# Patient Record
Sex: Male | Born: 1945
Health system: Southern US, Community
[De-identification: ages and names within clinical notes are randomized; demographics above are authoritative.]

## PROBLEM LIST (undated history)

## (undated) DIAGNOSIS — J869 Pyothorax without fistula: Secondary | ICD-10-CM

## (undated) DIAGNOSIS — M75100 Unspecified rotator cuff tear or rupture of unspecified shoulder, not specified as traumatic: Secondary | ICD-10-CM

## (undated) DIAGNOSIS — G479 Sleep disorder, unspecified: Secondary | ICD-10-CM

## (undated) DIAGNOSIS — J189 Pneumonia, unspecified organism: Secondary | ICD-10-CM

## (undated) DIAGNOSIS — M1990 Unspecified osteoarthritis, unspecified site: Secondary | ICD-10-CM

## (undated) HISTORY — PX: OTHER SURGICAL HISTORY: SHX169

## (undated) HISTORY — DX: Pyothorax without fistula: J86.9

## (undated) HISTORY — PX: LUNG SURGERY: SHX703

---
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
## 1999-06-22 ENCOUNTER — Encounter: Admission: RE | Admit: 1999-06-22 | Discharge: 1999-06-22 | Payer: Self-pay

## 1999-09-10 ENCOUNTER — Encounter: Payer: Self-pay | Admitting: Neurosurgery

## 1999-09-10 ENCOUNTER — Ambulatory Visit (HOSPITAL_COMMUNITY): Admission: RE | Admit: 1999-09-10 | Discharge: 1999-09-10 | Payer: Self-pay | Admitting: Neurosurgery

## 1999-09-30 ENCOUNTER — Encounter: Payer: Self-pay | Admitting: Neurosurgery

## 1999-10-02 ENCOUNTER — Encounter: Payer: Self-pay | Admitting: Neurosurgery

## 1999-10-02 ENCOUNTER — Inpatient Hospital Stay (HOSPITAL_COMMUNITY): Admission: RE | Admit: 1999-10-02 | Discharge: 1999-10-03 | Payer: Self-pay | Admitting: Neurosurgery

## 1999-10-23 ENCOUNTER — Encounter: Payer: Self-pay | Admitting: Neurosurgery

## 1999-10-23 ENCOUNTER — Encounter: Admission: RE | Admit: 1999-10-23 | Discharge: 1999-10-23 | Payer: Self-pay | Admitting: Neurosurgery

## 1999-12-28 ENCOUNTER — Encounter: Admission: RE | Admit: 1999-12-28 | Discharge: 1999-12-28 | Payer: Self-pay | Admitting: Neurosurgery

## 1999-12-28 ENCOUNTER — Encounter: Payer: Self-pay | Admitting: Neurosurgery

## 2006-08-31 ENCOUNTER — Ambulatory Visit (HOSPITAL_COMMUNITY): Admission: RE | Admit: 2006-08-31 | Discharge: 2006-09-01 | Payer: Self-pay | Admitting: Specialist

## 2008-08-11 ENCOUNTER — Inpatient Hospital Stay (HOSPITAL_COMMUNITY): Admission: EM | Admit: 2008-08-11 | Discharge: 2008-08-23 | Payer: Self-pay | Admitting: Emergency Medicine

## 2008-08-11 ENCOUNTER — Ambulatory Visit: Payer: Self-pay | Admitting: Internal Medicine

## 2008-08-11 ENCOUNTER — Ambulatory Visit: Payer: Self-pay | Admitting: Thoracic Surgery

## 2008-08-12 ENCOUNTER — Encounter (INDEPENDENT_AMBULATORY_CARE_PROVIDER_SITE_OTHER): Payer: Self-pay | Admitting: Emergency Medicine

## 2008-08-15 ENCOUNTER — Encounter: Payer: Self-pay | Admitting: Thoracic Surgery

## 2008-09-03 ENCOUNTER — Ambulatory Visit: Payer: Self-pay | Admitting: Thoracic Surgery

## 2008-09-03 ENCOUNTER — Encounter: Admission: RE | Admit: 2008-09-03 | Discharge: 2008-09-03 | Payer: Self-pay | Admitting: Thoracic Surgery

## 2008-09-26 ENCOUNTER — Ambulatory Visit: Payer: Self-pay | Admitting: Thoracic Surgery

## 2008-09-26 ENCOUNTER — Encounter: Admission: RE | Admit: 2008-09-26 | Discharge: 2008-09-26 | Payer: Self-pay | Admitting: Thoracic Surgery

## 2008-10-01 ENCOUNTER — Ambulatory Visit: Payer: Self-pay | Admitting: Internal Medicine

## 2008-10-01 DIAGNOSIS — J869 Pyothorax without fistula: Secondary | ICD-10-CM

## 2008-10-01 HISTORY — DX: Pyothorax without fistula: J86.9

## 2008-11-06 ENCOUNTER — Encounter: Admission: RE | Admit: 2008-11-06 | Discharge: 2008-11-06 | Payer: Self-pay | Admitting: Thoracic Surgery

## 2008-11-06 ENCOUNTER — Ambulatory Visit: Payer: Self-pay | Admitting: Thoracic Surgery

## 2009-07-30 HISTORY — PX: OTHER SURGICAL HISTORY: SHX169

## 2011-01-12 NOTE — Assessment & Plan Note (Signed)
OFFICE VISIT   OLAF, MESA R  DOB:  05-Dec-1945                                        November 06, 2008  CHART #:  [id number]   The patient came today for followup of his empyema.  His chest x-ray did  show some changes in the right base.  His incisions are well healed.  He  is doing well.  He is back to full activity.  Blood pressure was 113/71,  pulse 64, respirations 18, and sats were 99%.  I will release him back  to his medical doctor and see him again if he has any further pulmonary  problems.   Ines Bloomer, M.D.  Electronically Signed   DPB/MEDQ  D:  11/06/2008  T:  11/07/2008  Job:  [id number]

## 2011-01-12 NOTE — H&P (Signed)
NAMESHUAN, STATZER                ACCOUNT NO.:  [id number]   MEDICAL RECORD NO.:  [id number]          PATIENT TYPE:  INP   LOCATION:  6715                         FACILITY:  [facility]   PHYSICIAN:  Gaspar Garbe, M.D.DATE OF BIRTH:  November 12, 1945   DATE OF ADMISSION:  08/11/2008  DATE OF DISCHARGE:                              HISTORY & PHYSICAL   CHIEF COMPLAINT:  Shortness of breath, chest pain.   HISTORY OF PRESENT ILLNESS:  The patient is a 65 year old white male  with a very unremarkable health history indicates that he wishes to see  Dr. Clelia Croft for a physical in November and was told that he was in good  condition other than that he was overweight.  He had a chest x-ray at  that time which was normal as well.  The patient indicates that he is  having difficulty with chest pain and with some mucus production and  some low grade fevers and chills beginning on Wednesday, seemed to be  getting better as the week went on by just taking Tylenol, however, last  night he started having more significant chest pain and shortness of  breath which has been wrapping up over the course of the past day.  Upon  arrival at the emergency room, he was found to be laboring and short of  breath.  He was placed on 2 L of oxygen and a chest x-ray showed a  consolidated right lower lobe pneumonia with possibility of a left lower  lobe pneumonia as well.  I was asked to admit the patient for therapy.   He indicates that nobody at home has been sick.  No domestic animals and  no travel outside the area, although he is looking to go to [place] for  Christmas but will be driving.   ALLERGIES:  No known drug allergies.   MEDICATIONS:  Fiber, multivitamin, and fish oil.   PAST MEDICAL HISTORY:  Unremarkable.   PAST SURGICAL HISTORY:  The patient had multiple orthopedic procedures  including repair of a neck fracture in 1967, shoulder surgery in 1999,  vertebral fusion in 2001 and another back surgery in  2007.   SOCIAL HISTORY:  The patient lives at [employer] with his wife.  He  is a retired Runner, broadcasting/film/video.  He is married with two children who were grown in  house.  He has one dog.  He is a nonsmoker and nondrinker.   FAMILY HISTORY:  Mother died of old age.  Father died of emphysema.  He  has a brother who died of pulmonary fibrosis, another brother who is  alive.   REVIEW OF SYSTEMS:  The patient complains of fevers, chills, chest pain,  shortness of breath, dyspnea on exertion and cough.  Denies any  headache.  Indicates that he has some sore throat and has been quite  thirsty lately, dry mouth, denies any skin lesions.  Denies any  diarrhea, reflux symptoms or urinary symptoms.  All other systems are  negative on a 10-point scale.   CODE STATUS:  The patient is full code.   PHYSICAL EXAMINATION:  VITAL SIGNS:  Temperature 101.6, pulse 125,  respiratory rate 26, blood pressure 153/77, sating 93% on 2 L.  GENERAL:  The patient is ill-appearing and no acute distress but appears  labored when he is moved.  HEENT:  Normocephalic and atraumatic.  PERRLA.  EOMI.  ENT is within  normal limits excepting the oral mucosa is dry.  NECK:  Supple.  No lymphadenopathy, JVD or bruit appreciated.  HEART:  Tachycardic.  No murmur appreciated.  LUNGS:  The patient has decreased breath sounds of his right base and  slight crackles on the left base.  ABDOMEN:  Soft, nontender, normoactive bowel sounds, no  hepatosplenomegaly.  EXTREMITIES:  No clubbing, cyanosis or edema.  MUSCULOSKELETAL:  No joint deformities noted.  NEURO:  Oriented to person, place and time with 5/5 strength bilaterally  and no lateralizing signs.   Chest x-ray shows a consolidated right lower lobe pneumonia with a  question of early left lower lobe pneumonia.  White count 12.5,  hemoglobin 12.7, hematocrit 37.5, platelets 227, BUN and creatinine are  41 and 2.4 respectively.  Glucose elevated at 157.  Electrolytes   otherwise normal.   ASSESSMENT AND PLAN:  1. Right lower lobe pneumonia with possible left lower lobe pneumonia.      The patient has been started on Avelox p.o.  He has been started on      oxygen as well.  We will begin an incentive spirometer and      encouraged to cough up his mucus.  He will have another chest x-ray      in the morning which may show the left lower lobe pneumonia in      details.  He is being actively hydrated.  2. Acute renal insufficiency.  His creatinine was found to be normal      at his physical just 1 month ago.  We will aggressively hydrate the      patient as he is probably having considerable water loss even      though he indicates that he is drinking.  We will compare this with      tomorrow's laboratories and dose his medications accordingly.  3. Elevated CBG.  We will perform a hemoglobin A1c on the patient as      he was thought to be overweight but not like that on his last      office visit.  4. We will use Lovenox for deep venous thrombosis prophylaxis and      Protonix for gastrointestinal prophylaxis.      Gaspar Garbe, M.D.  Electronically Signed     RWT/MEDQ  D:  08/11/2008  T:  08/11/2008  Job:  [id number]   cc:   Kari Baars, M.D.

## 2011-01-12 NOTE — Op Note (Signed)
NAMEARUSH, Jeffrey [name]                ACCOUNT NO.:  [id number]   MEDICAL RECORD NO.:  [id number]          PATIENT TYPE:  INP   LOCATION:  2308                         FACILITY:  [facility]   PHYSICIAN:  Ines Bloomer, M.D. DATE OF BIRTH:  1946/06/21   DATE OF PROCEDURE:  08/15/2008  DATE OF DISCHARGE:                               OPERATIVE REPORT   PREOPERATIVE DIAGNOSIS:  Right chest empyema.   POSTOPERATIVE DIAGNOSIS:  Right chest empyema.   OPERATION PERFORMED:  Right thoracotomy and drainage of empyema with  decortication.   SURGEON:  Ines Bloomer, MD   FIRST ASSISTANT:  Coral Ceo, PA-C   ANESTHESIA:  General anesthesia.   After percutaneous insertion of all monitoring lines, the patient  underwent general anesthesia.  He was prepped and draped in the usual  sterile manner.  The patient was turned to the right lateral thoracotomy  position.  Two trocar sites were made in the anterior and posterior  axillary line at the seventh intercostal space, one was where the  previous chest tube site was.  A 0-degree scope was inserted.  The  patient had marked empyema with marked inflammation and exudate.  For  this reason, we proceeded right with a lateral thoracotomy over the  sixth intercostal space, partially dividing the latissimus, and  splitting the serratus in the sixth intercostal space and put 2 tubes of  right-angled.  We then freed the lung up off the diaphragm medially off  the mediastinum and laterally, freeing up large amount of inflammatory  exudate off the parietal pleura and off the diaphragmatic surface.  We  then did a decortication of the middle lobe, upper lobe, and lower lobe,  stripping off a thick peel off of the middle lobe, lower lobe, and upper  lobe with Guernsey forceps sharp and blunt dissection.  There was one  area in the middle lobe that appeared with the previous chest tube was  inserted was probably torn and we re-sutured that with 3-0 Vicryl  and  then there was another area that needed to have repaired with 3-0  Vicryl.  The area was irrigated copiously.  We placed 3 chest tubes, 2  to the trocar sites anterior and posteriorly, a straight 36 chest tube  and then between these two, a right-angle chest tube.  There were all  sutured in place with 0 silk.  The area was irrigated copiously and the  chest was closed with 4 pericostal drilling through the seventh rib and  passing around the sixth rib, #1 Vicryl in the muscle layer, 2-0 Vicryl  in the subcutaneous tissue, and Dermabond for the skin.  The patient was  returned to the recovery room in serious condition.      Ines Bloomer, M.D.  Electronically Signed     DPB/MEDQ  D:  08/15/2008  T:  08/16/2008  Job:  [id number]

## 2011-01-12 NOTE — Letter (Signed)
September 03, 2008   W. Buren Kos, MD  [address]  [place], [place] [address]   Re:  KIING, DEAKIN                DOB:  [birth year]   Dear Gala Romney:   I saw the patient back in the office today.  Chest x-ray shows improved  aeration at the right base with decrease in effusion.  His incisions are  well healed.  I removed his chest tube sutures.  He has finished his  antibiotics.  Overall, he is doing much improved.  Just now I realized  how sick he was when he was in the hospital.  We had a long discussion  of the cause of his empyema.  I told him to gradually increase his  activity.  I plan to see him back again in 3 weeks with a chest x-ray.   Ines Bloomer, M.D.  Electronically Signed   DPB/MEDQ  D:  09/03/2008  T:  09/04/2008  Job:  [id number]

## 2011-01-12 NOTE — Consult Note (Signed)
Jeffrey [name]                ACCOUNT NO.:  [id number]   MEDICAL RECORD NO.:  [id number]          PATIENT TYPE:  INP   LOCATION:  6715                         FACILITY:  [facility]   PHYSICIAN:  Kalman Shan, MD   DATE OF BIRTH:  May 09, 1946   DATE OF CONSULTATION:  08/12/2008  DATE OF DISCHARGE:                                 CONSULTATION   PULMONARY CONSULTATION:   CONSULT TIME:  9:45 a.m. to 10:30 a.m. August 12, 2008, a 45-minute  critical care consult.   REASON FOR CONSULTATION:  Right lower lobe pneumonia and effusion.   HISTORY OF PRESENT ILLNESS:  [name] is a pleasant 65 year old  previously healthy male who on August 07, 2008 began to experience a  high-grade fever and chills.  I do not know the level of the  temperature, but fever persisted all the way through Friday, August 09, 2008 and then defervesced.  He was then left with severe fatigue.  Subsequently, on Saturday August 10, 2008, he started experiencing  worsening of the of the right pleuritic chest pain that he had had all  along since onset of fevers.  The pleuritic chest and was significant  enough that he presented to the emergency room on Sunday, August 11, 2008 and got admitted.  In association with symptoms, he has been having  reddish-brown sputum that has been present since onset of fever and  chills on August 07, 2008, small amounts each time.  He is having  difficulty expectorating sputum because of his pain.  Also he has been  progressively short of breath to the extent that he is not able to  complete sentences at this time.   He denies hemoptysis edema, paroxysmal nocturnal dyspnea, chest pain,  weight loss or any other symptoms.   PAST MEDICAL HISTORY:  1. Previously healthy.  2. Prior sleep study was negative per history for sleep apnea.  3. Nonsmoker.   SOCIAL HISTORY:  He teaches in a driving school, so he could have been  exposed to kids with colds at this time,  but he does not specifically  remember any students with colds or respiratory symptoms.   REVIEW OF SYSTEMS:  As per history of present illness.  This is detailed  completely in the history of present illness and past history.   PAST MEDICAL HISTORY:  Significant for irritable bowel syndrome.   PAST SURGICAL HISTORY:  1. Status post neck surgery in 1966.  2. Shoulder surgery in 1998.   ALLERGIES:  NO KNOWN DRUG ALLERGIES.   MEDICATIONS:  None at home.   SOCIAL HISTORY:  Nonsmoker.  Social drinker of alcohol.  No history of  substance abuse.  Low risk for HIV   PHYSICAL EXAMINATION:  GENERAL:  Height 5 feet 11 inches, weight 220  pounds.  A moderately heavy-set man in some amount of distress lying in  bed.  Does not look comfortable.  VITAL SIGNS:  T-max since admission 99.4, pulse of 100, respiratory rate  documented as 20, but to my measurement it was 30, systolic blood  pressure of 137,  diastolic 85, saturating 95% on 4 liters nasal cannula.  RESPIRATORY:  In moderate respiratory distress, tachypneic.  Unable to  complete full sentences.  Not cyanotic but not using accessory muscles.  Does not have paradoxical breathing.  The air entry into the right lower  lobe is significantly diminished with stony dullness to percussion and  scattered wheeze.  Midway to the chest on the right side with bronchial  breath sounds suggesting the top of the pleural effusion is present.  The left side air entry is normal.  CARDIOVASCULAR:  Tachycardic, no murmurs.  Regular rate and rhythm.  HEENT:  He has oral hepatic lesions present.  This started 2 days ago.  ABDOMEN:  Obese, soft, nontender.  No organomegaly.  EXTREMITIES:  No cyanosis, no clubbing, no edema.  SKIN:  Intact.  MUSCULOSKELETAL:  Joints intact.   LABORATORY DATA:  Streptococcal pneumonia, urinary antigen is positive.   CBC on August 11, 2008 - white count was 12.5, and today on August 12, 2008, the white count continues  to be 12,200.  Hemoglobin 7.3,  platelets 207.   BMET - the significant abnormality here is that the BUN was 2.45 at  admission, August 11, 2008.  Now with hydration of 100 cc of saline an  hour, this serum creatinine has decreased to 1.86.   Blood cultures pending.  Urine cultures are pending.   Chest x-ray shows right-sided pleural effusion on the PA and lateral  view.  Today, the opacity is increased.   ASSESSMENT AND PLAN:  1. Classic story for pneumococcal pneumonia.  He has textbook story      for pneumococcal pneumonia, and this is confirmed by positive urine      antigen.  He is currently on day one of Avelox treatment.  2. Acute renal failure.  This is probably secondary to dehydration and      sepsis.  This has improved with gentle hydration from 2.45 to a      creatinine of 1.86.  3. No flu symptoms or prior cold to suggest a precipitating viral      infection.  However, this is still possible, and therefore a      preceding RSV, swine flu or a seasonal flu virus could have      precipitated this.  4. Oral herpetic lesions.  Again, classically associated pneumococcal      pneumonia.   PLAN:  1. Stat ABG.  2. Stat bedside ultrasound and thoracentesis.  If this shows empyema      or the tap is dry, then we should opt for a VATS pleurectomy.  3. Move to step-down intensive unit.  4. Change antibiotic from Avelox to ceftriaxone ICU dose and      azithromycin (outcomes with dual therapy is better).  5. Start Tamiflu for possible flu precipitating pneumococcal pneumonia  6. Check RSV nasal swab for seasonal flu antigen and swine flu H1N1      PCR.  7. For renal failure, continue with hydration.   Patient is critically ill.  Forty-five minutes spent in critical care  evaluation.  I have explained to the patient anticipated prognosis and  course.  He is surprised that he is so ill.      Kalman Shan, MD  Electronically Signed     MR/MEDQ  D:  08/12/2008  T:   08/12/2008  Job:  [id number]   cc:   Kari Baars, M.D.

## 2011-01-12 NOTE — H&P (Signed)
NAME:  Jeffrey [name], Jeffrey [name]                ACCOUNT NO.:  [id number]   MEDICAL RECORD NO.:  [id number]          PATIENT TYPE:  INP   LOCATION:  3316                         FACILITY:  [facility]   PHYSICIAN:  Ines Bloomer, M.D. DATE OF BIRTH:  [birth year]   DATE OF ADMISSION:  08/11/2008  DATE OF DISCHARGE:                              HISTORY & PHYSICAL   CHIEF COMPLAINT:  This 65 year old patient has been in good health until  he developed a right chest pain, low-grade temperature and this  continued the severe pain, he came to the emergency room with shortness  of breath was found to have a right lower lobe pneumonia with  parapneumonic effusion and questionable left lower lobe pneumonia.  He  was admitted and started on Zithromax, Rocephin, Tamiflu, and acyclovir.  He underwent a thoracentesis by Dr. Marchelle Gearing and then chest tube by Dr.  Marchelle Gearing.  CT scan showed a right lower lobe pneumonia with  parapneumonic effusion with evidence of loculations.  He is continued to  have a white count of  14,000 with increasing reaction on the right  chest on chest x-rays.  He was admitted on the August 11, 2008, and we  were asked to see him today on the August 14, 2008.   MEDICATIONS IN HOSPITAL:  1. Colace.  2. Heparin.  3. Tamiflu.  4. Ventolin.  5. Ambien.  6. Morphine.  7. Vicodin.  8. MiraLax.   He has no allergies.   PAST MEDICAL HISTORY:  Unremarkable.  He has had a repair of neck  fracture in the past in 1967, right shoulder surgery in 1999, and  vertebral effusion in 2001 and also in 2007.   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   FAMILY HISTORY:  Positive for pulmonary disease.  Negative for cancer.   REVIEW OF SYSTEMS:  CARDIAC:  No angina or atrial fibrillation.  PULMONARY:  See history of present illness.  GASTROINTESTINAL:  No  nausea, vomiting, or constipation.  GU:  No kidney disease.  No dysuria  or frequent urination.   VASCULAR:  No claudication, DVT, or TIA.  NEUROLOGIC:  No dizziness, headaches, blackout or seizures.  MUSCULOSKELETAL:  No arthritis, but see past medical history regarding  multiple back procedures.  PSYCHIATRIC:  No psychiatric illness.  HEMATOLOGIC:  No problems with bleeding or clotting disorders.   PHYSICAL EXAMINATION:  GENERAL:  He is a well-appearing Caucasian male  in no acute distress.  HEAD, EYES, EARS, NOSE AND THROAT:  Unremarkable.  NECK:  Surgical scars.  There is no supraclavicular adenopathy.  CHEST:  Decreased breath sounds on the right side.  HEART:  Regular sinus rhythm with no murmurs.  ABDOMEN:  Soft.  No hepatosplenomegaly.  Pulses are 2+.  There is no  clubbing or edema.  NEUROLOGICAL:  He is oriented x3.  Sensory and motor intact.  Cranial  nerves intact.   IMPRESSION:  1. Right lower lobe pneumonia with empyema.  2. Multiple back surgeries.   PLAN:  Right VATS with decortication.  Ines Bloomer, M.D.  Electronically Signed     DPB/MEDQ  D:  08/14/2008  T:  08/15/2008  Job:  [id number]

## 2011-01-12 NOTE — Discharge Summary (Signed)
NAMEMARQUAVIS, [name]                ACCOUNT NO.:  [id number]   MEDICAL RECORD NO.:  [id number]          PATIENT TYPE:  INP   LOCATION:  3301                         FACILITY:  [facility]   PHYSICIAN:  Kari Baars, M.D.  DATE OF BIRTH:  Oct 16, 1945   DATE OF ADMISSION:  08/11/2008  DATE OF DISCHARGE:  08/23/2008                               DISCHARGE SUMMARY   DISCHARGE DIAGNOSES:  1. Right chest empyema status post right thoracotomy and drainage with      decortication (August 15, 2008).  2. Right lower lobe community-acquired pneumonia.  3. Anemia.  4. Acute renal failure, resolved.  5. Atrial flutter, postoperatively, resolved.  6. Postoperative ileus, resolved.  7. Obesity.  8. Status post repair of neck fracture (8295), shoulder surgery      (1999), vertebral fusion (2001), and another back surgery (2007).   DISCHARGE MEDICATIONS:  1. Augmentin 875 mg b.i.d. x10 days.  2. Roxicodone 5 mg q.4-6 hours p.r.n. pain.  3. Multivitamin daily.  4. Fish oil daily.  5. Fiber daily.  6. Iron sulfate 325 mg b.i.d.   HOSPITAL PROCEDURES:  1. Right-sided thoracentesis (August 12, 2008), Dr. Marchelle Gearing, under      ultrasound guidance.  2. Right tube thoracostomy (August 13, 2008), by Dr. Marchelle Gearing.  3. Right sided VATS/thoracotomy with decortication (August 15, 2008)      by Dr. Edwyna Shell.  4. Postoperative ICU care.   HISTORY OF PRESENT ILLNESS:  For full details, please see dictated  history and physical.  Briefly, Jeffrey [name] is a relatively healthy 65-  year-old white male with history of obesity who presented to the  emergency department on August 11, 2008, with complaint of right-sided  chest pain, cough, and mucus production associated with fever and  chills.  When he presented to the emergency department, he was having  significant increase in shortness of breath and work of breathing.  Temperature was 101.6, pulse 125, respirations 26, blood pressure  153/77, and oxygen  saturation 93% of 2 L.  White blood count was 12.5  and BUN and creatinine of 41 and 2.4 respectively.  Chest x-ray showed a  right lower lobe infiltrate.  Given his clinical picture, he was  admitted for further management of severe right lower lobe community-  acquired pneumonia.   HOSPITAL COURSE:  The patient was admitted to a medical bed.  He was  placed on Avelox for empiric treatment of community-acquired pneumonia.  Urine strep pneumoniae antigen was positive, most consistent with  Streptococcus pneumoniae.  Despite this treatment, the patient continued  to have an increase in the right lower lobe consolidation and increased  work of breathing.  Chest x-ray showed increasing pleural effusion.  Pulmonary Critical Care was consulted and a thoracentesis was performed  on August 12, 2008, revealing exudative fluid.  A chest CT was  performed to further evaluate that showed dense right lower lobe  consolidation with moderate parapneumonic effusion and increased  mediastinal hilar lymph nodes most likely reactive.  Given his  persistent effusion, a right chest tube was placed on August 13, 2008,  by  Dr. Marchelle Gearing with 450 mL of purulent pleural fluid output.  Given  these findings, Dr. Edwyna Shell was consulted from Cardiothoracic Surgery.  His antibiotics were changed Avelox to Rocephin and azithromycin.  Tamiflu was covered for empiric influenza coverage.  Dr. Edwyna Shell  evaluated the patient and recommended VATS with decortication which was  performed on August 15, 2008, with findings consistent with empyema.  The patient progressed postoperatively under the postoperative care of  CT Surgery.  He was extubated on August 18, 2008.  He did have an  episode of paroxysmal atrial flutter postoperatively which resolved with  improvement in his pulmonary status.  He was briefly placed on  amiodarone, but this was discontinued shortly after he converted.  He  did have mild anemia, but did  not require transfusion.  He completed at  least 7 days of IV Rocephin and azithromycin and was transitioned to  p.o. Augmentin prior to discharge.  On the day of discharge (August 23, 2008), the patient was feeling much better and was felt stable for  discharge home by CT Surgery with close outpatient followup.   Discharge labs on August 22, 2008, CBC showed a white count of 8.2,  hemoglobin 9.9, platelets 615.  BMET on August 21, 2008, showed sodium  131, potassium 4.3, chloride 97, bicarb 26, BUN 15, creatinine 0.8,  glucose 104.  Intraoperative cultures from the empyema grew no  organisms.  Urine Legionella antibody negative.  Urine strep pneumoniae  antibody positive.  Hemoglobin A1c 5.5.  Pleural fluid LDH 2682.  Pleural fluid protein 3.5.  Pleural fluids white blood count 2575 with  85% segs and 3% lymphs (pleural fluid analysis from August 12, 2008).   DISCHARGE INSTRUCTIONS:  The patient was instructed to call if he has  increasing chest pain, shortness of breath, fever, chills, or purulent  sputum.   HOSPITAL FOLLOWUP:  He will follow up with Dr. Edwyna Shell within 2 weeks  following discharge.  The sutures will be removed at that time.  He will  follow up with Dr. Clelia Croft within 2 weeks as well.   DISPOSITION:  To home.   DISCHARGE DIET:  Regular diet.      Kari Baars, M.D.  Electronically Signed     WS/MEDQ  D:  10/31/2008  T:  10/31/2008  Job:  [id number]

## 2011-01-12 NOTE — Assessment & Plan Note (Signed)
OFFICE VISIT   HUDSYN, CHAMPINE R  DOB:  1946-03-08                                        September 26, 2008  CHART #:  [id number]   The patient returns today and he is doing remarkably better.  His blood  pressure was 118/76, pulse 76, respirations 18, and sats were 97%.  Incisions were well healed.  He is going to [place] next week.  I told  him, he can go back to work and I will see him again if he has any  future problems.   Ines Bloomer, M.D.  Electronically Signed   DPB/MEDQ  D:  09/26/2008  T:  09/26/2008  Job:  [id number]

## 2011-01-15 NOTE — Op Note (Signed)
NAMERASHED, EDLER                ACCOUNT NO.:  [id number]   MEDICAL RECORD NO.:  [id number]          PATIENT TYPE:  AMB   LOCATION:  DAY                          FACILITY:  [facility]   PHYSICIAN:  Jene Every, M.D.    DATE OF BIRTH:  1946-08-09   DATE OF PROCEDURE:  08/31/2006  DATE OF DISCHARGE:                               OPERATIVE REPORT   PREOPERATIVE DIAGNOSIS:  Spinal stenosis, synovial cyst, L5-S1, left.   POSTOPERATIVE DIAGNOSIS:  Spinal stenosis, synovial cyst, L5-S1, left.   PROCEDURE PERFORMED:  Central decompression of 4-5, 1, lateral recess  decompression at 4-5, foraminotomies of L5.   ANESTHESIA:  General.   ASSISTANT:  Roma Schanz, P.A.   BRIEF HISTORY AND INDICATIONS:  This is a 65 year old with left lower  extremity radicular pain, slight EHL weakness.  __________  tension  signs. Predominantly left lower extremity pain.  He had an MRI which  indicated significant lateral recess stenosis multifactorial at 4-5,  facet hypertrophy, possible synovial cyst.  He had also lateral recess  on the other side which is asymptomatic.  He was indicated for a  decompression mainly at 4-5 on the left.  We discussed the possibility  of requiring central if the anatomy so dictated.  We discussed the risks  and benefits including bleeding, infection, damage to neurovascular  structures, CSF leak, __________  disease, need for fusion in the  future, anesthetic complications, etc.   TECHNIQUE:  Patient supine position after adequate induction of general  anesthesia and two grams of Kefzol he was placed prone on the [place]  frame.  All bony prominences were well-padded.  The lumbar region was  prepped and draped in the usual sterile fashion.  Two 18 gauge spinal  needles were utilized to localize the 4-5 interspace and confirmed with  x-ray.  An incision was made from the spinous process of 4-5.  Subcutaneous tissue was dissected.  Electrocautery utilized to achieve  hemostasis.  The dorsal lumbar fascia identified and divided in line  with the skin incision. The paraspinous muscle elevated from the lamina  of 4 and 5.  A Colver retractor was placed.  The operating microscope  was draped and brought onto the surgical field.  A Penfield 4 was placed  in the interlaminar space and this was confirmed by x-ray.   We incised the dorsal lumbar fascia on the left and elevated the  paraspinous musculature on the left.  It was noted that there was a  nearly absent inner laminar window and facet hypertrophy as evidenced by  the x-ray as well from the AP.  I felt at this point in time it would be  prudent to perform a central decompression given his central stenosis as  well.  I incised the dorsal lumbar fascia on the contralateral side and  placed a McCullough retractor and a Penfield and obtained the x-ray to  confirm the 4-5 space.   I then removed the partial spinous process of 4 and 5 and the  interspinous ligament and performed hemilaminotomies of the cephalad  edge of 5 first utilizing a  straight curet and a 2 mm Kerrison and then  a 3 mm Kerrison to perform a hemilaminotomy on the caudad edge of 4 as  well.  We had brought the operating microscope and brought it on the  surgical field.  Moved the ligamentum flavum essentially into the left  partially from the right.  It was noted severe lateral recess stenosis  multifactorial and a left facet hypertrophy.  There was synovium within  the facet that also appeared to be part of the pathology.  After  identifying the nerve root in the foramen we gently protected the nerve  medially and I decompressed the lateral recess of the medial border of  the pedicle with a 2 mm Kerrison.  There was a fairly significant  portion of the facet of the superior articulating process of 5 that was  involved in the pathology.  This was consistent with that seen on the  MRI.  This was decompressed with a 2 mm Kerrison as well.   There was an  epidural venous plexus and electrocautery was utilized to achieve  hemostasis.   We then placed an AccuStick probe in the foramen of 4 and 5 and found to  be widely patent.  There was no disc herniation.  I checked the axilla  of the root beneath the thecal sac out the 4 foramen and the 4 foramen.  There was no residual compressive pathology and at least 1 cm of good  excursion of the 5 very medial to the pedicle without difficulty.   Next, we used electrocautery to achieve meticulous hemostasis and placed  bone wax on the cancellous surfaces.  I irrigated it copiously.  I  placed a very small piece of Gelfoam on the lateral recess.  I then  removed the [facility] retractor and inspection revealed just prior to  this no evidence of CSF leak or retracted bleeding.  The McCullough  retractor was removed.  I inspected the paraspinous muscles and used  bipolar cautery to achieve hemostasis.  I placed a drain and a small  Hemovac brought out through a lateral stab wound in the skin.  I  repaired the fascia with #1 Vicryl interrupted figure-of-eight sutures  loosely and the subcutaneous tissue reapproximated with 2-0 Vicryl  sutures.  The skin was reapproximated with staples.  The wound was  dressed sterilely.  He was placed supine on a hospital bed, extubated  without difficulty and transported to the recovery room in satisfactory  condition.   The patient tolerated the procedure well with no complications.  Blood  loss 50 mL.  Assistant was [employer].      Jene Every, M.D.  Electronically Signed     JB/MEDQ  D:  08/31/2006  T:  08/31/2006  Job:  [id number]

## 2011-01-15 NOTE — Discharge Summary (Signed)
[place]. [facility] [place]  Patient:    Jeffrey [name]                          MRN: [id number] Adm. Date:  [id number] Disc. Date: [id number] Attending:  Josie Saunders Dictator:   Mena Goes Franky Macho, M.D.                           Discharge Summary  ADMITTING DIAGNOSIS: 1.  Foraminal disk, right C6-7. 2.  Right C7 radiculopathy.  DISCHARGE DIAGNOSIS: 1.  Foraminal disk, right C6-7. 2.  Right C7 radiculopathy.  PROCEDURES: 1.  Anterior cervical diskectomy and arthrodesis, C6-7 with Atlantis plating     and allograft.  COMPLICATIONS:  None.  DISCHARGE STATUS:   Alive and well.  HISTORY OF PRESENT ILLNESS:  The patient is a 65 year old individual who presented to Dr. Maeola Harman with weakness in his right upper extremity.  Cervical myelogram showed a foraminal disk at C6-7 on the right side.  It was recommended and the patient agreed to undergo an anterior cervical diskectomy and fusion.  HOSPITAL COURSE:  He was admitted and taken to the operating room for an uncomplicated procedure on October 02, 1999.  Atlantis plating and allograft was used.  At the time of discharge his wound is clean and dry without signs of infection.  He has 5/5 strength in the upper extremities.  He has been able to void, ambulate, and tolerate a regular diet.  DISCHARGE MEDICATIONS: 1.  Vicodin for pain.  DISPOSITION:  He was given instructions for no driving, lifting, bending, or twisting.  He will have a return appointment to see Dr. Venetia Maxon in approximately wo weeks. DD:  10/02/99 TD:  10/04/99 Job: [id number]

## 2011-01-15 NOTE — Op Note (Signed)
[place]. [facility]  Patient:    Jeffrey [name]                          MRN: [id number] Proc. Date: 10/02/99 Adm. Date:  [id number] Disc. Date: [id number] Attending:  Josie Saunders                           Operative Report  PREOPERATIVE DIAGNOSIS:  Herniated disk, cervical spondylosis, degenerative disk disease and cervical radiculopathy, C6-7.  POSTOPERATIVE DIAGNOSIS:  Herniated disk, cervical spondylosis, degenerative disk disease and cervical radiculopathy, C6-7.  OPERATION PERFORMED:  Anterior diskectomy and fusion C6-7 with allograft and anterior cervical plate.  SURGEON:  Pietro Bonura. Venetia Maxon, M.D.  ASSISTANTMarland Kitchen  Mena Goes. Franky Macho, M.D.  ANESTHESIA:  General endotracheal.  ESTIMATED BLOOD LOSS:  Less than 100 cc.  COMPLICATIONS:  None.  DISPOSITION:  Recovery.  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 He then more recently developed herniated disk at the C6-7 level with significant C7 radiculopathy.  He did not improve with conservative management and it was elected to take him to surgery for anterior cervical diskectomy and fusion procedure.  DESCRIPTION OF PROCEDURE:  The patient was brought to the operating room. Following the satisfactory and uncomplicated induction of general endotracheal anesthesia, and placement of intravenous lines, he was placed in a supine position on the operating table.  The neck was placed in slight extension on a horseshoe  headholder.  He was placed in 10 pounds of halter traction.  The anterior neck as then prepped and draped in the usual sterile fashion.  The area of planned incision was infiltrated with 0.25% Marcaine, 0.5% lidocaine, 1:200,000 epinephrine. Incision was made over what was felt to be the C6-7 level in transverse fashion, carried through platysmal layer exposing  the anterior border of the sternocleidomastoid muscle.  Blunt dissection was then performed keeping the carotid sheath lateral, trachea and esophagus medial, exposing the anterior cervical spine.  A spinal needle was then placed at what was felt to be the C6-7 level; however, multiple x-rays failed to visualize this because of the patients extremely large stature and thick neck.  Consequently an additional needle was placed at the C5-6 level and x-ray did visualize this.  Following identification of the correct level, the disk space was then incised with the 15 blade and disk material was removed in a piecemeal fashion.  The end plates were decorticated using a variety of microcurets and subsequently, the Midas Rex drill with A-2 bur. Using the microscope to facilitate visualization, the uncinate spurs of C7 were  drilled down.  There was a central to right-sided disk herniation which was removed.  The posterior longitudinal ligament was incised with an arachnoid knife and removed in piecemeal fashion.  The right C7 nerve root was decompressed of uncinate spur and of overhanging C6 spur and soft disk material was removed directly overlying the nerve root.  The nerve root was well decompressed as it extended out the neural foramen.  Attention was then turned to the left side where similar decompression was performed.  Hemostasis was assured with Gelfoam soaked in thrombin.  A iliac crest allograft bone graft was then fashioned to a depth of 15 mm, a thickness of 7 mm and this was placed in the disk space and countersunk  appropriately.  The self-retaining retractor was removed.  The microscope was taken out of the field.  The weight was taken off of halter traction.  A 27.5 mm Atlantis anterior cervical plate was then affixed to the anterior cervical spine with two 13 x 4 mm fixed angle screws at C6, two 13 x 4 mm fixed angle screws at C7, locking mechanisms were engaged.  All  screws had excellent purchase.  Because of the inability to visualize this level on localizing x-rays, no further x-rays were obtained.  The wound was then irrigated with bacitracin saline.  Soft tissues were inspected and found to be in good repair.  The platysmal layer was reapproximated with 3-0 Vicryl interrupted inverted sutures.  Skin edges were reapproximated with running 4-0 Vicryl subcuticular stitch.  The wound was dressed with benzoin, Steri-Strips, Telfa gauze and tape.  The patient was extubated in the operating  room and taken to recovery room in stable and satisfactory condition having tolerated this operation well. DD:  10/02/99 TD:  10/03/99 Job: [id number]

## 2011-01-15 NOTE — H&P (Signed)
[place]. [facility]  Patient:    Jeffrey [name]                          MRN: [id number] Adm. Date:  [id number] Attending:  Josie Saunders                         History and Physical  REASON FOR ADMISSION:  Herniated cervical disk.  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 Fayrene Fearing for neurosurgical consultation regarding right arm pain and weakness.  I initially aw him on July 27, 1999.  Jeffrey [name] previously broke his neck while playing football in 1966, and said he was paralyzed for five days.  He had a fusion of C3-C5 levels done posteriorly.  He said that after 49 days he got better and was walking.  He has noted stiffness getting up and that he does have increased reflexes, but he said he has made a full recovery, and he was able to play basketball and water ski after that.  He said that, starting in mid August, he as felt pain in his right arm and noted difficulty lifting his arm.  He said he has increased pain in his neck going into his right arm, particularly with sitting. He also notes weakness in his right arm, and notes that he has to sleep with his right arm above his head, but he said that it is improving.  He underwent physical therapy at [facility] for three weeks, and this included traction and rotation of his head, and he said that he thinks it is helping him.  He denied any left upper extremity complaints or any lower extremity complaints, other than some occasional pain down his left leg with standing.  He denies any bowel or bladder dysfunction, other than irritable bowel syndrome, says that this is not a recent change.  He does feel that he is getting better, but still notes some significant pain.  He says it is not bothering him as much as when he sleeps. e also notes that he is doing better with  driving, having less severe pain into his elbow.  FAMILY HISTORY:  Both parents are deceased, cause unspecified.  PAST MEDICAL HISTORY:  Significant for irritable bowel syndrome; neck surgery, 1966; shoulder surgery, 1998.  ALLERGIES:  No known drug allergies.  MEDICATIONS:  He takes Naprosyn 375 mg daily.  SOCIAL HISTORY:  He is nonsmoker and social drinker of alcohol, no history of substance abuse.  HEIGHT AND WEIGHT:  He is 5 feet 11 inches tall, 222 pounds.  REVIEW OF SYSTEMS:  Detailed review of systems sheet was reviewed with the patient. Pertinent positives are arm pain, which he describes as steady, aching pain from his shoulder to his wrist.  DIAGNOSTIC STUDIES:  Jeffrey [name] presents with a cervical spine MRI which was obtained at [facility] on June 22, 1999, which shows a right-sided and central disk  herniation at C6-7 with bilateral ______ hypertrophy and disk herniation causing contact with his cervical spinal cord.  At the C4-5 and C5-6 levels, it appears to be significantly obscured by metallic artifacts and posterior cervical wire. His plain x-rays showed post surgical changes of fusion involving the C3, C4, and C5 vertebral bodies, and fusion of the spinous processes of C4 and C5 with wire struts  or with wire sutures.  There is some narrowing of the C2-3 neural foramen on the left.  PHYSICAL EXAMINATION:  GENERAL:  Jeffrey [name] is a pleasant, moderately uncomfortable middle-aged white male.  HEENT:  Reveals no carotid bruits, no masses.  CHEST:  Clear to auscultation.  HEART:  Regular rate and rhythm without murmur.  ABDOMEN:  Soft, nontender, active bowel sounds, no hepatosplenomegaly appreciated.  EXTREMITIES:  No edema, clubbing, or cyanosis in lower extremities.  Intact pedal pulses.  MUSCULOSKELETAL:  Reveals a healed posterior cervical incision.  He has mild reduction of range of motion of flexion and extension of his neck.  He  has significant pain turning his head to the right.  Reproduction of radicular pain  into his right arm turning his head to the right.  He has negative Lhermittes sign with axial compression, negative Spurling maneuver turning his head to the left.  NEUROLOGIC:  The pupils are equal, round and reactive to light.  He has left esotropia on up gaze and down gaze, and diplopia on up and down gaze.  His lateral gaze is normal.  Facial sensation and facial motor intact and symmetric. Hearing is intact to finger rub.  Palate is upgoing.  Shoulder shrug is symmetric. Tongue is midline and mobile.  Upper extremity strength is full in all motor groups, bilaterally symmetric, with the exception of the right triceps at 4-/5 and right wrist flexion at 4-/5.  Lower extremity strength is full in all motor groups, bilaterally symmetric.  He is diffusely hyperreflexic in his upper and lower extremities at 3 in the biceps and triceps, positive Hoffmanns signs bilaterally, 3 at the knees, 3 at the ankles.  Great toes are downgoing to plantar stimulation. He notes decreased pin sensation in his third through fifth digits on the right, otherwise intact sensation.  Cerebellar testing is normal.  IMPRESSION:  Jeffrey [name] is a 65 year old man with right triceps weakness and 6-7 disk herniation on the right with improving pain, but with significant weakness.  RECOMMENDATIONS:  I reviewed his findings with him and his studies.  I told him  that if he needed surgery, he would need to undergo a cervical myelogram and post myelographic CT to confirm whether there is any disease at the C5-6 level, as symptoms are clearly coming from the C6-7 level.  Since he was improving, it was initially felt that we would continue with conservative management and reassess him after that.  Patient returned on September 04, 1999, said he was no better with traction, continues to have significant weakness, and therefore  it was elected o go ahead with a cervical myelogram.  The patient then returned on September 22, 1999 following a cervical myelogram.  This re-demonstrated the C6-7 disk herniation n the right, and appears to be both hard and soft disk herniation.  This was,  incidentally, missed by the radiologist.  It does, however, clarify the C5-6 level, which appears to be in reasonably good shape, as this was not seen on prior MRI  because of metallic artifact.  I have recommended, based on these imaging studies, that Jeffrey [name] undergo anterior cervical diskectomy and fusion at C6-7 with allograft bone grafting and anterior cervical plating.  We went over the exact nature of the surgical procedure, as well attendant risks, including but not limited to, bleeding, infection, damage to nerves and vessels, failure to relieve pain, weakness, paralysis, recurrent laryngeal nerve injury with resultant hoarseness of voice, damage to the esophagus, damage to trachea,  damage to jugular vein or artery, stroke, or death.  He understands these risks, and wishes to proceed with surgery.  He also understands the significant potential that he may develop degenerative disease or disk herniation at the C5-6 level, which would require surgery at a later date.  I quoted him an approximately 25-30% chance that over the next 5-10 years, he would develop degenerative changes at that level, ut also explained there was no way to come up with an accurate number for that. He is at increased risk of degenerative disease at that level because he had fusion from his prior fracture at C3-C5 levels and will now have a fusion at C6-7, which will leave the intervening level of C5-6 bearing significant stresses in his neck. However, since it can be avoided, surgery at C5-6 level will likely preserve range of motion of his neck and prevent him from having an excessively stiff neck. Surgery was set up for February 2. DD:   10/02/99 TD:  10/02/99 Job: [id number]

## 2011-06-04 LAB — BASIC METABOLIC PANEL
BUN: 15 mg/dL (ref 6–23)
BUN: 17 mg/dL (ref 6–23)
BUN: 18 mg/dL (ref 6–23)
BUN: 35 mg/dL — ABNORMAL HIGH (ref 6–23)
BUN: 41 mg/dL — ABNORMAL HIGH (ref 6–23)
CO2: 22 mEq/L (ref 19–32)
CO2: 23 mEq/L (ref 19–32)
CO2: 25 mEq/L (ref 19–32)
CO2: 26 mEq/L (ref 19–32)
CO2: 27 mEq/L (ref 19–32)
CO2: 29 mEq/L (ref 19–32)
Calcium: 7.1 mg/dL — ABNORMAL LOW (ref 8.4–10.5)
Calcium: 7.7 mg/dL — ABNORMAL LOW (ref 8.4–10.5)
Calcium: 8 mg/dL — ABNORMAL LOW (ref 8.4–10.5)
Calcium: 8.1 mg/dL — ABNORMAL LOW (ref 8.4–10.5)
Calcium: 8.1 mg/dL — ABNORMAL LOW (ref 8.4–10.5)
Calcium: 8.1 mg/dL — ABNORMAL LOW (ref 8.4–10.5)
Chloride: 101 mEq/L (ref 96–112)
Chloride: 101 mEq/L (ref 96–112)
Chloride: 101 mEq/L (ref 96–112)
Chloride: 102 mEq/L (ref 96–112)
Chloride: 103 mEq/L (ref 96–112)
Chloride: 97 mEq/L (ref 96–112)
Chloride: 99 mEq/L (ref 96–112)
Creatinine, Ser: 0.79 mg/dL (ref 0.4–1.5)
Creatinine, Ser: 0.87 mg/dL (ref 0.4–1.5)
Creatinine, Ser: 0.9 mg/dL (ref 0.4–1.5)
Creatinine, Ser: 0.97 mg/dL (ref 0.4–1.5)
Creatinine, Ser: 1.23 mg/dL (ref 0.4–1.5)
Creatinine, Ser: 1.86 mg/dL — ABNORMAL HIGH (ref 0.4–1.5)
GFR calc Af Amer: 45 mL/min — ABNORMAL LOW (ref >60)
GFR calc Af Amer: >60 mL/min (ref >60)
GFR calc Af Amer: >60 mL/min (ref >60)
GFR calc Af Amer: >60 mL/min (ref >60)
GFR calc Af Amer: >60 mL/min (ref >60)
GFR calc Af Amer: >60 mL/min (ref >60)
GFR calc Af Amer: >60 mL/min (ref >60)
GFR calc Af Amer: >60 mL/min (ref >60)
GFR calc non Af Amer: >60 mL/min (ref >60)
GFR calc non Af Amer: >60 mL/min (ref >60)
GFR calc non Af Amer: >60 mL/min (ref >60)
GFR calc non Af Amer: >60 mL/min (ref >60)
GFR calc non Af Amer: >60 mL/min (ref >60)
Glucose, Bld: 104 mg/dL — ABNORMAL HIGH (ref 70–99)
Glucose, Bld: 104 mg/dL — ABNORMAL HIGH (ref 70–99)
Glucose, Bld: 157 mg/dL — ABNORMAL HIGH (ref 70–99)
Glucose, Bld: 99 mg/dL (ref 70–99)
Potassium: 3.5 mEq/L (ref 3.5–5.1)
Potassium: 3.8 mEq/L (ref 3.5–5.1)
Potassium: 3.9 mEq/L (ref 3.5–5.1)
Potassium: 4.1 mEq/L (ref 3.5–5.1)
Potassium: 4.2 mEq/L (ref 3.5–5.1)
Sodium: 132 mEq/L — ABNORMAL LOW (ref 135–145)
Sodium: 133 mEq/L — ABNORMAL LOW (ref 135–145)
Sodium: 135 mEq/L (ref 135–145)
Sodium: 135 mEq/L (ref 135–145)

## 2011-06-04 LAB — BLOOD GAS, ARTERIAL
Bicarbonate: 20.7 mEq/L (ref 20.0–24.0)
Patient temperature: 98.6
TCO2: 21.7 mmol/L (ref 0–100)
pCO2 arterial: 32.5 mmHg — ABNORMAL LOW (ref 35.0–45.0)
pH, Arterial: 7.42 (ref 7.350–7.450)
pO2, Arterial: 71.9 mmHg — ABNORMAL LOW (ref 80.0–100.0)

## 2011-06-04 LAB — POCT I-STAT 3, ART BLOOD GAS (G3+)
Bicarbonate: 23.9 mEq/L (ref 20.0–24.0)
O2 Saturation: 95 %
Patient temperature: 98.6
TCO2: 25 mmol/L (ref 0–100)
pCO2 arterial: 39 mmHg (ref 35.0–45.0)
pCO2 arterial: 40.3 mmHg (ref 35.0–45.0)
pH, Arterial: 7.395 (ref 7.350–7.450)
pO2, Arterial: 84 mmHg (ref 80.0–100.0)

## 2011-06-04 LAB — CBC
HCT: 30 % — ABNORMAL LOW (ref 39.0–52.0)
HCT: 32.5 % — ABNORMAL LOW (ref 39.0–52.0)
HCT: 34.8 % — ABNORMAL LOW (ref 39.0–52.0)
HCT: 37.5 % — ABNORMAL LOW (ref 39.0–52.0)
Hemoglobin: 10.1 g/dL — ABNORMAL LOW (ref 13.0–17.0)
Hemoglobin: 10.9 g/dL — ABNORMAL LOW (ref 13.0–17.0)
Hemoglobin: 11.4 g/dL — ABNORMAL LOW (ref 13.0–17.0)
Hemoglobin: 12.1 g/dL — ABNORMAL LOW (ref 13.0–17.0)
Hemoglobin: 12.7 g/dL — ABNORMAL LOW (ref 13.0–17.0)
Hemoglobin: 9.2 g/dL — ABNORMAL LOW (ref 13.0–17.0)
Hemoglobin: 9.5 g/dL — ABNORMAL LOW (ref 13.0–17.0)
MCHC: 33.5 g/dL (ref 30.0–36.0)
MCHC: 33.6 g/dL (ref 30.0–36.0)
MCHC: 33.8 g/dL (ref 30.0–36.0)
MCHC: 33.8 g/dL (ref 30.0–36.0)
MCHC: 34.1 g/dL (ref 30.0–36.0)
MCHC: 34.3 g/dL (ref 30.0–36.0)
MCHC: 34.6 g/dL (ref 30.0–36.0)
MCHC: 34.8 g/dL (ref 30.0–36.0)
MCV: 91.4 fL (ref 78.0–100.0)
MCV: 92.2 fL (ref 78.0–100.0)
MCV: 92.5 fL (ref 78.0–100.0)
MCV: 92.8 fL (ref 78.0–100.0)
MCV: 93.3 fL (ref 78.0–100.0)
MCV: 93.4 fL (ref 78.0–100.0)
MCV: 93.6 fL (ref 78.0–100.0)
MCV: 93.8 fL (ref 78.0–100.0)
MCV: 94.1 fL (ref 78.0–100.0)
Platelets: 207 10*3/uL (ref 150–400)
Platelets: 227 10*3/uL (ref 150–400)
Platelets: 261 10*3/uL (ref 150–400)
Platelets: 422 10*3/uL — ABNORMAL HIGH (ref 150–400)
Platelets: 423 10*3/uL — ABNORMAL HIGH (ref 150–400)
Platelets: 615 10*3/uL — ABNORMAL HIGH (ref 150–400)
RBC: 3.01 MIL/uL — ABNORMAL LOW (ref 4.22–5.81)
RBC: 3.06 MIL/uL — ABNORMAL LOW (ref 4.22–5.81)
RBC: 3.18 MIL/uL — ABNORMAL LOW (ref 4.22–5.81)
RBC: 3.21 MIL/uL — ABNORMAL LOW (ref 4.22–5.81)
RBC: 3.24 MIL/uL — ABNORMAL LOW (ref 4.22–5.81)
RBC: 3.48 MIL/uL — ABNORMAL LOW (ref 4.22–5.81)
RBC: 3.52 MIL/uL — ABNORMAL LOW (ref 4.22–5.81)
RBC: 3.61 MIL/uL — ABNORMAL LOW (ref 4.22–5.81)
RBC: 3.76 MIL/uL — ABNORMAL LOW (ref 4.22–5.81)
RDW: 13 % (ref 11.5–15.5)
RDW: 13.3 % (ref 11.5–15.5)
RDW: 13.5 % (ref 11.5–15.5)
RDW: 13.5 % (ref 11.5–15.5)
RDW: 13.5 % (ref 11.5–15.5)
RDW: 13.9 % (ref 11.5–15.5)
WBC: 11.3 10*3/uL — ABNORMAL HIGH (ref 4.0–10.5)
WBC: 11.4 10*3/uL — ABNORMAL HIGH (ref 4.0–10.5)
WBC: 11.5 10*3/uL — ABNORMAL HIGH (ref 4.0–10.5)
WBC: 12.2 10*3/uL — ABNORMAL HIGH (ref 4.0–10.5)
WBC: 16 10*3/uL — ABNORMAL HIGH (ref 4.0–10.5)
WBC: 21 10*3/uL — ABNORMAL HIGH (ref 4.0–10.5)
WBC: 8.2 10*3/uL (ref 4.0–10.5)

## 2011-06-04 LAB — CROSSMATCH
ABO/RH(D): A NEG
Antibody Screen: NEGATIVE

## 2011-06-04 LAB — BODY FLUID CELL COUNT WITH DIFFERENTIAL
Eos, Fluid: 0 %
Neutrophil Count, Fluid: 85 % — ABNORMAL HIGH (ref 0–25)

## 2011-06-04 LAB — COMPREHENSIVE METABOLIC PANEL
BUN: 16 mg/dL (ref 6–23)
Calcium: 7.2 mg/dL — ABNORMAL LOW (ref 8.4–10.5)
Creatinine, Ser: 0.95 mg/dL (ref 0.4–1.5)
Glucose, Bld: 97 mg/dL (ref 70–99)
Sodium: 134 mEq/L — ABNORMAL LOW (ref 135–145)
Total Protein: 4 g/dL — ABNORMAL LOW (ref 6.0–8.3)

## 2011-06-04 LAB — GLUCOSE, CAPILLARY
Glucose-Capillary: 102 mg/dL — ABNORMAL HIGH (ref 70–99)
Glucose-Capillary: 102 mg/dL — ABNORMAL HIGH (ref 70–99)
Glucose-Capillary: 107 mg/dL — ABNORMAL HIGH (ref 70–99)
Glucose-Capillary: 109 mg/dL — ABNORMAL HIGH (ref 70–99)
Glucose-Capillary: 113 mg/dL — ABNORMAL HIGH (ref 70–99)
Glucose-Capillary: 115 mg/dL — ABNORMAL HIGH (ref 70–99)
Glucose-Capillary: 142 mg/dL — ABNORMAL HIGH (ref 70–99)
Glucose-Capillary: 86 mg/dL (ref 70–99)
Glucose-Capillary: 93 mg/dL (ref 70–99)

## 2011-06-04 LAB — LEGIONELLA ANTIGEN, URINE

## 2011-06-04 LAB — PROTIME-INR: Prothrombin Time: 16.1 seconds — ABNORMAL HIGH (ref 11.6–15.2)

## 2011-06-04 LAB — ANAEROBIC CULTURE

## 2011-06-04 LAB — PROTEIN, BODY FLUID: Total protein, fluid: 3.5 g/dL

## 2011-06-04 LAB — URINALYSIS, ROUTINE W REFLEX MICROSCOPIC
Bilirubin Urine: NEGATIVE
Hgb urine dipstick: NEGATIVE
Nitrite: NEGATIVE
Protein, ur: 100 mg/dL — AB
Specific Gravity, Urine: 1.022 (ref 1.005–1.030)
Urobilinogen, UA: 1 mg/dL (ref 0.0–1.0)

## 2011-06-04 LAB — AMYLASE, BODY FLUID: Amylase, Fluid: 42 U/L

## 2011-06-04 LAB — GRAM STAIN

## 2011-06-04 LAB — PROTEIN, URINE, RANDOM: Total Protein, Urine: 91 mg/dL

## 2011-06-04 LAB — BODY FLUID CULTURE
Culture: NO GROWTH
Culture: NO GROWTH

## 2011-06-04 LAB — AFB CULTURE WITH SMEAR (NOT AT ARMC): Acid Fast Smear: NONE SEEN

## 2011-06-04 LAB — FUNGUS CULTURE W SMEAR
Fungal Smear: NONE SEEN
Fungal Smear: NONE SEEN

## 2011-06-04 LAB — RETICULOCYTES
RBC.: 3.23 MIL/uL — ABNORMAL LOW (ref 4.22–5.81)
Retic Count, Absolute: 35.5 10*3/uL (ref 19.0–186.0)

## 2011-06-04 LAB — CULTURE, BLOOD (ROUTINE X 2)
Culture: NO GROWTH
Culture: NO GROWTH

## 2011-06-04 LAB — CULTURE, RESPIRATORY W GRAM STAIN: Culture: NO GROWTH

## 2011-06-04 LAB — CREATININE, URINE, RANDOM: Creatinine, Urine: 154.2 mg/dL

## 2011-06-04 LAB — GLUCOSE, SEROUS FLUID: Glucose, Fluid: 89 mg/dL

## 2011-06-04 LAB — URINE MICROSCOPIC-ADD ON

## 2011-06-04 LAB — LACTATE DEHYDROGENASE, PLEURAL OR PERITONEAL FLUID: LD, Fluid: 2682 U/L — ABNORMAL HIGH (ref 3–23)

## 2011-06-04 LAB — MISCELLANEOUS TEST

## 2011-06-04 LAB — IRON AND TIBC: Iron: <10 ug/dL — ABNORMAL LOW (ref 42–135)

## 2011-06-04 LAB — FOLATE: Folate: 9.1 ng/mL

## 2011-06-04 LAB — DIFFERENTIAL
Basophils Absolute: 0 10*3/uL (ref 0.0–0.1)
Eosinophils Absolute: 0 10*3/uL (ref 0.0–0.7)
Eosinophils Relative: 0 % (ref 0–5)
Monocytes Absolute: 0.1 10*3/uL (ref 0.1–1.0)

## 2011-06-04 LAB — TISSUE CULTURE: Gram Stain: NONE SEEN

## 2011-06-04 LAB — HEMOGLOBIN A1C
Hgb A1c MFr Bld: 5.5 % (ref 4.6–6.1)
Mean Plasma Glucose: 111 mg/dL

## 2011-06-04 LAB — PH, BODY FLUID

## 2011-06-04 LAB — PLATELET COUNT: Platelets: 265 10*3/uL (ref 150–400)

## 2011-08-06 ENCOUNTER — Encounter: Payer: Self-pay | Admitting: Internal Medicine

## 2011-08-09 ENCOUNTER — Encounter: Payer: Self-pay | Admitting: Internal Medicine

## 2011-08-09 ENCOUNTER — Ambulatory Visit (INDEPENDENT_AMBULATORY_CARE_PROVIDER_SITE_OTHER): Payer: BC Managed Care – PPO | Admitting: Internal Medicine

## 2011-08-09 VITALS — BP 118/72 | HR 62 | Temp 97.9°F | Ht 71.0 in | Wt 251.2 lb

## 2011-08-09 DIAGNOSIS — Z836 Family history of other diseases of the respiratory system: Secondary | ICD-10-CM

## 2011-08-09 DIAGNOSIS — R059 Cough, unspecified: Secondary | ICD-10-CM

## 2011-08-09 DIAGNOSIS — R05 Cough: Secondary | ICD-10-CM

## 2011-08-09 DIAGNOSIS — J869 Pyothorax without fistula: Secondary | ICD-10-CM

## 2011-08-09 NOTE — Progress Notes (Signed)
Subjective:    Patient ID: Jeffrey [name], male    DOB: 05/25/1946, 65 y.o.   MRN: [id number]  HPI   OV 08/09/2011 65 year old male known to me in mid-dec 2009 when he was admitted for Pneumococcal CAP complicated by empyema.  He failed thoracentesis and therapeutic tube throcostomy. Ultimately underwent VATS on 08/16/2008. Last seen in office 10/01/08 and doing well and dc from fu. But now returns 08/09/11 (nearly 3 years later) reporting cough since pneumonia. Says wife insisting he see pulmonary doctor mainly becuase brother died from pulmonary fibrosis 5 years ago and mom also had a bit of pulmonary fibrosis. Dad died from emphysema. States that though cxr looked clear he says family need better satisfaction he does not have pulmonary fibrosis. Cough occurs daily -once every 1-2 hours. Severity is mild.  Wet cough in quality with mild mucus that is clear. No clear cut aggravating or relieving factors. Insidious since pneumonia and stable in course. No nocturnal awakenings. No associated dyspnea, wheezing (but states wife thinks so), hemoptysis, weight loss. Denies associated GERD but is on fish oil, ace inhibitor but reports periodic choking episodes (GI scope pending with Dr Outlaw). Denies known asthma. Per hx: allergy test negative 2011 at [facility].  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 DEnies asbestos, birds, parrots, feathery pillows, mold exposure.   Past, Family, Social: no changes in past 3 years      Review of Systems  Constitutional: Negative for fever and unexpected weight change.  HENT: Positive for trouble swallowing. Negative for ear pain, nosebleeds, congestion, sore throat, rhinorrhea, sneezing, dental problem, postnasal drip and sinus pressure.   Eyes: Negative for redness and itching.  Respiratory: Positive for cough. Negative for chest tightness, shortness of breath and wheezing.     Cardiovascular: Negative for palpitations and leg swelling.  Gastrointestinal: Negative for nausea and vomiting.  Genitourinary: Negative for dysuria.  Musculoskeletal: Negative for joint swelling.  Skin: Negative for rash.  Neurological: Negative for headaches.  Hematological: Does not bruise/bleed easily.  Psychiatric/Behavioral: Negative for dysphoric mood. The patient is not nervous/anxious.        Objective:   Physical Exam  Nursing note and vitals reviewed. Constitutional: He is oriented to person, place, and time. He appears well-developed and well-nourished. No distress.       overweight  HENT:  Head: Normocephalic and atraumatic.  Right Ear: External ear normal.  Left Ear: External ear normal.  Mouth/Throat: Oropharynx is clear and moist. No oropharyngeal exudate.  Eyes: Conjunctivae and EOM are normal. Pupils are equal, round, and reactive to light. Right eye exhibits no discharge. Left eye exhibits no discharge. No scleral icterus.  Neck: Normal range of motion. Neck supple. No JVD present. No tracheal deviation present. No thyromegaly present.  Cardiovascular: Normal rate, regular rhythm and intact distal pulses.  Exam reveals no gallop and no friction rub.   No murmur heard. Pulmonary/Chest: Effort normal and breath sounds normal. No respiratory distress. He has no wheezes. He has no rales. He exhibits no tenderness.  Abdominal: Soft. Bowel sounds are normal. He exhibits no distension and no mass. There is no tenderness. There is no rebound and no guarding.  Musculoskeletal: Normal range of motion. He exhibits no edema and no tenderness.  Lymphadenopathy:    He has no cervical adenopathy.  Neurological: He is alert and oriented to person, place, and time. He has normal   reflexes. No cranial nerve deficit. Coordination normal.  Skin: Skin is warm and dry. No rash noted. He is not diaphoretic. No erythema. No pallor.  Psychiatric: He has a normal mood and affect. His  behavior is normal. Judgment and thought content normal.          Assessment & Plan:   

## 2011-08-09 NOTE — Patient Instructions (Signed)
Please have HRCT chest without contrast - rule out pulmonary fibrosis Will call you with result Please stop fish oil Please finish GI workup with Dr Dulce Sellar REturn to see me in 6 weeks

## 2011-08-10 ENCOUNTER — Encounter: Payer: Self-pay | Admitting: Internal Medicine

## 2011-08-10 DIAGNOSIS — R05 Cough: Secondary | ICD-10-CM | POA: Insufficient documentation

## 2011-08-10 NOTE — Assessment & Plan Note (Signed)
This cough has been present for 3 year since pneumonia and VATS. Common things being common; suspect GERD, LPR. Hje is having some swallowing issues so will have him stop fish oil to reduce impact of gerd and will see what his gi workup with Dr Dulce Sellar reveals. In interim, given hx of pna and concern for fibrosis, will get HRCT chest without contrast and reassess. FU depending on CT chest result  He is agreeable with plan

## 2011-08-26 ENCOUNTER — Encounter (HOSPITAL_COMMUNITY): Payer: Self-pay

## 2011-09-01 ENCOUNTER — Ambulatory Visit (HOSPITAL_COMMUNITY)
Admission: RE | Admit: 2011-09-01 | Discharge: 2011-09-01 | Disposition: A | Discharge status: Home / Self Care | Payer: BC Managed Care – PPO | Source: Ambulatory Visit | Attending: Gastroenterology | Admitting: Gastroenterology

## 2011-09-01 ENCOUNTER — Other Ambulatory Visit: Payer: Self-pay | Admitting: Gastroenterology

## 2011-09-01 ENCOUNTER — Encounter (HOSPITAL_COMMUNITY): Payer: Self-pay | Admitting: *Deleted

## 2011-09-01 ENCOUNTER — Encounter (HOSPITAL_COMMUNITY): Payer: Self-pay | Admitting: Anesthesiology

## 2011-09-01 ENCOUNTER — Encounter (HOSPITAL_COMMUNITY)
Admission: RE | Disposition: A | Discharge status: Home / Self Care | Payer: Self-pay | Source: Ambulatory Visit | Attending: Gastroenterology

## 2011-09-01 ENCOUNTER — Ambulatory Visit (HOSPITAL_COMMUNITY): Payer: BC Managed Care – PPO | Admitting: Anesthesiology

## 2011-09-01 DIAGNOSIS — R059 Cough, unspecified: Secondary | ICD-10-CM | POA: Insufficient documentation

## 2011-09-01 DIAGNOSIS — R1310 Dysphagia, unspecified: Secondary | ICD-10-CM | POA: Insufficient documentation

## 2011-09-01 DIAGNOSIS — R05 Cough: Secondary | ICD-10-CM | POA: Insufficient documentation

## 2011-09-01 HISTORY — DX: Pneumonia, unspecified organism: J18.9

## 2011-09-01 HISTORY — PX: ESOPHAGOGASTRODUODENOSCOPY: SHX5428

## 2011-09-01 HISTORY — PX: BALLOON DILATION: SHX5330

## 2011-09-01 SURGERY — EGD (ESOPHAGOGASTRODUODENOSCOPY)
Anesthesia: Monitor Anesthesia Care

## 2011-09-01 SURGERY — EGD (ESOPHAGOGASTRODUODENOSCOPY)
Anesthesia: Moderate Sedation

## 2011-09-01 MED ORDER — MIDAZOLAM HCL 5 MG/5ML IJ SOLN
INTRAMUSCULAR | Status: DC | PRN
  Administered 2011-09-01: 2 mg via INTRAVENOUS

## 2011-09-01 MED ORDER — FENTANYL CITRATE 0.05 MG/ML IJ SOLN
INTRAMUSCULAR | Status: DC | PRN
  Administered 2011-09-01: 100 ug via INTRAVENOUS

## 2011-09-01 MED ORDER — BUTAMBEN-TETRACAINE-BENZOCAINE 2-2-14 % EX AERO
INHALATION_SPRAY | CUTANEOUS | Status: DC | PRN
  Administered 2011-09-01: 2 via TOPICAL

## 2011-09-01 MED ORDER — SODIUM CHLORIDE 0.9 % IV SOLN
Freq: Once | INTRAVENOUS | Status: AC
  Administered 2011-09-01: 1000 mL via INTRAVENOUS

## 2011-09-01 MED ORDER — KETAMINE HCL 10 MG/ML IJ SOLN
INTRAMUSCULAR | Status: DC | PRN
  Administered 2011-09-01 (×2): 10 mg via INTRAVENOUS

## 2011-09-01 MED ORDER — PROPOFOL 10 MG/ML IV EMUL
INTRAVENOUS | Status: DC | PRN
  Administered 2011-09-01: 100 ug/kg/min via INTRAVENOUS

## 2011-09-01 NOTE — Op Note (Signed)
[facility] [address] [place], [place]  [address]  ENDOSCOPY PROCEDURE REPORT  PATIENT:  Jeffrey [name], Jeffrey [name]  MR#:  [id number] BIRTHDATE:  1946/03/04, 65 yrs. old  GENDER:  male  ENDOSCOPIST:  Willis Modena, MD Referred by:  Kari Baars, M.D.  PROCEDURE DATE:  09/01/2011 PROCEDURE:  EGD with biopsy, 43239 ASA CLASS:  Class II INDICATIONS:  dysphagia, cough  MEDICATIONS:  MAC sedation, administered by CRNA, Cetacaine spray x 2  DESCRIPTION OF PROCEDURE:   After the risks benefits and alternatives of the procedure were thoroughly explained, informed consent was obtained.  The Pentax Gastroscope [id number] endoscope was introduced through the mouth and advanced to the second portion of the duodenum, without limitations.  The instrument was slowly withdrawn as the mucosa was fully examined.  <<PROCEDUREIMAGES>>  FINDINGS:  Irregular GE junction versus short-segment Barrett's esophagus in distal esophagus, biopsies obtained.  No esophageal stricture, ring, or mucosal features of eosinophilic esophagitis identified.  Otherwise normal endoscopy to the second portion of the duodenum.  ENDOSCOPIC IMPRESSION:    1.  Possible Barrett's mucosa, biopsied. 2.  No endoscopic source of dysphagia; I suspect some of his dysphagia is (silent) GERD-relted.  RECOMMENDATIONS:      1.  Watch for potential complications of procedure. 2.  Start Prilosec 20 mg po once-a-day. 3.  Await biopsy  results. 4.  Office visit in 3-4 weeks to reassess symptoms.  REPEAT EXAM:  No  ______________________________ Willis Modena  CC:  n. eSIGNEDWillis Modena at 09/01/2011 10:06 AM  Jerilynn Mages, [phone]

## 2011-09-01 NOTE — Transfer of Care (Signed)
Immediate Anesthesia Transfer of Care Note  Patient: Jeffrey [name]  Procedure(s) Performed:  ESOPHAGOGASTRODUODENOSCOPY (EGD); BALLOON DILATION  Patient Location: Endoscopy Unit  Anesthesia Type: MAC  Level of Consciousness: awake, sedated and patient cooperative  Airway & Oxygen Therapy: Patient Spontanous Breathing and Patient connected to nasal cannula oxygen  Post-op Assessment: Report given to PACU RN and Post -op Vital signs reviewed and stable  Post vital signs: Reviewed and stable  Complications: No apparent anesthesia complications

## 2011-09-01 NOTE — H&P (View-Only) (Signed)
Subjective:    Patient ID: Jeffrey [name], male    DOB: 09-Aug-1946, 66 y.o.   MRN: [id number]  HPI   OV 08/09/2011 66 year old male known to me in mid-dec 2009 when he was admitted for Pneumococcal CAP complicated by empyema.  He failed thoracentesis and therapeutic tube throcostomy. Ultimately underwent VATS on 08/16/2008. Last seen in office 10/01/08 and doing well and dc from fu. But now returns 08/09/11 (nearly 3 years later) reporting cough since pneumonia. Says wife insisting he see pulmonary doctor mainly becuase brother died from pulmonary fibrosis 5 years ago and mom also had a bit of pulmonary fibrosis. Dad died from emphysema. States that though cxr looked clear he says family need better satisfaction he does not have pulmonary fibrosis. Cough occurs daily -once every 1-2 hours. Severity is mild.  Wet cough in quality with mild mucus that is clear. No clear cut aggravating or relieving factors. Insidious since pneumonia and stable in course. No nocturnal awakenings. No associated dyspnea, wheezing (but states wife thinks so), hemoptysis, weight loss. Denies associated GERD but is on fish oil, ace inhibitor but reports periodic choking episodes (GI scope pending with Dr Dulce Sellar). Denies known asthma. Per hx: allergy test negative 2011 at [facility].  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 DEnies asbestos, birds, parrots, feathery pillows, mold exposure.   Past, Family, Social: no changes in past 3 years      Review of Systems  Constitutional: Negative for fever and unexpected weight change.  HENT: Positive for trouble swallowing. Negative for ear pain, nosebleeds, congestion, sore throat, rhinorrhea, sneezing, dental problem, postnasal drip and sinus pressure.   Eyes: Negative for redness and itching.  Respiratory: Positive for cough. Negative for chest tightness, shortness of breath and wheezing.     Cardiovascular: Negative for palpitations and leg swelling.  Gastrointestinal: Negative for nausea and vomiting.  Genitourinary: Negative for dysuria.  Musculoskeletal: Negative for joint swelling.  Skin: Negative for rash.  Neurological: Negative for headaches.  Hematological: Does not bruise/bleed easily.  Psychiatric/Behavioral: Negative for dysphoric mood. The patient is not nervous/anxious.        Objective:   Physical Exam  Nursing note and vitals reviewed. Constitutional: He is oriented to person, place, and time. He appears well-developed and well-nourished. No distress.       overweight  HENT:  Head: Normocephalic and atraumatic.  Right Ear: External ear normal.  Left Ear: External ear normal.  Mouth/Throat: Oropharynx is clear and moist. No oropharyngeal exudate.  Eyes: Conjunctivae and EOM are normal. Pupils are equal, round, and reactive to light. Right eye exhibits no discharge. Left eye exhibits no discharge. No scleral icterus.  Neck: Normal range of motion. Neck supple. No JVD present. No tracheal deviation present. No thyromegaly present.  Cardiovascular: Normal rate, regular rhythm and intact distal pulses.  Exam reveals no gallop and no friction rub.   No murmur heard. Pulmonary/Chest: Effort normal and breath sounds normal. No respiratory distress. He has no wheezes. He has no rales. He exhibits no tenderness.  Abdominal: Soft. Bowel sounds are normal. He exhibits no distension and no mass. There is no tenderness. There is no rebound and no guarding.  Musculoskeletal: Normal range of motion. He exhibits no edema and no tenderness.  Lymphadenopathy:    He has no cervical adenopathy.  Neurological: He is alert and oriented to person, place, and time. He has normal  reflexes. No cranial nerve deficit. Coordination normal.  Skin: Skin is warm and dry. No rash noted. He is not diaphoretic. No erythema. No pallor.  Psychiatric: He has a normal mood and affect. His  behavior is normal. Judgment and thought content normal.          Assessment & Plan:

## 2011-09-01 NOTE — H&P (Signed)
Patient interval history reviewed.  Patient examined again.  There has been no change from documented H/P dated 07/30/2011 (scanned into chart from our office) except as documented above.

## 2011-09-01 NOTE — Interval H&P Note (Signed)
History and Physical Interval Note:  09/01/2011 9:07 AM  Jeffrey [name]  has presented today for surgery, with the diagnosis of dysphagia  The various methods of treatment have been discussed with the patient and family. After consideration of risks, benefits and other options for treatment, the patient has consented to  Procedure(s): ESOPHAGOGASTRODUODENOSCOPY (EGD) BALLOON DILATION as a surgical intervention .  The patients' history has been reviewed, patient examined, no change in status, stable for surgery.  I have reviewed the patients' chart and labs.  Questions were answered to the patient's satisfaction.     Jeffrey [name]  Risks (bleeding, infection, bowel perforation that could require surgery, sedation-related changes in cardiopulmonary systems), benefits (identification and possible treatment of source of symptoms, exclusion of certain causes of symptoms), and alternatives (watchful waiting, radiographic imaging studies, empiric medical treatment) of upper endoscopy (EGD) were explained to patient/family in detail and patient wishes to proceed.

## 2011-09-01 NOTE — Anesthesia Preprocedure Evaluation (Signed)
Anesthesia Evaluation  Patient identified by MRN, date of birth, ID band Patient awake    Reviewed: Allergy & Precautions, H&P , NPO status , Patient's Chart, lab work & pertinent test results, reviewed documented beta blocker date and time   Airway Mallampati: II TM Distance: >3 FB Neck ROM: Full    Dental  (+) Teeth Intact   Pulmonary neg pulmonary ROS,  clear to auscultation        Cardiovascular neg cardio ROS Regular Normal Denies cardiac symptoms   Neuro/Psych Negative Neurological ROS  Negative Psych ROS   GI/Hepatic Neg liver ROS, dysphagia   Endo/Other  obesity  Renal/GU negative Renal ROS  Genitourinary negative   Musculoskeletal negative musculoskeletal ROS (+)   Abdominal   Peds negative pediatric ROS (+)  Hematology negative hematology ROS (+)   Anesthesia Other Findings   Reproductive/Obstetrics negative OB ROS                           Anesthesia Physical Anesthesia Plan  ASA: II  Anesthesia Plan: MAC   Post-op Pain Management:    Induction: Intravenous  Airway Management Planned: Mask  Additional Equipment:   Intra-op Plan:   Post-operative Plan:   Informed Consent: I have reviewed the patients History and Physical, chart, labs and discussed the procedure including the risks, benefits and alternatives for the proposed anesthesia with the patient or authorized representative who has indicated his/her understanding and acceptance.     Plan Discussed with: CRNA and Surgeon  Anesthesia Plan Comments:         Anesthesia Quick Evaluation

## 2011-09-01 NOTE — Anesthesia Postprocedure Evaluation (Signed)
  Anesthesia Post-op Note  Patient: Jeffrey [name]  Procedure(s) Performed:  ESOPHAGOGASTRODUODENOSCOPY (EGD); BALLOON DILATION  Patient Location: PACU  Anesthesia Type: MAC  Level of Consciousness: oriented and sedated  Airway and Oxygen Therapy: Patient Spontanous Breathing  Post-op Pain: none  Post-op Assessment: Post-op Vital signs reviewed, Patient's Cardiovascular Status Stable, Respiratory Function Stable and Patent Airway  Post-op Vital Signs: stable  Complications: No apparent anesthesia complications

## 2011-09-01 NOTE — Brief Op Note (Signed)
Please see [facility] note dated 09/01/10.

## 2011-09-02 ENCOUNTER — Ambulatory Visit (INDEPENDENT_AMBULATORY_CARE_PROVIDER_SITE_OTHER)
Admission: RE | Admit: 2011-09-02 | Discharge: 2011-09-02 | Disposition: A | Discharge status: Home / Self Care | Payer: BC Managed Care – PPO | Source: Ambulatory Visit | Attending: Internal Medicine | Admitting: Internal Medicine

## 2011-09-02 DIAGNOSIS — J869 Pyothorax without fistula: Secondary | ICD-10-CM

## 2011-09-02 DIAGNOSIS — Z836 Family history of other diseases of the respiratory system: Secondary | ICD-10-CM

## 2011-09-06 ENCOUNTER — Encounter (HOSPITAL_COMMUNITY): Payer: Self-pay | Admitting: Gastroenterology

## 2011-09-27 ENCOUNTER — Encounter: Payer: Self-pay | Admitting: Internal Medicine

## 2011-09-27 ENCOUNTER — Ambulatory Visit (INDEPENDENT_AMBULATORY_CARE_PROVIDER_SITE_OTHER): Payer: BC Managed Care – PPO | Admitting: Internal Medicine

## 2011-09-27 VITALS — BP 110/80 | HR 58 | Temp 97.6°F | Ht 71.0 in | Wt 248.0 lb

## 2011-09-27 DIAGNOSIS — R05 Cough: Secondary | ICD-10-CM

## 2011-09-27 DIAGNOSIS — J984 Other disorders of lung: Secondary | ICD-10-CM

## 2011-09-27 DIAGNOSIS — R911 Solitary pulmonary nodule: Secondary | ICD-10-CM | POA: Insufficient documentation

## 2011-09-27 MED ORDER — FLUTICASONE PROPIONATE 50 MCG/ACT NA SUSP: 2.0000 | Freq: Every day | NASAL | Status: DC

## 2011-09-27 NOTE — Assessment & Plan Note (Signed)
This is mild. He  Has agreed to Rx on account of his wife concerns. Likely cause is sinus drainage. IN preference with his wish for least invasive Rx have advised netti pot and nasal steroid. He was not interested in OTC anti-histamine

## 2011-09-27 NOTE — Progress Notes (Signed)
Subjective:    Patient ID: Jeffrey [name], male    DOB: 12-01-1945, 66 y.o.   MRN: [id number]  HPI OV 08/09/2011 66 year old male known to me in mid-dec 2009 when he was admitted for Pneumococcal CAP complicated by empyema.  He failed thoracentesis and therapeutic tube throcostomy. Ultimately underwent VATS on 08/16/2008. Last seen in office 10/01/08 and doing well and dc from fu. But now returns 08/09/11 (nearly 3 years later) reporting cough since pneumonia. Says wife insisting he see pulmonary doctor mainly becuase brother died from pulmonary fibrosis 5 years ago and mom also had a bit of pulmonary fibrosis. Dad died from emphysema. States that though cxr looked clear he says family need better satisfaction he does not have pulmonary fibrosis. Cough occurs daily -once every 1-2 hours. Severity is mild.  Wet cough in quality with mild mucus that is clear. No clear cut aggravating or relieving factors. Insidious since pneumonia and stable in course. No nocturnal awakenings. No associated dyspnea, wheezing (but states wife thinks so), hemoptysis, weight loss. Denies associated GERD but is on fish oil, ace inhibitor but reports periodic choking episodes (GI scope pending with Dr Dulce Sellar). Denies known asthma. Per hx: allergy test negative 2011 at [facility].  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 DEnies asbestos, birds, parrots, feathery pillows, mold exposure.   Past, Family, Social: no changes in past 3 years  REC Please have HRCT chest without contrast - rule out pulmonary fibrosis  Will call you with result  Please stop fish oil  Please finish GI workup with Dr Dulce Sellar  REturn to see me in 6 weeks  OV 09/27/2011 Folloowup cough with concern for ILD. CT chest 09/02/11 showed . No evidence of interstitial lung disease.   Pleural parenchymal scarring at the base of the right hemithorax. Scattered tiny nodular densities  bilaterally. He has stopped fish oil but cough persists unchnaged. RSI cough socre is 11 - Level 1 clearing of throat, coughing aftery lying down. Level 2 - difficulty swallowing, annoying cough and sensation of something in throat. And level 3 - post nasal drip +. COugh is dry. Mild-moderate. He does not want Rx for this cough but feels wife is pushing him into this. Prefers least invasive Rx. Of note, in terms of dysphagia - has seen Dr Dulce Sellar and reportedly GI scope was normal and has been placed on empiric prilosec a few days ago. HE is not interested in an anti-GERD diet.  Past, Family, Social reviewed: no change since last visit    Review of Systems  Constitutional: Negative for fever and unexpected weight change.  HENT: Negative for ear pain, nosebleeds, congestion, sore throat, rhinorrhea, sneezing, trouble swallowing, dental problem, postnasal drip and sinus pressure.   Eyes: Negative for redness and itching.  Respiratory: Positive for cough. Negative for chest tightness, shortness of breath and wheezing.   Cardiovascular: Negative for palpitations and leg swelling.  Gastrointestinal: Negative for nausea and vomiting.  Genitourinary: Negative for dysuria.  Musculoskeletal: Negative for joint swelling.  Skin: Negative for rash.  Neurological: Negative for headaches.  Hematological: Does not bruise/bleed easily.  Psychiatric/Behavioral: Negative for dysphoric mood. The patient is not nervous/anxious.        Objective:   Physical Exam Nursing note and vitals reviewed. Constitutional: He is oriented to person, place, and time. He appears well-developed and well-nourished. No distress.       overweight  HENT:  Head: Normocephalic and atraumatic.  Right Ear: External ear normal.  Left Ear: External ear normal.  Mouth/Throat: Oropharynx is clear and moist. No oropharyngeal exudate.  Eyes: Conjunctivae and EOM are normal. Pupils are equal, round, and reactive to light. Right eye  exhibits no discharge. Left eye exhibits no discharge. No scleral icterus.  Neck: Normal range of motion. Neck supple. No JVD present. No tracheal deviation present. No thyromegaly present.  Cardiovascular: Normal rate, regular rhythm and intact distal pulses.  Exam reveals no gallop and no friction rub.   No murmur heard. Pulmonary/Chest: Effort normal and breath sounds normal. No respiratory distress. He has no wheezes. He has no rales. He exhibits no tenderness.  Abdominal: Soft. Bowel sounds are normal. He exhibits no distension and no mass. There is no tenderness. There is no rebound and no guarding.  Musculoskeletal: Normal range of motion. He exhibits no edema and no tenderness.  Lymphadenopathy:    He has no cervical adenopathy.  Neurological: He is alert and oriented to person, place, and time. He has normal reflexes. No cranial nerve deficit. Coordination normal.  Skin: Skin is warm and dry. No rash noted. He is not diaphoretic. No erythema. No pallor.  Psychiatric: He has a normal mood and affect. His behavior is normal. Judgment and thought content normal.            Assessment & Plan:

## 2011-09-27 NOTE — Assessment & Plan Note (Signed)
Some nodules < 4mm in Jan 2013. Plan will repeat CT chest in 1 year JAn 2014

## 2011-09-27 NOTE — Patient Instructions (Signed)
#  COUGH Let us focus on sinus for cough Please start nettipot with bottled or distilled warm water daily Please start nasal steroid fluticasone generic 2 squirts into each nostril daily Please continue prilosec per Dr Dulce Sellar Return in 6 weeks You do not have fibrosis of your lungs #Lung nodule CT chest wihtout contrast in 1 year #Followup  6 weeks

## 2011-11-08 ENCOUNTER — Ambulatory Visit: Payer: BC Managed Care – PPO | Admitting: Internal Medicine

## 2011-11-15 ENCOUNTER — Encounter: Payer: Self-pay | Admitting: Internal Medicine

## 2011-11-15 ENCOUNTER — Ambulatory Visit (INDEPENDENT_AMBULATORY_CARE_PROVIDER_SITE_OTHER): Payer: BC Managed Care – PPO | Admitting: Internal Medicine

## 2011-11-15 VITALS — BP 122/78 | HR 73 | Temp 98.0°F | Ht 71.0 in | Wt 248.4 lb

## 2011-11-15 DIAGNOSIS — R05 Cough: Secondary | ICD-10-CM

## 2011-11-15 DIAGNOSIS — R911 Solitary pulmonary nodule: Secondary | ICD-10-CM

## 2011-11-15 NOTE — Assessment & Plan Note (Signed)
Repeat ct chest 1  Year. He will call for results

## 2011-11-15 NOTE — Patient Instructions (Signed)
#  Cough  - glad is gone  -  continue generic fluticasone inhaler 2 squirts each nostril daily through spring and then restart in fall/winter or when cough recurs when you stop  - ensure refills on nasal steroid #Lung nodule  - nurse will ensure there is order for repeat ct chest in 1 year  #followup  - no need if ct is fine; just call for results of ct   - come sooner if needed

## 2011-11-15 NOTE — Assessment & Plan Note (Signed)
Resolved after starting nasal steroid. He thinks he might have spring allergies. Advised to continue nasal steroid through spring and stop over summre and trial without nasal steroids and restart again in winter or anytime cough recurs. No followup needed

## 2011-11-15 NOTE — Progress Notes (Signed)
Subjective:    Patient ID: Jeffrey [name], male    DOB: Oct 02, 1945, 66 y.o.   MRN: [id number]  HPI OV 08/09/2011 66 year old male known to me in mid-dec 2009 when he was admitted for Pneumococcal CAP complicated by empyema.  He failed thoracentesis and therapeutic tube throcostomy. Ultimately underwent VATS on 08/16/2008. Last seen in office 10/01/08 and doing well and dc from [facility]. But now returns 08/09/11 (nearly 3 years later) reporting cough since pneumonia. Says wife insisting he see pulmonary doctor mainly becuase brother died from pulmonary fibrosis 5 years ago and mom also had a bit of pulmonary fibrosis. Dad died from emphysema. States that though cxr looked clear he says family need better satisfaction he does not have pulmonary fibrosis. Cough occurs daily -once every 1-2 hours. Severity is mild.  Wet cough in quality with mild mucus that is clear. No clear cut aggravating or relieving factors. Insidious since pneumonia and stable in course. No nocturnal awakenings. No associated dyspnea, wheezing (but states wife thinks so), hemoptysis, weight loss. Denies associated GERD but is on fish oil, ace inhibitor but reports periodic choking episodes (GI scope pending with Dr Dulce Sellar). Denies known asthma. Per hx: allergy test negative 2011 at [facility].  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 DEnies asbestos, birds, parrots, feathery pillows, mold exposure.   Past, Family, Social: no changes in past 3 years  REC Please have HRCT chest without contrast - rule out pulmonary fibrosis  Will call you with result  Please stop fish oil  Please finish GI workup with Dr Dulce Sellar  REturn to see me in 6 weeks  OV 09/27/2011 Folloowup cough with concern for ILD. CT chest 09/02/11 showed . No evidence of interstitial lung disease.   Pleural parenchymal scarring at the base of the right hemithorax. Scattered tiny nodular densities  bilaterally. He has stopped fish oil but cough persists unchnaged. RSI cough socre is 11 - Level 1 clearing of throat, coughing aftery lying down. Level 2 - difficulty swallowing, annoying cough and sensation of something in throat. And level 3 - post nasal drip +. COugh is dry. Mild-moderate. He does not want Rx for this cough but feels wife is pushing him into this. Prefers least invasive Rx. Of note, in terms of dysphagia - has seen Dr Dulce Sellar and reportedly GI scope was normal and has been placed on empiric prilosec a few days ago. HE is not interested in an anti-GERD diet.  Past, Family, Social reviewed: no change since last visit   #COUGH  Let us focus on sinus for cough  Please start nettipot with bottled or distilled warm water daily  Please start nasal steroid fluticasone generic 2 squirts into each nostril daily  Please continue prilosec per Dr Dulce Sellar  Return in 6 weeks  You do not have fibrosis of your lungs  #Lung nodule  CT chest wihtout contrast in 1 year  #Followup  6 weeks   OV 11/15/2011 Followup cough: Cough resolved with nasal steroids. Asymptomatic. RSI cough score is 0. Feels fine. Says wife happy. Compliant with inhaler.   Past, Family, Social reviewed: no change since last visit   Review of Systems  Constitutional: Negative for fever and unexpected weight change.  HENT: Negative for ear pain, nosebleeds, congestion, sore throat, rhinorrhea, sneezing, trouble swallowing, dental problem, postnasal drip and sinus pressure.   Eyes: Negative for redness and itching.  Respiratory: Negative for cough, chest tightness, shortness of breath and wheezing.   Cardiovascular: Negative for palpitations and leg swelling.  Gastrointestinal: Negative for nausea and vomiting.  Genitourinary: Negative for dysuria.  Musculoskeletal: Negative for joint swelling.  Skin: Negative for rash.  Neurological: Negative for headaches.  Hematological: Does not bruise/bleed easily.    Psychiatric/Behavioral: Negative for dysphoric mood. The patient is not nervous/anxious.        Objective:   Physical Exam Nursing note and vitals reviewed. Constitutional: He is oriented to person, place, and time. He appears well-developed and well-nourished. No distress.       overweight  HENT:  Head: Normocephalic and atraumatic.  Right Ear: External ear normal.  Left Ear: External ear normal.  Mouth/Throat: Oropharynx is clear and moist. No oropharyngeal exudate.  Eyes: Conjunctivae and EOM are normal. Pupils are equal, round, and reactive to light. Right eye exhibits no discharge. Left eye exhibits no discharge. No scleral icterus.  Neck: Normal range of motion. Neck supple. No JVD present. No tracheal deviation present. No thyromegaly present.  Cardiovascular: Normal rate, regular rhythm and intact distal pulses.  Exam reveals no gallop and no friction rub.   No murmur heard. Pulmonary/Chest: Effort normal and breath sounds normal. No respiratory distress. He has no wheezes. He has no rales. He exhibits no tenderness.  Abdominal: Soft. Bowel sounds are normal. He exhibits no distension and no mass. There is no tenderness. There is no rebound and no guarding.  Musculoskeletal: Normal range of motion. He exhibits no edema and no tenderness.  Lymphadenopathy:    He has no cervical adenopathy.  Neurological: He is alert and oriented to person, place, and time. He has normal reflexes. No cranial nerve deficit. Coordination normal.  Skin: Skin is warm and dry. No rash noted. He is not diaphoretic. No erythema. No pallor.  Psychiatric: He has a normal mood and affect. His behavior is normal. Judgment and thought content normal.           Assessment & Plan:

## 2012-06-20 ENCOUNTER — Telehealth: Payer: Self-pay | Admitting: Internal Medicine

## 2012-06-20 MED ORDER — FLUTICASONE PROPIONATE 50 MCG/ACT NA SUSP: 2.0000 | Freq: Every day | NASAL | Status: DC

## 2012-06-20 NOTE — Telephone Encounter (Signed)
LM that Fluricasone refill was called to [facility] [address]

## 2012-09-06 ENCOUNTER — Inpatient Hospital Stay: Admission: RE | Admit: 2012-09-06 | Payer: BC Managed Care – PPO | Source: Ambulatory Visit

## 2012-09-07 ENCOUNTER — Ambulatory Visit (INDEPENDENT_AMBULATORY_CARE_PROVIDER_SITE_OTHER)
Admission: RE | Admit: 2012-09-07 | Discharge: 2012-09-07 | Disposition: A | Discharge status: Home / Self Care | Payer: BC Managed Care – PPO | Source: Ambulatory Visit | Attending: Internal Medicine | Admitting: Internal Medicine

## 2012-09-07 DIAGNOSIS — R911 Solitary pulmonary nodule: Secondary | ICD-10-CM

## 2012-09-13 ENCOUNTER — Telehealth: Payer: Self-pay | Admitting: Internal Medicine

## 2012-09-13 NOTE — Telephone Encounter (Signed)
CT chest Jan 2014   -no change from Jan 2013. Nodules are very small and stable  - no furhter followup needed  - Please inform him  - No office followup needed unless he desire sos   Dr. Kalman Shan, M.D., [facility]C.P Pulmonary and Critical Care Medicine Staff Physician [facility] [place] [facility] Pager: [phone], If no answer or between  15:00h - 7:00h: call [phone]  09/13/2012 1:55 AM       Clinical Data: Follow-up pulmonary nodules, history empyema  CT CHEST WITHOUT CONTRAST  - JAn 2014 Technique: Multidetector CT imaging of the chest was performed  following the standard protocol without IV contrast. Sagittal and  coronal MPR images reconstructed from axial data set.  Comparison: 09/02/2011  Findings:  Mild scattered atherosclerotic calcifications aorta.  Stable low attenuation lesion posterior right lobe liver 13 x 11 mm  image 48.  Visualized portion of the upper abdomen unremarkable.  No thoracic adenopathy.  Prior cervical spine fusion.  No acute osseous findings.  Few tiny scattered nodular foci are seen within both lungs,  nonspecific.  No enlarging pulmonary mass or nodule is identified.  Right basilar scarring is identified, question sequela of empyema.  IMPRESSION:  Stable tiny pulmonary nodules; these appear unchanged since prior  CT of 09/02/2011 with the two largest nodules, in the left upper  lobe, stable in appearance since an earlier study of 08/12/2008.  No new intrathoracic abnormalities.  Right basilar scarring.  Original Report Authenticated By: Ulyses Southward, M.D.             External Result Report     External Result Report

## 2012-09-14 NOTE — Telephone Encounter (Signed)
Pt advised. Samule Life, CMA  

## 2013-02-26 DIAGNOSIS — Z111 Encounter for screening for respiratory tuberculosis: Secondary | ICD-10-CM | POA: Diagnosis not present

## 2013-05-04 DIAGNOSIS — M542 Cervicalgia: Secondary | ICD-10-CM | POA: Diagnosis not present

## 2013-05-04 DIAGNOSIS — Z6836 Body mass index (BMI) 36.0-36.9, adult: Secondary | ICD-10-CM | POA: Diagnosis not present

## 2013-05-04 DIAGNOSIS — K589 Irritable bowel syndrome without diarrhea: Secondary | ICD-10-CM | POA: Diagnosis not present

## 2013-05-04 DIAGNOSIS — M25519 Pain in unspecified shoulder: Secondary | ICD-10-CM | POA: Diagnosis not present

## 2013-05-22 DIAGNOSIS — M25519 Pain in unspecified shoulder: Secondary | ICD-10-CM | POA: Diagnosis not present

## 2013-08-01 DIAGNOSIS — Z125 Encounter for screening for malignant neoplasm of prostate: Secondary | ICD-10-CM | POA: Diagnosis not present

## 2013-08-01 DIAGNOSIS — E669 Obesity, unspecified: Secondary | ICD-10-CM | POA: Diagnosis not present

## 2013-08-01 DIAGNOSIS — J984 Other disorders of lung: Secondary | ICD-10-CM | POA: Diagnosis not present

## 2013-08-01 DIAGNOSIS — Z0000 Encounter for general adult medical examination without abnormal findings: Secondary | ICD-10-CM | POA: Diagnosis not present

## 2013-08-09 DIAGNOSIS — M542 Cervicalgia: Secondary | ICD-10-CM | POA: Diagnosis not present

## 2013-08-09 DIAGNOSIS — Z86010 Personal history of colonic polyps: Secondary | ICD-10-CM | POA: Diagnosis not present

## 2013-08-09 DIAGNOSIS — J189 Pneumonia, unspecified organism: Secondary | ICD-10-CM | POA: Diagnosis not present

## 2013-08-09 DIAGNOSIS — E669 Obesity, unspecified: Secondary | ICD-10-CM | POA: Diagnosis not present

## 2013-08-09 DIAGNOSIS — Z6835 Body mass index (BMI) 35.0-35.9, adult: Secondary | ICD-10-CM | POA: Diagnosis not present

## 2013-08-09 DIAGNOSIS — M25519 Pain in unspecified shoulder: Secondary | ICD-10-CM | POA: Diagnosis not present

## 2013-08-09 DIAGNOSIS — Z0000 Encounter for general adult medical examination without abnormal findings: Secondary | ICD-10-CM | POA: Diagnosis not present

## 2013-08-09 DIAGNOSIS — Z1331 Encounter for screening for depression: Secondary | ICD-10-CM | POA: Diagnosis not present

## 2013-08-10 DIAGNOSIS — Z23 Encounter for immunization: Secondary | ICD-10-CM | POA: Diagnosis not present

## 2013-08-15 DIAGNOSIS — Z1212 Encounter for screening for malignant neoplasm of rectum: Secondary | ICD-10-CM | POA: Diagnosis not present

## 2013-09-25 DIAGNOSIS — Z23 Encounter for immunization: Secondary | ICD-10-CM | POA: Diagnosis not present

## 2014-04-01 DIAGNOSIS — Z6836 Body mass index (BMI) 36.0-36.9, adult: Secondary | ICD-10-CM | POA: Diagnosis not present

## 2014-04-01 DIAGNOSIS — L259 Unspecified contact dermatitis, unspecified cause: Secondary | ICD-10-CM | POA: Diagnosis not present

## 2014-04-09 DIAGNOSIS — L259 Unspecified contact dermatitis, unspecified cause: Secondary | ICD-10-CM | POA: Diagnosis not present

## 2014-05-29 DIAGNOSIS — Z23 Encounter for immunization: Secondary | ICD-10-CM | POA: Diagnosis not present

## 2014-08-20 DIAGNOSIS — M542 Cervicalgia: Secondary | ICD-10-CM | POA: Diagnosis not present

## 2014-08-20 DIAGNOSIS — Z125 Encounter for screening for malignant neoplasm of prostate: Secondary | ICD-10-CM | POA: Diagnosis not present

## 2014-08-20 DIAGNOSIS — E669 Obesity, unspecified: Secondary | ICD-10-CM | POA: Diagnosis not present

## 2014-08-20 DIAGNOSIS — Z0000 Encounter for general adult medical examination without abnormal findings: Secondary | ICD-10-CM | POA: Diagnosis not present

## 2014-08-28 DIAGNOSIS — J189 Pneumonia, unspecified organism: Secondary | ICD-10-CM | POA: Diagnosis not present

## 2014-08-28 DIAGNOSIS — J869 Pyothorax without fistula: Secondary | ICD-10-CM | POA: Diagnosis not present

## 2014-08-28 DIAGNOSIS — E669 Obesity, unspecified: Secondary | ICD-10-CM | POA: Diagnosis not present

## 2014-08-28 DIAGNOSIS — Z0000 Encounter for general adult medical examination without abnormal findings: Secondary | ICD-10-CM | POA: Diagnosis not present

## 2014-08-28 DIAGNOSIS — Z125 Encounter for screening for malignant neoplasm of prostate: Secondary | ICD-10-CM | POA: Diagnosis not present

## 2014-08-28 DIAGNOSIS — Z86010 Personal history of colonic polyps: Secondary | ICD-10-CM | POA: Diagnosis not present

## 2014-08-28 DIAGNOSIS — K589 Irritable bowel syndrome without diarrhea: Secondary | ICD-10-CM | POA: Diagnosis not present

## 2014-08-28 DIAGNOSIS — Z1212 Encounter for screening for malignant neoplasm of rectum: Secondary | ICD-10-CM | POA: Diagnosis not present

## 2014-08-28 DIAGNOSIS — G609 Hereditary and idiopathic neuropathy, unspecified: Secondary | ICD-10-CM | POA: Diagnosis not present

## 2014-08-28 DIAGNOSIS — Z1389 Encounter for screening for other disorder: Secondary | ICD-10-CM | POA: Diagnosis not present

## 2014-11-26 DIAGNOSIS — M7750 Other enthesopathy of unspecified foot: Secondary | ICD-10-CM | POA: Diagnosis not present

## 2014-11-26 DIAGNOSIS — G609 Hereditary and idiopathic neuropathy, unspecified: Secondary | ICD-10-CM | POA: Diagnosis not present

## 2014-11-26 DIAGNOSIS — M792 Neuralgia and neuritis, unspecified: Secondary | ICD-10-CM | POA: Diagnosis not present

## 2014-11-26 DIAGNOSIS — M722 Plantar fascial fibromatosis: Secondary | ICD-10-CM | POA: Diagnosis not present

## 2014-12-17 DIAGNOSIS — M722 Plantar fascial fibromatosis: Secondary | ICD-10-CM | POA: Diagnosis not present

## 2014-12-24 DIAGNOSIS — M5416 Radiculopathy, lumbar region: Secondary | ICD-10-CM | POA: Diagnosis not present

## 2014-12-24 DIAGNOSIS — M545 Low back pain: Secondary | ICD-10-CM | POA: Diagnosis not present

## 2014-12-25 DIAGNOSIS — I10 Essential (primary) hypertension: Secondary | ICD-10-CM | POA: Diagnosis not present

## 2014-12-25 DIAGNOSIS — Z01812 Encounter for preprocedural laboratory examination: Secondary | ICD-10-CM | POA: Diagnosis not present

## 2014-12-31 DIAGNOSIS — M5416 Radiculopathy, lumbar region: Secondary | ICD-10-CM | POA: Diagnosis not present

## 2015-01-07 DIAGNOSIS — M545 Low back pain: Secondary | ICD-10-CM | POA: Diagnosis not present

## 2015-01-07 DIAGNOSIS — M5416 Radiculopathy, lumbar region: Secondary | ICD-10-CM | POA: Diagnosis not present

## 2015-01-07 DIAGNOSIS — M5136 Other intervertebral disc degeneration, lumbar region: Secondary | ICD-10-CM | POA: Diagnosis not present

## 2015-01-07 DIAGNOSIS — M4316 Spondylolisthesis, lumbar region: Secondary | ICD-10-CM | POA: Diagnosis not present

## 2015-01-17 DIAGNOSIS — M7751 Other enthesopathy of right foot: Secondary | ICD-10-CM | POA: Diagnosis not present

## 2015-01-28 DIAGNOSIS — Z9889 Other specified postprocedural states: Secondary | ICD-10-CM | POA: Diagnosis not present

## 2015-01-28 DIAGNOSIS — M5441 Lumbago with sciatica, right side: Secondary | ICD-10-CM | POA: Diagnosis not present

## 2015-02-05 DIAGNOSIS — M545 Low back pain: Secondary | ICD-10-CM | POA: Diagnosis not present

## 2015-02-10 DIAGNOSIS — M545 Low back pain: Secondary | ICD-10-CM | POA: Diagnosis not present

## 2015-02-12 DIAGNOSIS — M545 Low back pain: Secondary | ICD-10-CM | POA: Diagnosis not present

## 2015-02-17 DIAGNOSIS — M545 Low back pain: Secondary | ICD-10-CM | POA: Diagnosis not present

## 2015-02-19 DIAGNOSIS — M545 Low back pain: Secondary | ICD-10-CM | POA: Diagnosis not present

## 2015-02-24 DIAGNOSIS — M545 Low back pain: Secondary | ICD-10-CM | POA: Diagnosis not present

## 2015-02-25 DIAGNOSIS — M545 Low back pain: Secondary | ICD-10-CM | POA: Diagnosis not present

## 2015-02-26 DIAGNOSIS — M545 Low back pain: Secondary | ICD-10-CM | POA: Diagnosis not present

## 2015-03-04 DIAGNOSIS — M545 Low back pain: Secondary | ICD-10-CM | POA: Diagnosis not present

## 2015-03-06 DIAGNOSIS — M545 Low back pain: Secondary | ICD-10-CM | POA: Diagnosis not present

## 2015-03-10 DIAGNOSIS — M545 Low back pain: Secondary | ICD-10-CM | POA: Diagnosis not present

## 2015-03-11 DIAGNOSIS — M5136 Other intervertebral disc degeneration, lumbar region: Secondary | ICD-10-CM | POA: Diagnosis not present

## 2015-03-11 DIAGNOSIS — M5441 Lumbago with sciatica, right side: Secondary | ICD-10-CM | POA: Diagnosis not present

## 2015-03-12 DIAGNOSIS — M545 Low back pain: Secondary | ICD-10-CM | POA: Diagnosis not present

## 2015-03-17 DIAGNOSIS — M545 Low back pain: Secondary | ICD-10-CM | POA: Diagnosis not present

## 2015-03-20 DIAGNOSIS — M545 Low back pain: Secondary | ICD-10-CM | POA: Diagnosis not present

## 2015-03-25 DIAGNOSIS — M25512 Pain in left shoulder: Secondary | ICD-10-CM | POA: Diagnosis not present

## 2015-03-25 DIAGNOSIS — M545 Low back pain: Secondary | ICD-10-CM | POA: Diagnosis not present

## 2015-06-28 DIAGNOSIS — Z23 Encounter for immunization: Secondary | ICD-10-CM | POA: Diagnosis not present

## 2015-08-27 DIAGNOSIS — Z125 Encounter for screening for malignant neoplasm of prostate: Secondary | ICD-10-CM | POA: Diagnosis not present

## 2015-08-27 DIAGNOSIS — R0609 Other forms of dyspnea: Secondary | ICD-10-CM | POA: Diagnosis not present

## 2015-08-27 DIAGNOSIS — Z0000 Encounter for general adult medical examination without abnormal findings: Secondary | ICD-10-CM | POA: Diagnosis not present

## 2015-09-03 ENCOUNTER — Telehealth: Payer: Self-pay | Admitting: Cardiology

## 2015-09-03 ENCOUNTER — Other Ambulatory Visit (HOSPITAL_COMMUNITY): Payer: Self-pay | Admitting: Respiratory Therapy

## 2015-09-03 DIAGNOSIS — M5416 Radiculopathy, lumbar region: Secondary | ICD-10-CM | POA: Diagnosis not present

## 2015-09-03 DIAGNOSIS — Z6836 Body mass index (BMI) 36.0-36.9, adult: Secondary | ICD-10-CM | POA: Diagnosis not present

## 2015-09-03 DIAGNOSIS — G609 Hereditary and idiopathic neuropathy, unspecified: Secondary | ICD-10-CM | POA: Diagnosis not present

## 2015-09-03 DIAGNOSIS — R0609 Other forms of dyspnea: Secondary | ICD-10-CM | POA: Diagnosis not present

## 2015-09-03 DIAGNOSIS — Z0000 Encounter for general adult medical examination without abnormal findings: Secondary | ICD-10-CM | POA: Diagnosis not present

## 2015-09-03 DIAGNOSIS — Z1389 Encounter for screening for other disorder: Secondary | ICD-10-CM | POA: Diagnosis not present

## 2015-09-03 DIAGNOSIS — Z86010 Personal history of colonic polyps: Secondary | ICD-10-CM | POA: Diagnosis not present

## 2015-09-03 DIAGNOSIS — E668 Other obesity: Secondary | ICD-10-CM | POA: Diagnosis not present

## 2015-09-03 NOTE — Telephone Encounter (Signed)
Received records from [facility] for appointment with Dr Stanford Breed on 09/29/15.  Records given to [facility] (medical records) for Dr Jacalyn Lefevre schedule on 09/29/15.

## 2015-09-05 ENCOUNTER — Ambulatory Visit (HOSPITAL_COMMUNITY)
Admission: RE | Admit: 2015-09-05 | Discharge: 2015-09-05 | Disposition: A | Discharge status: Home / Self Care | Payer: BC Managed Care – PPO | Source: Ambulatory Visit | Attending: Internal Medicine | Admitting: Internal Medicine

## 2015-09-05 LAB — PULMONARY FUNCTION TEST
DL/VA % PRED: 98 %
DL/VA: 4.6 ml/min/mmHg/L
DLCO UNC % PRED: 75 %
DLCO unc: 25.44 ml/min/mmHg
FEF 25-75 PRE: 2.42 L/s
FEF2575-%Pred-Pre: 90 %
FEV1-%Pred-Pre: 79 %
FEV1-PRE: 2.76 L
FEV1FVC-%Pred-Pre: 104 %
FEV6-%Pred-Pre: 80 %
FEV6-Pre: 3.56 L
FEV6FVC-%PRED-PRE: 105 %
FVC-%PRED-PRE: 75 %
FVC-PRE: 3.56 L
PRE FEV1/FVC RATIO: 78 %
Pre FEV6/FVC Ratio: 100 %
RV % pred: 102 %
RV: 2.52 L
TLC % pred: 86 %
TLC: 6.24 L

## 2015-09-11 DIAGNOSIS — Z1212 Encounter for screening for malignant neoplasm of rectum: Secondary | ICD-10-CM | POA: Diagnosis not present

## 2015-09-29 ENCOUNTER — Ambulatory Visit: Payer: BC Managed Care – PPO | Admitting: Cardiology

## 2015-10-03 DIAGNOSIS — K648 Other hemorrhoids: Secondary | ICD-10-CM | POA: Diagnosis not present

## 2015-10-03 DIAGNOSIS — Z86010 Personal history of colonic polyps: Secondary | ICD-10-CM | POA: Diagnosis not present

## 2015-10-14 DIAGNOSIS — J449 Chronic obstructive pulmonary disease, unspecified: Secondary | ICD-10-CM | POA: Diagnosis not present

## 2015-10-14 DIAGNOSIS — Z6835 Body mass index (BMI) 35.0-35.9, adult: Secondary | ICD-10-CM | POA: Diagnosis not present

## 2015-10-14 DIAGNOSIS — E668 Other obesity: Secondary | ICD-10-CM | POA: Diagnosis not present

## 2015-10-14 DIAGNOSIS — R0609 Other forms of dyspnea: Secondary | ICD-10-CM | POA: Diagnosis not present

## 2015-10-21 DIAGNOSIS — M7542 Impingement syndrome of left shoulder: Secondary | ICD-10-CM | POA: Diagnosis not present

## 2015-10-28 DIAGNOSIS — M25512 Pain in left shoulder: Secondary | ICD-10-CM | POA: Diagnosis not present

## 2015-10-28 DIAGNOSIS — M7542 Impingement syndrome of left shoulder: Secondary | ICD-10-CM | POA: Diagnosis not present

## 2015-11-10 DIAGNOSIS — M25512 Pain in left shoulder: Secondary | ICD-10-CM | POA: Diagnosis not present

## 2015-11-10 DIAGNOSIS — G8929 Other chronic pain: Secondary | ICD-10-CM | POA: Diagnosis not present

## 2015-11-26 ENCOUNTER — Ambulatory Visit: Payer: Self-pay | Admitting: Orthopedic Surgery

## 2015-11-26 NOTE — H&P (Signed)
Jeffrey [name] is an 70 y.o. male.   Chief Complaint: L shoulder pain HPI: The patient is a 70 year old male who presents today for follow up of their shoulder. The patient is being followed for their left shoulder pain. They are 4 week(s) out from flare up. Symptoms reported today include: pain. The patient feels that they are doing poorly and report their pain level to be moderate. Current treatment includes: activity modification. The following medication has been used for pain control: none. The patient presents today following a cortisone injection x 7 days. The patient reports the injection did not help.  Not much pain, relief from the injection. He is tender in the anterior subacromial region. Positive impingement sign. Weak in external rotation.   Past Medical History  Diagnosis Date  . Non-smoker   . EMPYEMA 10/01/2008  . Pneumonia     Past Surgical History  Procedure Laterality Date  . Post neck surgery  1966  . Shoulder surgery  1998  . Thoracic sug. due to pneumonia  dec. 2010  . Lumbar disc surgery-2007    . Post neck surgeryx2    . Esophagogastroduodenoscopy  09/01/2011    Procedure: ESOPHAGOGASTRODUODENOSCOPY (EGD);  Surgeon: Landry Dyke, MD;  Location: [facility];  Service: Endoscopy;  Laterality: N/A;  . Balloon dilation  09/01/2011    Procedure: BALLOON DILATION;  Surgeon: Landry Dyke, MD;  Location: [facility];  Service: Endoscopy;  Laterality: N/A;    Family History  Problem Relation Age of Onset  . Lung disease    . Cancer Mother   . Pulmonary fibrosis Mother   . Emphysema Father   . Pulmonary fibrosis Brother    Social History:  reports that he has never smoked. He has never used smokeless tobacco. He reports that he drinks alcohol. He reports that he does not use illicit drugs.  Allergies: No Known Allergies   (Not in a hospital admission)  No results found for this or any previous visit (from the past 48 hour(s)). No results  found.  Review of Systems  Constitutional: Negative.   HENT: Negative.   Eyes: Negative.   Respiratory: Negative.   Cardiovascular: Negative.   Gastrointestinal: Negative.   Genitourinary: Negative.   Musculoskeletal: Positive for joint pain.  Skin: Negative.   Neurological: Negative.     There were no vitals taken for this visit. Physical Exam  Constitutional: He is oriented to person, place, and time. He appears well-developed.  HENT:  Head: Normocephalic.  Eyes: Pupils are equal, round, and reactive to light.  Neck: Normal range of motion.  Cardiovascular: Normal rate.   Respiratory: Effort normal.  GI: Soft.  Musculoskeletal:  On exam, he is well nourished, well developed, awake, alert, and oriented x3, no acute distress. Examination of the left shoulder, he is nontender over the subacromial space, AC joint, [place] joint, and clavicle. Mild tenderness over the trapezius and the deltoid. Positive impingement and secondary impingement testing. He has full forward flexion, internal rotation and abduction, mildly decreased passive external rotation. No instability or laxity noted. Upper extremity strength 5/5 with abduction, triceps, and internal rotation strength testing bilaterally. On the left, he has some trace weakness with external rotation and biceps strength testing. Sensation intact distally.  Neurological: He is alert and oriented to person, place, and time.    Prior x-rays type 3 acromion  MRI L shoulder with high grade infraspinatus tear; AC joint hypertrophy associated with impingement; labral tear and degenerative changes of  the labrum  Assessment/Plan L RCT  We discussed options. He does not want to wait too long. He will proceed with an MRI to rule out a rotator cuff tear, which I indicated would require repair. He said it will take a while until he can get that because he is going away. I injected him with a cortisone injection at this point. After discussing risks  and benefits with the patient, we proceeded with a shoulder injection. I sterilely prepped the posterior subacromial region with Betadine and alcohol and injected the patient with 1 mL of Aristospan and 8 mL of 1% Lidocaine solution with reduction of pain. Post-injection care was discussed with the patient. I will see him back following that. We discussed the differential diagnosis, corresponding treatment algorithm.  Discussed his MRI results and recommend proceeding with L RCR, mini-open, possible patch graft. Discussed procedure itself, risks, complications and alternatives. Pt desires to proceed.  Plan L shoulder mini-open RCR, possible patch graft  Cecilie Kicks., PA-C for Dr. Tonita Cong 11/26/2015, 3:40 PM

## 2015-11-27 ENCOUNTER — Encounter (HOSPITAL_COMMUNITY): Payer: Self-pay

## 2015-11-27 ENCOUNTER — Encounter (HOSPITAL_COMMUNITY)
Admission: RE | Admit: 2015-11-27 | Discharge: 2015-11-27 | Disposition: A | Discharge status: Home / Self Care | Payer: Medicare Other | Source: Ambulatory Visit | Attending: Specialist | Admitting: Specialist

## 2015-11-27 DIAGNOSIS — Z01812 Encounter for preprocedural laboratory examination: Secondary | ICD-10-CM | POA: Insufficient documentation

## 2015-11-27 DIAGNOSIS — M75102 Unspecified rotator cuff tear or rupture of left shoulder, not specified as traumatic: Secondary | ICD-10-CM | POA: Insufficient documentation

## 2015-11-27 HISTORY — DX: Unspecified rotator cuff tear or rupture of unspecified shoulder, not specified as traumatic: M75.100

## 2015-11-27 HISTORY — DX: Sleep disorder, unspecified: G47.9

## 2015-11-27 HISTORY — DX: Unspecified osteoarthritis, unspecified site: M19.90

## 2015-11-27 LAB — CBC
HEMATOCRIT: 38.7 % — AB (ref 39.0–52.0)
HEMOGLOBIN: 13.2 g/dL (ref 13.0–17.0)
MCH: 31.1 pg (ref 26.0–34.0)
MCHC: 34.1 g/dL (ref 30.0–36.0)
MCV: 91.3 fL (ref 78.0–100.0)
Platelets: 254 10*3/uL (ref 150–400)
RBC: 4.24 MIL/uL (ref 4.22–5.81)
RDW: 13.4 % (ref 11.5–15.5)
WBC: 6.5 10*3/uL (ref 4.0–10.5)

## 2015-11-27 NOTE — Patient Instructions (Addendum)
YOUR PROCEDURE IS SCHEDULED ON :  12/03/15  REPORT TO [facility] [facility] MAIN ENTRANCE FOLLOW SIGNS TO EAST ELEVATOR - GO TO 3rd FLOOR CHECK IN AT 3 EAST NURSES STATION (SHORT STAY) AT:  9:30 AM  CALL THIS NUMBER IF YOU HAVE PROBLEMS THE MORNING OF SURGERY [phone]  REMEMBER:ONLY 1 PER PERSON MAY GO TO SHORT STAY WITH YOU TO GET READY THE MORNING OF YOUR SURGERY  DO NOT EAT FOOD OR DRINK LIQUIDS AFTER MIDNIGHT  TAKE THESE MEDICINES THE MORNING OF SURGERY: NONE  YOU MAY NOT HAVE ANY METAL ON YOUR BODY INCLUDING HAIR PINS AND PIERCING'S. DO NOT WEAR JEWELRY, MAKEUP, LOTIONS, POWDERS OR PERFUMES. DO NOT WEAR NAIL POLISH. DO NOT SHAVE 48 HRS PRIOR TO SURGERY. MEN MAY SHAVE FACE AND NECK.  DO NOT [place]. [place] IS NOT RESPONSIBLE FOR VALUABLES.  CONTACTS, DENTURES OR PARTIALS MAY NOT BE WORN TO SURGERY. LEAVE SUITCASE IN CAR. CAN BE BROUGHT TO ROOM AFTER SURGERY.  PATIENTS DISCHARGED THE DAY OF SURGERY WILL NOT BE ALLOWED TO DRIVE HOME.  PLEASE READ OVER THE FOLLOWING INSTRUCTION SHEETS _________________________________________________________________________________                                          [place] - PREPARING FOR SURGERY  Before surgery, you can play an important role.  Because skin is not sterile, your skin needs to be as free of germs as possible.  You can reduce the number of germs on your skin by washing with CHG (chlorahexidine gluconate) soap before surgery.  CHG is an antiseptic cleaner which kills germs and bonds with the skin to continue killing germs even after washing. Please DO NOT use if you have an allergy to CHG or antibacterial soaps.  If your skin becomes reddened/irritated stop using the CHG and inform your nurse when you arrive at Short Stay. Do not shave (including legs and underarms) for at least 48 hours prior to the first CHG shower.  You may shave your face. Please follow these instructions  carefully:   1.  Shower with CHG Soap the night before surgery and the  morning of Surgery.   2.  If you choose to wash your hair, wash your hair first as usual with your  normal  Shampoo.   3.  After you shampoo, rinse your hair and body thoroughly to remove the  shampoo.                                         4.  Use CHG as you would any other liquid soap.  You can apply chg directly  to the skin and wash . Gently wash with scrungie or clean wascloth    5.  Apply the CHG Soap to your body ONLY FROM THE NECK DOWN.   Do not use on open                           Wound or open sores. Avoid contact with eyes, ears mouth and genitals (private parts).                        Genitals (private parts) with your normal soap.  6.  Wash thoroughly, paying special attention to the area where your surgery  will be performed.   7.  Thoroughly rinse your body with warm water from the neck down.   8.  DO NOT shower/wash with your normal soap after using and rinsing off  the CHG Soap .                9.  Pat yourself dry with a clean towel.             10.  Wear clean night clothes to bed after shower             11.  Place clean sheets on your bed the night of your first shower and do not  sleep with pets.  Day of Surgery : Do not apply any lotions/deodorants the morning of surgery.  Please wear clean clothes to the hospital/surgery center.  FAILURE TO FOLLOW THESE INSTRUCTIONS MAY RESULT IN THE CANCELLATION OF YOUR SURGERY    PATIENT SIGNATURE_________________________________  ______________________________________________________________________     Jeffrey [name]  An incentive spirometer is a tool that can help keep your lungs clear and active. This tool measures how well you are filling your lungs with each breath. Taking long deep breaths may help reverse or decrease the chance of developing breathing (pulmonary) problems (especially infection) following:  A long  period of time when you are unable to move or be active. BEFORE THE PROCEDURE   If the spirometer includes an indicator to show your best effort, your nurse or respiratory therapist will set it to a desired goal.  If possible, sit up straight or lean slightly forward. Try not to slouch.  Hold the incentive spirometer in an upright position. INSTRUCTIONS FOR USE  1. Sit on the edge of your bed if possible, or sit up as far as you can in bed or on a chair. 2. Hold the incentive spirometer in an upright position. 3. Breathe out normally. 4. Place the mouthpiece in your mouth and seal your lips tightly around it. 5. Breathe in slowly and as deeply as possible, raising the piston or the ball toward the top of the column. 6. Hold your breath for 3-5 seconds or for as long as possible. Allow the piston or ball to fall to the bottom of the column. 7. Remove the mouthpiece from your mouth and breathe out normally. 8. Rest for a few seconds and repeat Steps 1 through 7 at least 10 times every 1-2 hours when you are awake. Take your time and take a few normal breaths between deep breaths. 9. The spirometer may include an indicator to show your best effort. Use the indicator as a goal to work toward during each repetition. 10. After each set of 10 deep breaths, practice coughing to be sure your lungs are clear. If you have an incision (the cut made at the time of surgery), support your incision when coughing by placing a pillow or rolled up towels firmly against it. Once you are able to get out of bed, walk around indoors and cough well. You may stop using the incentive spirometer when instructed by your caregiver.  RISKS AND COMPLICATIONS  Take your time so you do not get dizzy or light-headed.  If you are in pain, you may need to take or ask for pain medication before doing incentive spirometry. It is harder to take a deep breath if you are having pain. AFTER USE  Rest and breathe slowly and  easily.  It can be helpful to keep track of a log of your progress. Your caregiver can provide you with a simple table to help with this. If you are using the spirometer at home, follow these instructions: [place] IF:   You are having difficultly using the spirometer.  You have trouble using the spirometer as often as instructed.  Your pain medication is not giving enough relief while using the spirometer.  You develop fever of 100.5 F (38.1 C) or higher. SEEK IMMEDIATE MEDICAL CARE IF:   You cough up bloody sputum that had not been present before.  You develop fever of 102 F (38.9 C) or greater.  You develop worsening pain at or near the incision site. MAKE SURE YOU:   Understand these instructions.  Will watch your condition.  Will get help right away if you are not doing well or get worse. Document Released: 12/27/2006 Document Revised: 11/08/2011 Document Reviewed: 02/27/2007 [facility] Patient Information 2014 [place], [place].   ________________________________________________________________________

## 2015-12-03 ENCOUNTER — Ambulatory Visit (HOSPITAL_COMMUNITY)
Admission: RE | Admit: 2015-12-03 | Discharge: 2015-12-03 | Disposition: A | Discharge status: Home / Self Care | Payer: Medicare Other | Source: Ambulatory Visit | Attending: Specialist | Admitting: Specialist

## 2015-12-03 ENCOUNTER — Ambulatory Visit (HOSPITAL_COMMUNITY): Payer: Medicare Other | Admitting: Anesthesiology

## 2015-12-03 ENCOUNTER — Encounter (HOSPITAL_COMMUNITY)
Admission: RE | Disposition: A | Discharge status: Home / Self Care | Payer: Self-pay | Source: Ambulatory Visit | Attending: Specialist

## 2015-12-03 ENCOUNTER — Encounter (HOSPITAL_COMMUNITY): Payer: Self-pay | Admitting: *Deleted

## 2015-12-03 DIAGNOSIS — M719 Bursopathy, unspecified: Secondary | ICD-10-CM | POA: Insufficient documentation

## 2015-12-03 DIAGNOSIS — Z8709 Personal history of other diseases of the respiratory system: Secondary | ICD-10-CM | POA: Insufficient documentation

## 2015-12-03 DIAGNOSIS — M7542 Impingement syndrome of left shoulder: Secondary | ICD-10-CM | POA: Diagnosis not present

## 2015-12-03 DIAGNOSIS — M75102 Unspecified rotator cuff tear or rupture of left shoulder, not specified as traumatic: Secondary | ICD-10-CM | POA: Insufficient documentation

## 2015-12-03 DIAGNOSIS — G8918 Other acute postprocedural pain: Secondary | ICD-10-CM | POA: Diagnosis not present

## 2015-12-03 DIAGNOSIS — M65811 Other synovitis and tenosynovitis, right shoulder: Secondary | ICD-10-CM | POA: Insufficient documentation

## 2015-12-03 DIAGNOSIS — M1990 Unspecified osteoarthritis, unspecified site: Secondary | ICD-10-CM | POA: Diagnosis not present

## 2015-12-03 DIAGNOSIS — M25512 Pain in left shoulder: Secondary | ICD-10-CM | POA: Diagnosis present

## 2015-12-03 HISTORY — PX: SHOULDER OPEN ROTATOR CUFF REPAIR: SHX2407

## 2015-12-03 LAB — TYPE AND SCREEN
ABO/RH(D): A NEG
Antibody Screen: NEGATIVE

## 2015-12-03 SURGERY — REPAIR, ROTATOR CUFF, OPEN
Anesthesia: General | Site: Shoulder | Laterality: Left

## 2015-12-03 MED ORDER — ROCURONIUM BROMIDE 100 MG/10ML IV SOLN
INTRAVENOUS | Status: DC | PRN
  Administered 2015-12-03: 40 mg via INTRAVENOUS

## 2015-12-03 MED ORDER — ONDANSETRON HCL 4 MG/2ML IJ SOLN: 4.0000 mg | Freq: Four times a day (QID) | INTRAMUSCULAR | Status: DC

## 2015-12-03 MED ORDER — HYDROMORPHONE HCL 1 MG/ML IJ SOLN: 1.0000 mg | INTRAMUSCULAR | Status: DC | PRN

## 2015-12-03 MED ORDER — BUPIVACAINE-EPINEPHRINE (PF) 0.5% -1:200000 IJ SOLN
INTRAMUSCULAR | Status: AC
  Filled 2015-12-03: qty 30

## 2015-12-03 MED ORDER — MENTHOL 3 MG MT LOZG
1.0000 | LOZENGE | OROMUCOSAL | Status: DC | PRN
  Filled 2015-12-03: qty 9

## 2015-12-03 MED ORDER — DOCUSATE SODIUM 100 MG PO CAPS: 100.0000 mg | ORAL_CAPSULE | Freq: Two times a day (BID) | ORAL | Status: DC

## 2015-12-03 MED ORDER — ACETAMINOPHEN 325 MG PO TABS: 650.0000 mg | ORAL_TABLET | Freq: Four times a day (QID) | ORAL | Status: DC | PRN

## 2015-12-03 MED ORDER — PROPOFOL 10 MG/ML IV BOLUS
INTRAVENOUS | Status: AC
  Filled 2015-12-03: qty 20

## 2015-12-03 MED ORDER — METOCLOPRAMIDE HCL 5 MG/ML IJ SOLN: 5.0000 mg | Freq: Three times a day (TID) | INTRAMUSCULAR | Status: DC | PRN

## 2015-12-03 MED ORDER — ONDANSETRON HCL 4 MG PO TABS
4.0000 mg | ORAL_TABLET | Freq: Four times a day (QID) | ORAL | Status: DC | PRN
  Filled 2015-12-03: qty 1

## 2015-12-03 MED ORDER — ACETAMINOPHEN 650 MG RE SUPP
650.0000 mg | Freq: Four times a day (QID) | RECTAL | Status: DC | PRN
  Filled 2015-12-03: qty 1

## 2015-12-03 MED ORDER — CEFAZOLIN SODIUM-DEXTROSE 2-4 GM/100ML-% IV SOLN
2.0000 g | Freq: Four times a day (QID) | INTRAVENOUS | Status: DC
  Administered 2015-12-03: 2 g via INTRAVENOUS

## 2015-12-03 MED ORDER — MIDAZOLAM HCL 2 MG/2ML IJ SOLN
INTRAMUSCULAR | Status: AC
  Filled 2015-12-03: qty 2

## 2015-12-03 MED ORDER — SUGAMMADEX SODIUM 200 MG/2ML IV SOLN
INTRAVENOUS | Status: AC
  Filled 2015-12-03: qty 2

## 2015-12-03 MED ORDER — ONDANSETRON HCL 4 MG/2ML IJ SOLN
INTRAMUSCULAR | Status: AC
  Filled 2015-12-03: qty 4

## 2015-12-03 MED ORDER — ROCURONIUM BROMIDE 100 MG/10ML IV SOLN
INTRAVENOUS | Status: AC
  Filled 2015-12-03: qty 1

## 2015-12-03 MED ORDER — PHENOL 1.4 % MT LIQD: 1.0000 | OROMUCOSAL | Status: DC | PRN

## 2015-12-03 MED ORDER — OXYCODONE HCL 5 MG PO TABS: 5.0000 mg | ORAL_TABLET | ORAL | Status: DC | PRN

## 2015-12-03 MED ORDER — MIDAZOLAM HCL 5 MG/5ML IJ SOLN
INTRAMUSCULAR | Status: DC | PRN
  Administered 2015-12-03 (×2): 1 mg via INTRAVENOUS

## 2015-12-03 MED ORDER — SUGAMMADEX SODIUM 200 MG/2ML IV SOLN
INTRAVENOUS | Status: DC | PRN
  Administered 2015-12-03: 200 mg via INTRAVENOUS

## 2015-12-03 MED ORDER — CEFAZOLIN SODIUM-DEXTROSE 2-4 GM/100ML-% IV SOLN
INTRAVENOUS | Status: AC
  Filled 2015-12-03: qty 100

## 2015-12-03 MED ORDER — MEPERIDINE HCL 50 MG/ML IJ SOLN: 6.2500 mg | INTRAMUSCULAR | Status: DC | PRN

## 2015-12-03 MED ORDER — DIPHENHYDRAMINE HCL 12.5 MG/5ML PO ELIX
12.5000 mg | ORAL_SOLUTION | ORAL | Status: DC | PRN
  Filled 2015-12-03: qty 10

## 2015-12-03 MED ORDER — EPHEDRINE SULFATE 50 MG/ML IJ SOLN
INTRAMUSCULAR | Status: AC
  Filled 2015-12-03: qty 1

## 2015-12-03 MED ORDER — FENTANYL CITRATE (PF) 100 MCG/2ML IJ SOLN: 25.0000 ug | INTRAMUSCULAR | Status: DC | PRN

## 2015-12-03 MED ORDER — METOCLOPRAMIDE HCL 5 MG PO TABS
5.0000 mg | ORAL_TABLET | Freq: Three times a day (TID) | ORAL | Status: DC | PRN
  Filled 2015-12-03: qty 2

## 2015-12-03 MED ORDER — METHOCARBAMOL 500 MG PO TABS: 500.0000 mg | ORAL_TABLET | Freq: Four times a day (QID) | ORAL | Status: DC | PRN

## 2015-12-03 MED ORDER — GLYCOPYRROLATE 0.2 MG/ML IJ SOLN
INTRAMUSCULAR | Status: AC
  Filled 2015-12-03: qty 1

## 2015-12-03 MED ORDER — ASPIRIN EC 325 MG PO TBEC: 325.0000 mg | DELAYED_RELEASE_TABLET | Freq: Every day | ORAL | Status: DC

## 2015-12-03 MED ORDER — CEFAZOLIN SODIUM-DEXTROSE 2-3 GM-% IV SOLR
INTRAVENOUS | Status: AC
  Filled 2015-12-03: qty 50

## 2015-12-03 MED ORDER — ONDANSETRON HCL 4 MG/2ML IJ SOLN
INTRAMUSCULAR | Status: DC | PRN
  Administered 2015-12-03 (×4): 2 mg via INTRAVENOUS

## 2015-12-03 MED ORDER — METHOCARBAMOL 1000 MG/10ML IJ SOLN
500.0000 mg | Freq: Four times a day (QID) | INTRAMUSCULAR | Status: DC | PRN
  Administered 2015-12-03: 500 mg via INTRAVENOUS
  Filled 2015-12-03 (×2): qty 5

## 2015-12-03 MED ORDER — MAGNESIUM CITRATE PO SOLN
1.0000 | Freq: Once | ORAL | Status: DC | PRN
  Filled 2015-12-03: qty 296

## 2015-12-03 MED ORDER — SODIUM CHLORIDE 0.9 % IR SOLN
Status: AC
  Filled 2015-12-03: qty 1

## 2015-12-03 MED ORDER — OXYCODONE-ACETAMINOPHEN 7.5-325 MG PO TABS: 1.0000 | ORAL_TABLET | ORAL | Status: DC | PRN

## 2015-12-03 MED ORDER — ALUM & MAG HYDROXIDE-SIMETH 200-200-20 MG/5ML PO SUSP
30.0000 mL | ORAL | Status: DC | PRN
  Filled 2015-12-03: qty 30

## 2015-12-03 MED ORDER — ONDANSETRON HCL 4 MG/2ML IJ SOLN: 4.0000 mg | Freq: Four times a day (QID) | INTRAMUSCULAR | Status: DC | PRN

## 2015-12-03 MED ORDER — LIDOCAINE HCL (CARDIAC) 20 MG/ML IV SOLN
INTRAVENOUS | Status: DC | PRN
  Administered 2015-12-03: 80 mg via INTRAVENOUS

## 2015-12-03 MED ORDER — BUPIVACAINE-EPINEPHRINE 0.5% -1:200000 IJ SOLN
INTRAMUSCULAR | Status: DC | PRN
  Administered 2015-12-03: 20 mL

## 2015-12-03 MED ORDER — LIDOCAINE HCL (CARDIAC) 20 MG/ML IV SOLN
INTRAVENOUS | Status: AC
  Filled 2015-12-03: qty 5

## 2015-12-03 MED ORDER — PROPOFOL 10 MG/ML IV BOLUS
INTRAVENOUS | Status: DC | PRN
  Administered 2015-12-03: 200 mg via INTRAVENOUS

## 2015-12-03 MED ORDER — KCL IN DEXTROSE-NACL 20-5-0.45 MEQ/L-%-% IV SOLN
INTRAVENOUS | Status: AC
Start: 2015-12-03 — End: 2015-12-03

## 2015-12-03 MED ORDER — POLYETHYLENE GLYCOL 3350 17 G PO PACK
17.0000 g | PACK | Freq: Every day | ORAL | Status: DC | PRN
  Filled 2015-12-03: qty 1

## 2015-12-03 MED ORDER — GLYCOPYRROLATE 0.2 MG/ML IJ SOLN
INTRAMUSCULAR | Status: DC | PRN
  Administered 2015-12-03: 0.1 mg via INTRAVENOUS

## 2015-12-03 MED ORDER — DEXAMETHASONE SODIUM PHOSPHATE 10 MG/ML IJ SOLN
INTRAMUSCULAR | Status: AC
  Filled 2015-12-03: qty 1

## 2015-12-03 MED ORDER — LACTATED RINGERS IV SOLN
INTRAVENOUS | Status: DC
  Administered 2015-12-03 (×2): via INTRAVENOUS

## 2015-12-03 MED ORDER — KETOROLAC TROMETHAMINE 10 MG PO TABS: 10.0000 mg | ORAL_TABLET | Freq: Four times a day (QID) | ORAL | Status: DC | PRN

## 2015-12-03 MED ORDER — FENTANYL CITRATE (PF) 100 MCG/2ML IJ SOLN
INTRAMUSCULAR | Status: AC
  Filled 2015-12-03: qty 2

## 2015-12-03 MED ORDER — BISACODYL 5 MG PO TBEC
5.0000 mg | DELAYED_RELEASE_TABLET | Freq: Every day | ORAL | Status: DC | PRN
  Filled 2015-12-03: qty 1

## 2015-12-03 MED ORDER — EPHEDRINE SULFATE 50 MG/ML IJ SOLN
INTRAMUSCULAR | Status: DC | PRN
  Administered 2015-12-03 (×2): 10 mg via INTRAVENOUS
  Administered 2015-12-03: 5 mg via INTRAVENOUS
  Administered 2015-12-03: 20 mg via INTRAVENOUS

## 2015-12-03 MED ORDER — FENTANYL CITRATE (PF) 100 MCG/2ML IJ SOLN
INTRAMUSCULAR | Status: DC | PRN
  Administered 2015-12-03 (×2): 50 ug via INTRAVENOUS

## 2015-12-03 MED ORDER — DEXAMETHASONE SODIUM PHOSPHATE 10 MG/ML IJ SOLN
INTRAMUSCULAR | Status: DC | PRN
  Administered 2015-12-03: 5 mg via INTRAVENOUS

## 2015-12-03 MED ORDER — BUPIVACAINE-EPINEPHRINE (PF) 0.5% -1:200000 IJ SOLN
INTRAMUSCULAR | Status: DC | PRN
  Administered 2015-12-03: 25 mL via PERINEURAL

## 2015-12-03 MED ORDER — CEFAZOLIN SODIUM-DEXTROSE 2-4 GM/100ML-% IV SOLN
2.0000 g | INTRAVENOUS | Status: AC
  Administered 2015-12-03: 2 g via INTRAVENOUS
  Filled 2015-12-03: qty 100

## 2015-12-03 SURGICAL SUPPLY — 40 items
ANCH SUT SWLK 19.1X4.75 VT (Anchor) ×1 IMPLANT
ANCHOR PEEK 4.75X19.1 SWLK C (Anchor) ×3 IMPLANT
CLOSURE WOUND 1/2 X4 (GAUZE/BANDAGES/DRESSINGS) ×1
CLOTH 2% CHLOROHEXIDINE 3PK (PERSONAL CARE ITEMS) ×3 IMPLANT
DRAPE POUCH INSTRU U-SHP 10X18 (DRAPES) ×3 IMPLANT
DRSG AQUACEL AG ADV 3.5X 4 (GAUZE/BANDAGES/DRESSINGS) ×2 IMPLANT
DURAPREP 26ML APPLICATOR (WOUND CARE) ×3 IMPLANT
ELECT NEEDLE TIP 2.8 STRL (NEEDLE) ×3 IMPLANT
ELECT REM PT RETURN 9FT ADLT (ELECTROSURGICAL) ×3
ELECTRODE REM PT RTRN 9FT ADLT (ELECTROSURGICAL) ×1 IMPLANT
GLOVE BIOGEL PI IND STRL 7.0 (GLOVE) ×1 IMPLANT
GLOVE BIOGEL PI IND STRL 7.5 (GLOVE) IMPLANT
GLOVE BIOGEL PI IND STRL 8 (GLOVE) ×1 IMPLANT
GLOVE BIOGEL PI INDICATOR 7.0 (GLOVE) ×2
GLOVE BIOGEL PI INDICATOR 7.5 (GLOVE) ×2
GLOVE BIOGEL PI INDICATOR 8 (GLOVE) ×2
GLOVE SURG SS PI 7.0 STRL IVOR (GLOVE) ×5 IMPLANT
GLOVE SURG SS PI 7.5 STRL IVOR (GLOVE) ×5 IMPLANT
GLOVE SURG SS PI 8.0 STRL IVOR (GLOVE) ×3 IMPLANT
GOWN STRL REUS W/ TWL LRG LVL3 (GOWN DISPOSABLE) ×1 IMPLANT
GOWN STRL REUS W/TWL LRG LVL3 (GOWN DISPOSABLE) ×3
GOWN STRL REUS W/TWL XL LVL3 (GOWN DISPOSABLE) ×6 IMPLANT
KIT BASIN OR (CUSTOM PROCEDURE TRAY) ×3 IMPLANT
KIT POSITION SHOULDER SCHLEI (MISCELLANEOUS) ×3 IMPLANT
MANIFOLD NEPTUNE II (INSTRUMENTS) ×3 IMPLANT
MARKER SKIN DUAL TIP RULER LAB (MISCELLANEOUS) ×3 IMPLANT
NDL SCORPION MULTI FIRE (NEEDLE) IMPLANT
NEEDLE SCORPION MULTI FIRE (NEEDLE) ×3 IMPLANT
PACK SHOULDER (CUSTOM PROCEDURE TRAY) ×3 IMPLANT
POSITIONER SURGICAL ARM (MISCELLANEOUS) ×3 IMPLANT
SLING ARM IMMOBILIZER LRG (SOFTGOODS) ×2 IMPLANT
STRIP CLOSURE SKIN 1/2X4 (GAUZE/BANDAGES/DRESSINGS) ×2 IMPLANT
SUCTION FRAZIER HANDLE 12FR (TUBING) ×2
SUCTION TUBE FRAZIER 12FR DISP (TUBING) IMPLANT
SUT PROLENE 3 0 PS 2 (SUTURE) ×3 IMPLANT
SUT TIGER TAPE 7 IN WHITE (SUTURE) ×3 IMPLANT
SUT VIC AB 1-0 CT2 27 (SUTURE) ×3 IMPLANT
SUT VIC AB 2-0 CT2 27 (SUTURE) ×3 IMPLANT
TOWEL OR 17X26 10 PK STRL BLUE (TOWEL DISPOSABLE) ×3 IMPLANT
TOWEL OR NON WOVEN STRL DISP B (DISPOSABLE) ×3 IMPLANT

## 2015-12-03 NOTE — Transfer of Care (Signed)
Immediate Anesthesia Transfer of Care Note  Patient: Jeffrey [name]  Procedure(s) Performed: Procedure(s): LEFT SHOULDER MINI OPEN ROTATOR CUFF REPAIR   (Left)  Patient Location: PACU  Anesthesia Type:General  Level of Consciousness:  sedated, patient cooperative and responds to stimulation  Airway & Oxygen Therapy:Patient Spontanous Breathing and Patient connected to face mask oxgen  Post-op Assessment:  Report given to PACU RN and Post -op Vital signs reviewed and stable  Post vital signs:  Reviewed and stable  Last Vitals:  Filed Vitals:   12/03/15 0925 12/03/15 1228  BP: 134/86 148/82  Pulse: 66 96  Temp: 36.4 C   Resp: 18     Complications: No apparent anesthesia complications

## 2015-12-03 NOTE — Progress Notes (Signed)
PT Cancellation Note  Patient Details Name: Jeffrey [name] MRN: [id number] DOB: [birth year]   Cancelled Treatment:    Reason Eval/Treat Not Completed: PT screened, no needs identified, will sign off  OT screened for PT and reports no needs.  PT to sign off.   Pearson Reasons,KATHrine E 12/03/2015, 3:10 PM Carmelia Bake, PT, DPT 12/03/2015 Pager: [phone]

## 2015-12-03 NOTE — H&P (View-Only) (Signed)
Jeffrey [name] is an 70 y.o. male.   Chief Complaint: L shoulder pain HPI: The patient is a 70 year old male who presents today for follow up of their shoulder. The patient is being followed for their left shoulder pain. They are 4 week(s) out from flare up. Symptoms reported today include: pain. The patient feels that they are doing poorly and report their pain level to be moderate. Current treatment includes: activity modification. The following medication has been used for pain control: none. The patient presents today following a cortisone injection x 7 days. The patient reports the injection did not help.  Not much pain, relief from the injection. He is tender in the anterior subacromial region. Positive impingement sign. Weak in external rotation.   Past Medical History  Diagnosis Date  . Non-smoker   . EMPYEMA 10/01/2008  . Pneumonia     Past Surgical History  Procedure Laterality Date  . Post neck surgery  1966  . Shoulder surgery  1998  . Thoracic sug. due to pneumonia  dec. 2010  . Lumbar disc surgery-2007    . Post neck surgeryx2    . Esophagogastroduodenoscopy  09/01/2011    Procedure: ESOPHAGOGASTRODUODENOSCOPY (EGD);  Surgeon: Landry Dyke, MD;  Location: [facility];  Service: Endoscopy;  Laterality: N/A;  . Balloon dilation  09/01/2011    Procedure: BALLOON DILATION;  Surgeon: Landry Dyke, MD;  Location: [facility];  Service: Endoscopy;  Laterality: N/A;    Family History  Problem Relation Age of Onset  . Lung disease    . Cancer Mother   . Pulmonary fibrosis Mother   . Emphysema Father   . Pulmonary fibrosis Brother    Social History:  reports that he has never smoked. He has never used smokeless tobacco. He reports that he drinks alcohol. He reports that he does not use illicit drugs.  Allergies: No Known Allergies   (Not in a hospital admission)  No results found for this or any previous visit (from the past 48 hour(s)). No results  found.  Review of Systems  Constitutional: Negative.   HENT: Negative.   Eyes: Negative.   Respiratory: Negative.   Cardiovascular: Negative.   Gastrointestinal: Negative.   Genitourinary: Negative.   Musculoskeletal: Positive for joint pain.  Skin: Negative.   Neurological: Negative.     There were no vitals taken for this visit. Physical Exam  Constitutional: He is oriented to person, place, and time. He appears well-developed.  HENT:  Head: Normocephalic.  Eyes: Pupils are equal, round, and reactive to light.  Neck: Normal range of motion.  Cardiovascular: Normal rate.   Respiratory: Effort normal.  GI: Soft.  Musculoskeletal:  On exam, he is well nourished, well developed, awake, alert, and oriented x3, no acute distress. Examination of the left shoulder, he is nontender over the subacromial space, AC joint, [place] joint, and clavicle. Mild tenderness over the trapezius and the deltoid. Positive impingement and secondary impingement testing. He has full forward flexion, internal rotation and abduction, mildly decreased passive external rotation. No instability or laxity noted. Upper extremity strength 5/5 with abduction, triceps, and internal rotation strength testing bilaterally. On the left, he has some trace weakness with external rotation and biceps strength testing. Sensation intact distally.  Neurological: He is alert and oriented to person, place, and time.    Prior x-rays type 3 acromion  MRI L shoulder with high grade infraspinatus tear; AC joint hypertrophy associated with impingement; labral tear and degenerative changes of  the labrum  Assessment/Plan L RCT  We discussed options. He does not want to wait too long. He will proceed with an MRI to rule out a rotator cuff tear, which I indicated would require repair. He said it will take a while until he can get that because he is going away. I injected him with a cortisone injection at this point. After discussing risks  and benefits with the patient, we proceeded with a shoulder injection. I sterilely prepped the posterior subacromial region with Betadine and alcohol and injected the patient with 1 mL of Aristospan and 8 mL of 1% Lidocaine solution with reduction of pain. Post-injection care was discussed with the patient. I will see him back following that. We discussed the differential diagnosis, corresponding treatment algorithm.  Discussed his MRI results and recommend proceeding with L RCR, mini-open, possible patch graft. Discussed procedure itself, risks, complications and alternatives. Pt desires to proceed.  Plan L shoulder mini-open RCR, possible patch graft  Cecilie Kicks., PA-C for Dr. Tonita Cong 11/26/2015, 3:40 PM

## 2015-12-03 NOTE — Interval H&P Note (Signed)
History and Physical Interval Note:  12/03/2015 7:56 AM  Jeffrey [name]  has presented today for surgery, with the diagnosis of left rotator cuff tear  The various methods of treatment have been discussed with the patient and family. After consideration of risks, benefits and other options for treatment, the patient has consented to  Procedure(s): LEFT SHOULDER MINI OPEN ROTATOR CUFF REPAIR AND POSSIBLE PATCH GRAFT  (Left) as a surgical intervention .  The patient's history has been reviewed, patient examined, no change in status, stable for surgery.  I have reviewed the patient's chart and labs.  Questions were answered to the patient's satisfaction.     Mercedez Boule C

## 2015-12-03 NOTE — Anesthesia Postprocedure Evaluation (Signed)
Anesthesia Post Note  Patient: Jeffrey [name]  Procedure(s) Performed: Procedure(s) (LRB): LEFT SHOULDER MINI OPEN ROTATOR CUFF REPAIR   (Left)  Patient location during evaluation: PACU Anesthesia Type: General Level of consciousness: awake and alert Pain management: pain level controlled Vital Signs Assessment: post-procedure vital signs reviewed and stable Respiratory status: spontaneous breathing, nonlabored ventilation, respiratory function stable and patient connected to nasal cannula oxygen Cardiovascular status: blood pressure returned to baseline and stable Postop Assessment: no signs of nausea or vomiting Anesthetic complications: no    Last Vitals:  Filed Vitals:   12/03/15 0925 12/03/15 1228  BP: 134/86 148/82  Pulse: 66 96  Temp: 36.4 C 36.6 C  Resp: 18 22    Last Pain:  Filed Vitals:   12/03/15 1232  PainSc: 0-No pain                 Alexis Frock

## 2015-12-03 NOTE — Brief Op Note (Signed)
12/03/2015  12:15 PM  PATIENT:  Drusilla Kanner  70 y.o. male  PRE-OPERATIVE DIAGNOSIS:  left rotator cuff tear  POST-OPERATIVE DIAGNOSIS:  left rotator cuff tear  PROCEDURE:  Procedure(s): LEFT SHOULDER MINI OPEN ROTATOR CUFF REPAIR   (Left)  SURGEON:  Surgeon(s) and Role:    * Susa Day, MD - Primary  PHYSICIAN ASSISTANT:   ASSISTANTS: [place]   ANESTHESIA:   general  EBL:  Total I/O In: 1000 [I.V.:1000] Out: 50 [Blood:50]  BLOOD ADMINISTERED:none  DRAINS: none   LOCAL MEDICATIONS USED:  MARCAINE     SPECIMEN:  No Specimen  DISPOSITION OF SPECIMEN:  N/A  COUNTS:  YES  TOURNIQUET:  * No tourniquets in log *  DICTATION: .Other Dictation: Dictation Number [id number]  PLAN OF CARE: Discharge to home after PACU  PATIENT DISPOSITION:  PACU - hemodynamically stable.   Delay start of Pharmacological VTE agent (>24hrs) due to surgical blood loss or risk of bleeding: no

## 2015-12-03 NOTE — Anesthesia Procedure Notes (Addendum)
Anesthesia Regional Block:  Interscalene brachial plexus block  Pre-Anesthetic Checklist: ,, timeout performed, Correct Patient, Correct Site, Correct Laterality, Correct Procedure, Correct Position, site marked, Risks and benefits discussed,  Surgical consent,  Pre-op evaluation,  At surgeon's request and post-op pain management  Laterality: Left  Prep: chloraprep       Needles:   Needle Type: Echogenic Stimulator Needle     Needle Length: 13cm 13 cm Needle Gauge: 21 and 21 G    Additional Needles:  Procedures: ultrasound guided (picture in chart) Interscalene brachial plexus block Narrative:  Start time: 12/03/2015 10:55 AM End time: 12/03/2015 11:03 AM Anesthesiologist: Alexis Frock  Additional Notes: Left IS block with 25 cc .5% marcaine with epi.  No complications   Procedure Name: Intubation Date/Time: 12/03/2015 12:00 PM Performed by: Freddie Breech Pre-anesthesia Checklist: Patient identified, Emergency Drugs available, Suction available, Patient being monitored and Timeout performed Patient Re-evaluated:Patient Re-evaluated prior to inductionOxygen Delivery Method: Circle system utilized Preoxygenation: Pre-oxygenation with 100% oxygen Intubation Type: IV induction Ventilation: Mask ventilation without difficulty Laryngoscope Size: Mac and 4 Grade View: Grade II Tube type: Oral Tube size: 7.5 mm Number of attempts: 1 Airway Equipment and Method: Patient positioned with wedge pillow and Stylet Placement Confirmation: ETT inserted through vocal cords under direct vision,  positive ETCO2,  CO2 detector and breath sounds checked- equal and bilateral Secured at: 23 cm Tube secured with: Tape Dental Injury: Teeth and Oropharynx as per pre-operative assessment

## 2015-12-03 NOTE — Discharge Instructions (Signed)
Aquacel dressing may remain in place until follow up. May shower with aquacel dressing in place. If the dressing becomes saturated or peels off, you may remove it and place a new dressing with gauze and tape which should be kept clean and dry and changed daily. Use sling at times except when exercising or showering No driving for 4-6 weeks No lifting for 6 weeks operative arm Pendulum exercises as instructed. Ok to move wrist, elbow, and hand. See Dr. Tonita Cong in 10-14 days. Take one aspirin per day with a meal if not on a blood thinner or allergic to aspirin. Post Anesthesia Home Care Instructions  Activity: Get plenty of rest for the remainder of the day. A responsible adult should stay with you for 24 hours following the procedure.  For the next 24 hours, DO NOT: -Drive a car -Paediatric nurse -Drink alcoholic beverages -Take any medication unless instructed by your physician -Make any legal decisions or sign important papers.  Meals: Start with liquid foods such as gelatin or soup. Progress to regular foods as tolerated. Avoid greasy, spicy, heavy foods. If nausea and/or vomiting occur, drink only clear liquids until the nausea and/or vomiting subsides. Call your physician if vomiting continues.  Special Instructions/Symptoms: Your throat may feel dry or sore from the anesthesia or the breathing tube placed in your throat during surgery. If this causes discomfort, gargle with warm salt water. The discomfort should disappear within 24 hours.  If you had a scopolamine patch placed behind your ear for the management of post- operative nausea and/or vomiting:  1. The medication in the patch is effective for 72 hours, after which it should be removed.  Wrap patch in a tissue and discard in the trash. Wash hands thoroughly with soap and water. 2. You may remove the patch earlier than 72 hours if you experience unpleasant side effects which may include dry mouth, dizziness or visual  disturbances. 3. Avoid touching the patch. Wash your hands with soap and water after contact with the patch.

## 2015-12-03 NOTE — Evaluation (Signed)
Occupational Therapy Evaluation Patient Details Name: Jeffrey [name] MRN: [id number] DOB: 02-20-1946 Today's Date: 12/03/2015    History of Present Illness s/p L RCR   Clinical Impression   This 70 year old man was admitted for the above surgery.  All education was completed.  Pt will follow up with Dr Tonita Cong for further rehab.    Follow Up Recommendations   (pt will follow up with Dr Tonita Cong for further rehab)    Equipment Recommendations  None recommended by OT    Recommendations for Other Services       Precautions / Restrictions Precautions Precautions: Shoulder Type of Shoulder Precautions: passive protcol; pendulums OK Shoulder Interventions: Shoulder sling/immobilizer Required Braces or Orthoses: Sling Restrictions Weight Bearing Restrictions: Yes Other Position/Activity Restrictions: NWB      Mobility Bed Mobility Overal bed mobility: Needs Assistance Bed Mobility: Supine to Sit           General bed mobility comments: min A for trunk to sit up  Transfers Overall transfer level: Independent                    Balance                                            ADL Overall ADL's : Needs assistance/impaired     Grooming: Set up;Sitting   Upper Body Bathing: Moderate assistance;Sitting   Lower Body Bathing: Moderate assistance;Sit to/from stand   Upper Body Dressing : Maximal assistance;Sitting   Lower Body Dressing: Maximal assistance;Sit to/from stand                 General ADL Comments: pt had ambulated to bathroom with nursing without difficulty.  Educated on shoulder protocol, and pt/wife verbalize understanding. Worked through ADL and wife donned sling twice.  Pt had many questions, and all were answered.  Pt does not wear button down shirts. He had a very large Tshirt which we donned over sling to avoid jarring shoulder.  Pt elevated shoulder during session, several times.  Cued to relax muscles. Educated on  general safety:  not taking a shower until he feels steady, keeping both feet on floor and sitting on commode for feet, and having wife stand nearby.  Educated on pendulum exercises--did not practice as block still in effect.  Pt plans to sleep in a recliner at home. see education section of chart     Vision     Perception     Praxis      Pertinent Vitals/Pain Pain Assessment: No/denies pain (block has not worn off except for fingers (not thumb))     Hand Dominance Right   Extremity/Trunk Assessment Upper Extremity Assessment Upper Extremity Assessment: LUE deficits/detail (immobilized; able to wiggle fingers)           Communication Communication Communication: No difficulties   Cognition Arousal/Alertness: Awake/alert Behavior During Therapy: WFL for tasks assessed/performed Overall Cognitive Status: Within Functional Limits for tasks assessed                     General Comments       Exercises       Shoulder Instructions      Home Living Family/patient expects to be discharged to:: Private residence Living Arrangements: Spouse/significant other  Bathroom Shower/Tub: Risk analyst characteristics: Architectural technologist: Standard                Prior Functioning/Environment Level of Independence: Independent             OT Diagnosis: Generalized weakness   OT Problem List:     OT Treatment/Interventions:      OT Goals(Current goals can be found in the care plan section) Acute Rehab OT Goals Patient Stated Goal: get back to doing whatever I want OT Goal Formulation: All assessment and education complete, DC therapy  OT Frequency:     Barriers to D/C:            Co-evaluation              End of Session    Activity Tolerance: Patient tolerated treatment well Patient left:  (EOB, RN following me)   Time: [id number] OT Time Calculation (min): 36 min Charges:  OT General Charges $OT  Visit: 1 Procedure OT Evaluation $OT Eval Low Complexity: 1 Procedure OT Treatments $Self Care/Home Management : 8-22 mins G-Codes: OT G-codes **NOT FOR INPATIENT CLASS** Functional Assessment Tool Used: clinical observation and judgment Functional Limitation: Self care Self Care Current Status [id number]): At least 60 percent but less than 80 percent impaired, limited or restricted Self Care Goal Status [id number]): At least 60 percent but less than 80 percent impaired, limited or restricted Self Care Discharge Status [phone]): At least 60 percent but less than 80 percent impaired, limited or restricted  [name] 12/03/2015, 3:19 PM  Lesle Chris, OTR/L [phone] 12/03/2015

## 2015-12-03 NOTE — Anesthesia Preprocedure Evaluation (Addendum)
Anesthesia Evaluation  Patient identified by MRN, date of birth, ID band Patient awake    Reviewed: Allergy & Precautions, NPO status , Patient's Chart, lab work & pertinent test results  Airway Mallampati: II   Neck ROM: Full    Dental  (+) Dental Advisory Given, Teeth Intact   Pulmonary neg pulmonary ROS,    breath sounds clear to auscultation       Cardiovascular negative cardio ROS   Rhythm:Regular     Neuro/Psych negative neurological ROS  negative psych ROS   GI/Hepatic negative GI ROS, Neg liver ROS,   Endo/Other  negative endocrine ROS  Renal/GU negative Renal ROS  negative genitourinary   Musculoskeletal negative musculoskeletal ROS (+) Arthritis ,   Abdominal   Peds negative pediatric ROS (+)  Hematology negative hematology ROS (+) 13/38   Anesthesia Other Findings   Reproductive/Obstetrics negative OB ROS                            Anesthesia Physical Anesthesia Plan  ASA: II  Anesthesia Plan: General   Post-op Pain Management:  Regional for Post-op pain   Induction: Intravenous  Airway Management Planned: Oral ETT  Additional Equipment:   Intra-op Plan:   Post-operative Plan: Extubation in OR  Informed Consent: I have reviewed the patients History and Physical, chart, labs and discussed the procedure including the risks, benefits and alternatives for the proposed anesthesia with the patient or authorized representative who has indicated his/her understanding and acceptance.     Plan Discussed with:   Anesthesia Plan Comments:         Anesthesia Quick Evaluation

## 2015-12-04 NOTE — Op Note (Signed)
NAME:  Jeffrey [name], Jeffrey [name]                     ACCOUNT NO.:  MEDICAL RECORD NO.:  [id number]  LOCATION:                                 FACILITY:  PHYSICIAN:  Susa Day, M.D.    DATE OF BIRTH:  November 03, 1945  DATE OF PROCEDURE: DATE OF DISCHARGE:                              OPERATIVE REPORT   PREOPERATIVE DIAGNOSES:  Rotator cuff tear, left shoulder impingement syndrome.  POSTOPERATIVE DIAGNOSIS:  Rotator cuff tear, left shoulder impingement syndrome.  PROCEDURE PERFORMED:  Mini-open rotator cuff repair and subacromial decompression, acromioplasty.  ANESTHESIA:  General.  ASSISTANT:  Lacie Draft, PA  HISTORY:  A 69, shoulder pain, infraspinatus tear, full-thickness indicated for repair, also impingement.  Risks and benefits were discussed including bleeding, infection, no change in symptoms, worsening symptoms, DVT, PE, anesthetic complications, etc.  TECHNIQUE:  The patient in supine beach-chair position, after induction of adequate general anesthesia, 2 g Kefzol left shoulder and upper extremities, prepped and draped in usual sterile fashion.  He had full range of motion under anesthesia.  Surgical marker utilized along the acromion AC joint coracoid.  A small incision was made over the anterior lateral aspect of the acromion, 2 cm in length.  Subcutaneous tissue was dissected.  Electrocautery was utilized to achieve hemostasis.  Raphe between the anterolateral heads was identified, divided in line with skin incision.  Self-retaining retractor was placed.  We incised again the raphe and placed a self-retaining retractor.  CA ligament was attached, excised the spur off the anterior lateral aspect of the acromion, was removed with 3 mm Kerrison.  I digitally lysed adhesions in the subacromial space.  He had extensive hypertrophic synovitis and bursitis.  We meticulously excised the bursa and oblique tear through the infraspinatus, attachment was noted posteriorly and  midportion of the infraspinatus.  This was mobilized.  A small trough in the lateral aspect of the greater tuberosity was then made with an elevator.  We then passed a fiber tape through the leading edge of this infraspinatus oblique tear with a Scorpion suture passer, crossed it and over the side and then placed it into the greater tuberosity with a SwiveLock after fashioning with an awl over the greater tuberosity and midpoint of the humerus identifying the boundaries anteriorly and posteriorly.  We then inserted the suture ends into the SwiveLock without undue tension and then into the pilot hole with greater tuberosity.  Excellent resistance to pull out.  This was fully seated.  We tested, it was flushed with the cortex of the humerus.  Full coverage of the tear and well secured.  The remainder of the cuff was unremarkable, copiously irrigated the wound. We closed the raphe with 1 Vicryl, subcu with 2-0, and skin with Prolene.  Sterile dressing applied, placed in a sling, extubated without difficulty, and transported to the recovery room in satisfactory condition.  The patient tolerated the procedure well.  No complications.  Assistant, Lacie Draft, [place].  Minimal blood loss.     Susa Day, M.D.     Geralynn Rile  D:  12/03/2015  T:  12/04/2015  Job:  [id number]

## 2015-12-09 NOTE — Addendum Note (Signed)
Addendum  created 12/09/15 [id number] by Lollie Sails, CRNA   Modules edited: Charges VN

## 2015-12-15 DIAGNOSIS — Z9889 Other specified postprocedural states: Secondary | ICD-10-CM | POA: Diagnosis not present

## 2015-12-15 DIAGNOSIS — Z4789 Encounter for other orthopedic aftercare: Secondary | ICD-10-CM | POA: Diagnosis not present

## 2015-12-29 DIAGNOSIS — M7542 Impingement syndrome of left shoulder: Secondary | ICD-10-CM | POA: Diagnosis not present

## 2016-01-05 DIAGNOSIS — M7542 Impingement syndrome of left shoulder: Secondary | ICD-10-CM | POA: Diagnosis not present

## 2016-01-07 DIAGNOSIS — M7542 Impingement syndrome of left shoulder: Secondary | ICD-10-CM | POA: Diagnosis not present

## 2016-01-12 DIAGNOSIS — M7542 Impingement syndrome of left shoulder: Secondary | ICD-10-CM | POA: Diagnosis not present

## 2016-01-14 DIAGNOSIS — M7542 Impingement syndrome of left shoulder: Secondary | ICD-10-CM | POA: Diagnosis not present

## 2016-01-27 DIAGNOSIS — M7542 Impingement syndrome of left shoulder: Secondary | ICD-10-CM | POA: Diagnosis not present

## 2016-01-29 DIAGNOSIS — M7542 Impingement syndrome of left shoulder: Secondary | ICD-10-CM | POA: Diagnosis not present

## 2016-02-02 DIAGNOSIS — Z9889 Other specified postprocedural states: Secondary | ICD-10-CM | POA: Diagnosis not present

## 2016-02-02 DIAGNOSIS — M7542 Impingement syndrome of left shoulder: Secondary | ICD-10-CM | POA: Diagnosis not present

## 2016-02-04 DIAGNOSIS — M7542 Impingement syndrome of left shoulder: Secondary | ICD-10-CM | POA: Diagnosis not present

## 2016-02-09 DIAGNOSIS — M7542 Impingement syndrome of left shoulder: Secondary | ICD-10-CM | POA: Diagnosis not present

## 2016-02-11 DIAGNOSIS — M7542 Impingement syndrome of left shoulder: Secondary | ICD-10-CM | POA: Diagnosis not present

## 2016-02-16 DIAGNOSIS — M7542 Impingement syndrome of left shoulder: Secondary | ICD-10-CM | POA: Diagnosis not present

## 2016-02-18 DIAGNOSIS — M7542 Impingement syndrome of left shoulder: Secondary | ICD-10-CM | POA: Diagnosis not present

## 2016-02-23 DIAGNOSIS — M7542 Impingement syndrome of left shoulder: Secondary | ICD-10-CM | POA: Diagnosis not present

## 2016-02-25 DIAGNOSIS — M7542 Impingement syndrome of left shoulder: Secondary | ICD-10-CM | POA: Diagnosis not present

## 2016-02-25 DIAGNOSIS — Z4789 Encounter for other orthopedic aftercare: Secondary | ICD-10-CM | POA: Diagnosis not present

## 2016-03-01 DIAGNOSIS — M7542 Impingement syndrome of left shoulder: Secondary | ICD-10-CM | POA: Diagnosis not present

## 2016-03-03 DIAGNOSIS — M7542 Impingement syndrome of left shoulder: Secondary | ICD-10-CM | POA: Diagnosis not present

## 2016-03-10 DIAGNOSIS — Z6833 Body mass index (BMI) 33.0-33.9, adult: Secondary | ICD-10-CM | POA: Diagnosis not present

## 2016-03-10 DIAGNOSIS — L259 Unspecified contact dermatitis, unspecified cause: Secondary | ICD-10-CM | POA: Diagnosis not present

## 2016-03-16 DIAGNOSIS — M47816 Spondylosis without myelopathy or radiculopathy, lumbar region: Secondary | ICD-10-CM | POA: Diagnosis not present

## 2016-07-10 DIAGNOSIS — Z23 Encounter for immunization: Secondary | ICD-10-CM | POA: Diagnosis not present

## 2016-08-27 DIAGNOSIS — G609 Hereditary and idiopathic neuropathy, unspecified: Secondary | ICD-10-CM | POA: Diagnosis not present

## 2016-08-27 DIAGNOSIS — Z0000 Encounter for general adult medical examination without abnormal findings: Secondary | ICD-10-CM | POA: Diagnosis not present

## 2016-08-27 DIAGNOSIS — Z125 Encounter for screening for malignant neoplasm of prostate: Secondary | ICD-10-CM | POA: Diagnosis not present

## 2016-09-03 DIAGNOSIS — Z6835 Body mass index (BMI) 35.0-35.9, adult: Secondary | ICD-10-CM | POA: Diagnosis not present

## 2016-09-03 DIAGNOSIS — Z0000 Encounter for general adult medical examination without abnormal findings: Secondary | ICD-10-CM | POA: Diagnosis not present

## 2016-09-03 DIAGNOSIS — E668 Other obesity: Secondary | ICD-10-CM | POA: Diagnosis not present

## 2016-09-03 DIAGNOSIS — Z23 Encounter for immunization: Secondary | ICD-10-CM | POA: Diagnosis not present

## 2016-09-03 DIAGNOSIS — J449 Chronic obstructive pulmonary disease, unspecified: Secondary | ICD-10-CM | POA: Diagnosis not present

## 2016-09-03 DIAGNOSIS — Z86010 Personal history of colonic polyps: Secondary | ICD-10-CM | POA: Diagnosis not present

## 2016-09-03 DIAGNOSIS — G609 Hereditary and idiopathic neuropathy, unspecified: Secondary | ICD-10-CM | POA: Diagnosis not present

## 2016-09-03 DIAGNOSIS — M722 Plantar fascial fibromatosis: Secondary | ICD-10-CM | POA: Diagnosis not present

## 2016-09-08 DIAGNOSIS — M4316 Spondylolisthesis, lumbar region: Secondary | ICD-10-CM | POA: Diagnosis not present

## 2016-09-08 DIAGNOSIS — M5136 Other intervertebral disc degeneration, lumbar region: Secondary | ICD-10-CM | POA: Diagnosis not present

## 2016-09-08 DIAGNOSIS — M47816 Spondylosis without myelopathy or radiculopathy, lumbar region: Secondary | ICD-10-CM | POA: Diagnosis not present

## 2016-09-16 DIAGNOSIS — Z1212 Encounter for screening for malignant neoplasm of rectum: Secondary | ICD-10-CM | POA: Diagnosis not present

## 2016-09-28 DIAGNOSIS — J111 Influenza due to unidentified influenza virus with other respiratory manifestations: Secondary | ICD-10-CM | POA: Diagnosis not present

## 2016-12-28 DIAGNOSIS — M47816 Spondylosis without myelopathy or radiculopathy, lumbar region: Secondary | ICD-10-CM | POA: Diagnosis not present

## 2016-12-28 DIAGNOSIS — M4316 Spondylolisthesis, lumbar region: Secondary | ICD-10-CM | POA: Diagnosis not present

## 2016-12-28 DIAGNOSIS — M5136 Other intervertebral disc degeneration, lumbar region: Secondary | ICD-10-CM | POA: Diagnosis not present

## 2017-01-25 DIAGNOSIS — M5137 Other intervertebral disc degeneration, lumbosacral region: Secondary | ICD-10-CM | POA: Diagnosis not present

## 2017-01-25 DIAGNOSIS — M4316 Spondylolisthesis, lumbar region: Secondary | ICD-10-CM | POA: Diagnosis not present

## 2017-02-04 DIAGNOSIS — M5137 Other intervertebral disc degeneration, lumbosacral region: Secondary | ICD-10-CM | POA: Diagnosis not present

## 2017-02-04 DIAGNOSIS — M545 Low back pain: Secondary | ICD-10-CM | POA: Diagnosis not present

## 2017-04-05 DIAGNOSIS — M5416 Radiculopathy, lumbar region: Secondary | ICD-10-CM | POA: Diagnosis not present

## 2017-05-10 DIAGNOSIS — H2513 Age-related nuclear cataract, bilateral: Secondary | ICD-10-CM | POA: Diagnosis not present

## 2017-05-10 DIAGNOSIS — M5416 Radiculopathy, lumbar region: Secondary | ICD-10-CM | POA: Diagnosis not present

## 2017-05-10 DIAGNOSIS — S0501XA Injury of conjunctiva and corneal abrasion without foreign body, right eye, initial encounter: Secondary | ICD-10-CM | POA: Diagnosis not present

## 2017-05-11 DIAGNOSIS — S0501XA Injury of conjunctiva and corneal abrasion without foreign body, right eye, initial encounter: Secondary | ICD-10-CM | POA: Diagnosis not present

## 2017-05-11 DIAGNOSIS — H2513 Age-related nuclear cataract, bilateral: Secondary | ICD-10-CM | POA: Diagnosis not present

## 2017-05-16 DIAGNOSIS — H2513 Age-related nuclear cataract, bilateral: Secondary | ICD-10-CM | POA: Diagnosis not present

## 2017-05-16 DIAGNOSIS — S0501XA Injury of conjunctiva and corneal abrasion without foreign body, right eye, initial encounter: Secondary | ICD-10-CM | POA: Diagnosis not present

## 2017-06-11 DIAGNOSIS — Z23 Encounter for immunization: Secondary | ICD-10-CM | POA: Diagnosis not present

## 2017-06-13 DIAGNOSIS — H2513 Age-related nuclear cataract, bilateral: Secondary | ICD-10-CM | POA: Diagnosis not present

## 2017-06-13 DIAGNOSIS — H04123 Dry eye syndrome of bilateral lacrimal glands: Secondary | ICD-10-CM | POA: Diagnosis not present

## 2017-09-02 DIAGNOSIS — R82998 Other abnormal findings in urine: Secondary | ICD-10-CM | POA: Diagnosis not present

## 2017-09-02 DIAGNOSIS — Z0000 Encounter for general adult medical examination without abnormal findings: Secondary | ICD-10-CM | POA: Diagnosis not present

## 2017-09-02 DIAGNOSIS — Z125 Encounter for screening for malignant neoplasm of prostate: Secondary | ICD-10-CM | POA: Diagnosis not present

## 2017-09-09 DIAGNOSIS — Z1389 Encounter for screening for other disorder: Secondary | ICD-10-CM | POA: Diagnosis not present

## 2017-09-09 DIAGNOSIS — G609 Hereditary and idiopathic neuropathy, unspecified: Secondary | ICD-10-CM | POA: Diagnosis not present

## 2017-09-09 DIAGNOSIS — J449 Chronic obstructive pulmonary disease, unspecified: Secondary | ICD-10-CM | POA: Diagnosis not present

## 2017-09-09 DIAGNOSIS — R0609 Other forms of dyspnea: Secondary | ICD-10-CM | POA: Diagnosis not present

## 2017-09-09 DIAGNOSIS — M5416 Radiculopathy, lumbar region: Secondary | ICD-10-CM | POA: Diagnosis not present

## 2017-09-09 DIAGNOSIS — Z6837 Body mass index (BMI) 37.0-37.9, adult: Secondary | ICD-10-CM | POA: Diagnosis not present

## 2017-09-09 DIAGNOSIS — Z0000 Encounter for general adult medical examination without abnormal findings: Secondary | ICD-10-CM | POA: Diagnosis not present

## 2017-09-09 DIAGNOSIS — R05 Cough: Secondary | ICD-10-CM | POA: Diagnosis not present

## 2017-09-09 DIAGNOSIS — E668 Other obesity: Secondary | ICD-10-CM | POA: Diagnosis not present

## 2017-09-13 DIAGNOSIS — Z1212 Encounter for screening for malignant neoplasm of rectum: Secondary | ICD-10-CM | POA: Diagnosis not present

## 2018-02-01 DIAGNOSIS — G609 Hereditary and idiopathic neuropathy, unspecified: Secondary | ICD-10-CM | POA: Diagnosis not present

## 2018-04-28 DIAGNOSIS — M79672 Pain in left foot: Secondary | ICD-10-CM | POA: Diagnosis not present

## 2018-04-28 DIAGNOSIS — G609 Hereditary and idiopathic neuropathy, unspecified: Secondary | ICD-10-CM | POA: Diagnosis not present

## 2018-04-28 DIAGNOSIS — M79671 Pain in right foot: Secondary | ICD-10-CM | POA: Diagnosis not present

## 2018-06-12 DIAGNOSIS — Q72812 Congenital shortening of left lower limb: Secondary | ICD-10-CM | POA: Diagnosis not present

## 2018-06-12 DIAGNOSIS — G603 Idiopathic progressive neuropathy: Secondary | ICD-10-CM | POA: Diagnosis not present

## 2018-06-12 DIAGNOSIS — M9905 Segmental and somatic dysfunction of pelvic region: Secondary | ICD-10-CM | POA: Diagnosis not present

## 2018-06-12 DIAGNOSIS — M9903 Segmental and somatic dysfunction of lumbar region: Secondary | ICD-10-CM | POA: Diagnosis not present

## 2018-06-13 DIAGNOSIS — M9903 Segmental and somatic dysfunction of lumbar region: Secondary | ICD-10-CM | POA: Diagnosis not present

## 2018-06-13 DIAGNOSIS — M9905 Segmental and somatic dysfunction of pelvic region: Secondary | ICD-10-CM | POA: Diagnosis not present

## 2018-06-13 DIAGNOSIS — Q72812 Congenital shortening of left lower limb: Secondary | ICD-10-CM | POA: Diagnosis not present

## 2018-06-13 DIAGNOSIS — G603 Idiopathic progressive neuropathy: Secondary | ICD-10-CM | POA: Diagnosis not present

## 2018-06-15 DIAGNOSIS — G603 Idiopathic progressive neuropathy: Secondary | ICD-10-CM | POA: Diagnosis not present

## 2018-06-15 DIAGNOSIS — M9905 Segmental and somatic dysfunction of pelvic region: Secondary | ICD-10-CM | POA: Diagnosis not present

## 2018-06-15 DIAGNOSIS — M9903 Segmental and somatic dysfunction of lumbar region: Secondary | ICD-10-CM | POA: Diagnosis not present

## 2018-06-15 DIAGNOSIS — Q72812 Congenital shortening of left lower limb: Secondary | ICD-10-CM | POA: Diagnosis not present

## 2018-06-19 DIAGNOSIS — M9905 Segmental and somatic dysfunction of pelvic region: Secondary | ICD-10-CM | POA: Diagnosis not present

## 2018-06-19 DIAGNOSIS — G603 Idiopathic progressive neuropathy: Secondary | ICD-10-CM | POA: Diagnosis not present

## 2018-06-19 DIAGNOSIS — Q72812 Congenital shortening of left lower limb: Secondary | ICD-10-CM | POA: Diagnosis not present

## 2018-06-19 DIAGNOSIS — M9903 Segmental and somatic dysfunction of lumbar region: Secondary | ICD-10-CM | POA: Diagnosis not present

## 2018-06-20 DIAGNOSIS — Q72812 Congenital shortening of left lower limb: Secondary | ICD-10-CM | POA: Diagnosis not present

## 2018-06-20 DIAGNOSIS — H2513 Age-related nuclear cataract, bilateral: Secondary | ICD-10-CM | POA: Diagnosis not present

## 2018-06-20 DIAGNOSIS — M9905 Segmental and somatic dysfunction of pelvic region: Secondary | ICD-10-CM | POA: Diagnosis not present

## 2018-06-20 DIAGNOSIS — G603 Idiopathic progressive neuropathy: Secondary | ICD-10-CM | POA: Diagnosis not present

## 2018-06-20 DIAGNOSIS — M9903 Segmental and somatic dysfunction of lumbar region: Secondary | ICD-10-CM | POA: Diagnosis not present

## 2018-06-20 DIAGNOSIS — H04123 Dry eye syndrome of bilateral lacrimal glands: Secondary | ICD-10-CM | POA: Diagnosis not present

## 2018-06-22 DIAGNOSIS — M9903 Segmental and somatic dysfunction of lumbar region: Secondary | ICD-10-CM | POA: Diagnosis not present

## 2018-06-22 DIAGNOSIS — G603 Idiopathic progressive neuropathy: Secondary | ICD-10-CM | POA: Diagnosis not present

## 2018-06-22 DIAGNOSIS — M9905 Segmental and somatic dysfunction of pelvic region: Secondary | ICD-10-CM | POA: Diagnosis not present

## 2018-06-22 DIAGNOSIS — Q72812 Congenital shortening of left lower limb: Secondary | ICD-10-CM | POA: Diagnosis not present

## 2018-06-26 DIAGNOSIS — G603 Idiopathic progressive neuropathy: Secondary | ICD-10-CM | POA: Diagnosis not present

## 2018-06-26 DIAGNOSIS — Q72812 Congenital shortening of left lower limb: Secondary | ICD-10-CM | POA: Diagnosis not present

## 2018-06-26 DIAGNOSIS — M9903 Segmental and somatic dysfunction of lumbar region: Secondary | ICD-10-CM | POA: Diagnosis not present

## 2018-06-26 DIAGNOSIS — M9905 Segmental and somatic dysfunction of pelvic region: Secondary | ICD-10-CM | POA: Diagnosis not present

## 2018-06-27 DIAGNOSIS — Q72812 Congenital shortening of left lower limb: Secondary | ICD-10-CM | POA: Diagnosis not present

## 2018-06-27 DIAGNOSIS — G603 Idiopathic progressive neuropathy: Secondary | ICD-10-CM | POA: Diagnosis not present

## 2018-06-27 DIAGNOSIS — M9905 Segmental and somatic dysfunction of pelvic region: Secondary | ICD-10-CM | POA: Diagnosis not present

## 2018-06-27 DIAGNOSIS — M9903 Segmental and somatic dysfunction of lumbar region: Secondary | ICD-10-CM | POA: Diagnosis not present

## 2018-06-29 DIAGNOSIS — M9903 Segmental and somatic dysfunction of lumbar region: Secondary | ICD-10-CM | POA: Diagnosis not present

## 2018-06-29 DIAGNOSIS — Q72812 Congenital shortening of left lower limb: Secondary | ICD-10-CM | POA: Diagnosis not present

## 2018-06-29 DIAGNOSIS — G603 Idiopathic progressive neuropathy: Secondary | ICD-10-CM | POA: Diagnosis not present

## 2018-06-29 DIAGNOSIS — M9905 Segmental and somatic dysfunction of pelvic region: Secondary | ICD-10-CM | POA: Diagnosis not present

## 2018-07-03 DIAGNOSIS — M9905 Segmental and somatic dysfunction of pelvic region: Secondary | ICD-10-CM | POA: Diagnosis not present

## 2018-07-03 DIAGNOSIS — Q72812 Congenital shortening of left lower limb: Secondary | ICD-10-CM | POA: Diagnosis not present

## 2018-07-03 DIAGNOSIS — M9903 Segmental and somatic dysfunction of lumbar region: Secondary | ICD-10-CM | POA: Diagnosis not present

## 2018-07-03 DIAGNOSIS — G603 Idiopathic progressive neuropathy: Secondary | ICD-10-CM | POA: Diagnosis not present

## 2018-07-04 DIAGNOSIS — G603 Idiopathic progressive neuropathy: Secondary | ICD-10-CM | POA: Diagnosis not present

## 2018-07-04 DIAGNOSIS — M9903 Segmental and somatic dysfunction of lumbar region: Secondary | ICD-10-CM | POA: Diagnosis not present

## 2018-07-04 DIAGNOSIS — Q72812 Congenital shortening of left lower limb: Secondary | ICD-10-CM | POA: Diagnosis not present

## 2018-07-04 DIAGNOSIS — M9905 Segmental and somatic dysfunction of pelvic region: Secondary | ICD-10-CM | POA: Diagnosis not present

## 2018-07-06 DIAGNOSIS — Q72812 Congenital shortening of left lower limb: Secondary | ICD-10-CM | POA: Diagnosis not present

## 2018-07-06 DIAGNOSIS — M9903 Segmental and somatic dysfunction of lumbar region: Secondary | ICD-10-CM | POA: Diagnosis not present

## 2018-07-06 DIAGNOSIS — M9905 Segmental and somatic dysfunction of pelvic region: Secondary | ICD-10-CM | POA: Diagnosis not present

## 2018-07-06 DIAGNOSIS — G603 Idiopathic progressive neuropathy: Secondary | ICD-10-CM | POA: Diagnosis not present

## 2018-07-10 DIAGNOSIS — M9905 Segmental and somatic dysfunction of pelvic region: Secondary | ICD-10-CM | POA: Diagnosis not present

## 2018-07-10 DIAGNOSIS — G603 Idiopathic progressive neuropathy: Secondary | ICD-10-CM | POA: Diagnosis not present

## 2018-07-10 DIAGNOSIS — M9903 Segmental and somatic dysfunction of lumbar region: Secondary | ICD-10-CM | POA: Diagnosis not present

## 2018-07-10 DIAGNOSIS — Q72812 Congenital shortening of left lower limb: Secondary | ICD-10-CM | POA: Diagnosis not present

## 2018-07-11 DIAGNOSIS — G603 Idiopathic progressive neuropathy: Secondary | ICD-10-CM | POA: Diagnosis not present

## 2018-07-11 DIAGNOSIS — M9905 Segmental and somatic dysfunction of pelvic region: Secondary | ICD-10-CM | POA: Diagnosis not present

## 2018-07-11 DIAGNOSIS — Q72812 Congenital shortening of left lower limb: Secondary | ICD-10-CM | POA: Diagnosis not present

## 2018-07-11 DIAGNOSIS — M9903 Segmental and somatic dysfunction of lumbar region: Secondary | ICD-10-CM | POA: Diagnosis not present

## 2018-07-13 DIAGNOSIS — M9905 Segmental and somatic dysfunction of pelvic region: Secondary | ICD-10-CM | POA: Diagnosis not present

## 2018-07-13 DIAGNOSIS — M9903 Segmental and somatic dysfunction of lumbar region: Secondary | ICD-10-CM | POA: Diagnosis not present

## 2018-07-13 DIAGNOSIS — G603 Idiopathic progressive neuropathy: Secondary | ICD-10-CM | POA: Diagnosis not present

## 2018-07-13 DIAGNOSIS — Q72812 Congenital shortening of left lower limb: Secondary | ICD-10-CM | POA: Diagnosis not present

## 2018-07-17 DIAGNOSIS — M9903 Segmental and somatic dysfunction of lumbar region: Secondary | ICD-10-CM | POA: Diagnosis not present

## 2018-07-17 DIAGNOSIS — M9905 Segmental and somatic dysfunction of pelvic region: Secondary | ICD-10-CM | POA: Diagnosis not present

## 2018-07-17 DIAGNOSIS — G603 Idiopathic progressive neuropathy: Secondary | ICD-10-CM | POA: Diagnosis not present

## 2018-07-17 DIAGNOSIS — Q72812 Congenital shortening of left lower limb: Secondary | ICD-10-CM | POA: Diagnosis not present

## 2018-07-20 DIAGNOSIS — G603 Idiopathic progressive neuropathy: Secondary | ICD-10-CM | POA: Diagnosis not present

## 2018-07-20 DIAGNOSIS — M9905 Segmental and somatic dysfunction of pelvic region: Secondary | ICD-10-CM | POA: Diagnosis not present

## 2018-07-20 DIAGNOSIS — M9903 Segmental and somatic dysfunction of lumbar region: Secondary | ICD-10-CM | POA: Diagnosis not present

## 2018-07-20 DIAGNOSIS — Q72812 Congenital shortening of left lower limb: Secondary | ICD-10-CM | POA: Diagnosis not present

## 2018-07-24 DIAGNOSIS — M9903 Segmental and somatic dysfunction of lumbar region: Secondary | ICD-10-CM | POA: Diagnosis not present

## 2018-07-24 DIAGNOSIS — Q72812 Congenital shortening of left lower limb: Secondary | ICD-10-CM | POA: Diagnosis not present

## 2018-07-24 DIAGNOSIS — M9905 Segmental and somatic dysfunction of pelvic region: Secondary | ICD-10-CM | POA: Diagnosis not present

## 2018-07-24 DIAGNOSIS — G603 Idiopathic progressive neuropathy: Secondary | ICD-10-CM | POA: Diagnosis not present

## 2018-07-31 DIAGNOSIS — G603 Idiopathic progressive neuropathy: Secondary | ICD-10-CM | POA: Diagnosis not present

## 2018-07-31 DIAGNOSIS — M9905 Segmental and somatic dysfunction of pelvic region: Secondary | ICD-10-CM | POA: Diagnosis not present

## 2018-07-31 DIAGNOSIS — Q72812 Congenital shortening of left lower limb: Secondary | ICD-10-CM | POA: Diagnosis not present

## 2018-07-31 DIAGNOSIS — M9903 Segmental and somatic dysfunction of lumbar region: Secondary | ICD-10-CM | POA: Diagnosis not present

## 2018-08-10 DIAGNOSIS — G603 Idiopathic progressive neuropathy: Secondary | ICD-10-CM | POA: Diagnosis not present

## 2018-08-10 DIAGNOSIS — M9905 Segmental and somatic dysfunction of pelvic region: Secondary | ICD-10-CM | POA: Diagnosis not present

## 2018-08-10 DIAGNOSIS — M9903 Segmental and somatic dysfunction of lumbar region: Secondary | ICD-10-CM | POA: Diagnosis not present

## 2018-08-10 DIAGNOSIS — Q72812 Congenital shortening of left lower limb: Secondary | ICD-10-CM | POA: Diagnosis not present

## 2018-09-06 DIAGNOSIS — R82998 Other abnormal findings in urine: Secondary | ICD-10-CM | POA: Diagnosis not present

## 2018-09-06 DIAGNOSIS — Z125 Encounter for screening for malignant neoplasm of prostate: Secondary | ICD-10-CM | POA: Diagnosis not present

## 2018-09-06 DIAGNOSIS — Z79899 Other long term (current) drug therapy: Secondary | ICD-10-CM | POA: Diagnosis not present

## 2018-09-14 DIAGNOSIS — G608 Other hereditary and idiopathic neuropathies: Secondary | ICD-10-CM | POA: Diagnosis not present

## 2018-09-14 DIAGNOSIS — G609 Hereditary and idiopathic neuropathy, unspecified: Secondary | ICD-10-CM | POA: Diagnosis not present

## 2018-09-14 DIAGNOSIS — E668 Other obesity: Secondary | ICD-10-CM | POA: Diagnosis not present

## 2018-09-14 DIAGNOSIS — M5416 Radiculopathy, lumbar region: Secondary | ICD-10-CM | POA: Diagnosis not present

## 2018-09-14 DIAGNOSIS — Z1389 Encounter for screening for other disorder: Secondary | ICD-10-CM | POA: Diagnosis not present

## 2018-09-14 DIAGNOSIS — Z6838 Body mass index (BMI) 38.0-38.9, adult: Secondary | ICD-10-CM | POA: Diagnosis not present

## 2018-09-14 DIAGNOSIS — I498 Other specified cardiac arrhythmias: Secondary | ICD-10-CM | POA: Diagnosis not present

## 2018-09-14 DIAGNOSIS — J449 Chronic obstructive pulmonary disease, unspecified: Secondary | ICD-10-CM | POA: Diagnosis not present

## 2018-09-14 DIAGNOSIS — Z86010 Personal history of colonic polyps: Secondary | ICD-10-CM | POA: Diagnosis not present

## 2018-09-14 DIAGNOSIS — Z0000 Encounter for general adult medical examination without abnormal findings: Secondary | ICD-10-CM | POA: Diagnosis not present

## 2018-09-20 DIAGNOSIS — Z1212 Encounter for screening for malignant neoplasm of rectum: Secondary | ICD-10-CM | POA: Diagnosis not present

## 2019-06-21 DIAGNOSIS — H25813 Combined forms of age-related cataract, bilateral: Secondary | ICD-10-CM | POA: Diagnosis not present

## 2019-06-21 DIAGNOSIS — H04123 Dry eye syndrome of bilateral lacrimal glands: Secondary | ICD-10-CM | POA: Diagnosis not present

## 2019-09-14 DIAGNOSIS — Z0000 Encounter for general adult medical examination without abnormal findings: Secondary | ICD-10-CM | POA: Diagnosis not present

## 2019-09-14 DIAGNOSIS — Z125 Encounter for screening for malignant neoplasm of prostate: Secondary | ICD-10-CM | POA: Diagnosis not present

## 2019-09-14 DIAGNOSIS — R7989 Other specified abnormal findings of blood chemistry: Secondary | ICD-10-CM | POA: Diagnosis not present

## 2019-09-14 DIAGNOSIS — E669 Obesity, unspecified: Secondary | ICD-10-CM | POA: Diagnosis not present

## 2019-09-17 DIAGNOSIS — R82998 Other abnormal findings in urine: Secondary | ICD-10-CM | POA: Diagnosis not present

## 2019-09-21 DIAGNOSIS — M5416 Radiculopathy, lumbar region: Secondary | ICD-10-CM | POA: Diagnosis not present

## 2019-09-21 DIAGNOSIS — E781 Pure hyperglyceridemia: Secondary | ICD-10-CM | POA: Diagnosis not present

## 2019-09-21 DIAGNOSIS — E669 Obesity, unspecified: Secondary | ICD-10-CM | POA: Diagnosis not present

## 2019-09-21 DIAGNOSIS — R7301 Impaired fasting glucose: Secondary | ICD-10-CM | POA: Diagnosis not present

## 2019-09-21 DIAGNOSIS — Z86010 Personal history of colonic polyps: Secondary | ICD-10-CM | POA: Diagnosis not present

## 2019-09-21 DIAGNOSIS — Z1339 Encounter for screening examination for other mental health and behavioral disorders: Secondary | ICD-10-CM | POA: Diagnosis not present

## 2019-09-21 DIAGNOSIS — G609 Hereditary and idiopathic neuropathy, unspecified: Secondary | ICD-10-CM | POA: Diagnosis not present

## 2019-09-21 DIAGNOSIS — Z0000 Encounter for general adult medical examination without abnormal findings: Secondary | ICD-10-CM | POA: Diagnosis not present

## 2019-09-21 DIAGNOSIS — Z1331 Encounter for screening for depression: Secondary | ICD-10-CM | POA: Diagnosis not present

## 2019-09-21 DIAGNOSIS — J449 Chronic obstructive pulmonary disease, unspecified: Secondary | ICD-10-CM | POA: Diagnosis not present

## 2019-09-27 ENCOUNTER — Ambulatory Visit: Payer: BC Managed Care – PPO

## 2019-09-28 ENCOUNTER — Other Ambulatory Visit: Payer: Self-pay | Admitting: Internal Medicine

## 2019-09-28 DIAGNOSIS — M5416 Radiculopathy, lumbar region: Secondary | ICD-10-CM

## 2019-10-02 ENCOUNTER — Ambulatory Visit: Payer: BC Managed Care – PPO

## 2019-10-04 ENCOUNTER — Ambulatory Visit: Payer: Medicare Other | Attending: Internal Medicine

## 2019-10-04 DIAGNOSIS — Z23 Encounter for immunization: Secondary | ICD-10-CM | POA: Insufficient documentation

## 2019-10-04 NOTE — Progress Notes (Signed)
   Covid-19 Vaccination Clinic  Name:  Jeffrey [name]    MRN: [id number] DOB: [birth year]  10/04/2019  [name] was observed post Covid-19 immunization for 15 minutes without incidence. He was provided with Vaccine Information Sheet and instruction to access the V-Safe system.   [name] was instructed to call 911 with any severe reactions post vaccine: Marland Kitchen Difficulty breathing  . Swelling of your face and throat  . A fast heartbeat  . A bad rash all over your body  . Dizziness and weakness    Immunizations Administered    Name Date Dose VIS Date Route   Pfizer COVID-19 Vaccine 10/04/2019  8:36 AM 0.3 mL 08/10/2019 Intramuscular   Manufacturer: [place]   Lot: [id number]   [place]: [id number]

## 2019-10-09 ENCOUNTER — Ambulatory Visit
Admission: RE | Admit: 2019-10-09 | Discharge: 2019-10-09 | Disposition: A | Discharge status: Home / Self Care | Payer: Medicare Other | Source: Ambulatory Visit | Attending: Internal Medicine | Admitting: Internal Medicine

## 2019-10-09 ENCOUNTER — Other Ambulatory Visit: Payer: Self-pay

## 2019-10-09 DIAGNOSIS — M48061 Spinal stenosis, lumbar region without neurogenic claudication: Secondary | ICD-10-CM | POA: Diagnosis not present

## 2019-10-09 DIAGNOSIS — M5416 Radiculopathy, lumbar region: Secondary | ICD-10-CM

## 2019-10-15 DIAGNOSIS — M48061 Spinal stenosis, lumbar region without neurogenic claudication: Secondary | ICD-10-CM | POA: Diagnosis not present

## 2019-10-15 DIAGNOSIS — M5126 Other intervertebral disc displacement, lumbar region: Secondary | ICD-10-CM | POA: Diagnosis not present

## 2019-10-15 DIAGNOSIS — M5416 Radiculopathy, lumbar region: Secondary | ICD-10-CM | POA: Diagnosis not present

## 2019-10-15 DIAGNOSIS — M545 Low back pain: Secondary | ICD-10-CM | POA: Diagnosis not present

## 2019-10-15 DIAGNOSIS — R030 Elevated blood-pressure reading, without diagnosis of hypertension: Secondary | ICD-10-CM | POA: Diagnosis not present

## 2019-10-15 DIAGNOSIS — M4316 Spondylolisthesis, lumbar region: Secondary | ICD-10-CM | POA: Diagnosis not present

## 2019-10-15 DIAGNOSIS — Z6838 Body mass index (BMI) 38.0-38.9, adult: Secondary | ICD-10-CM | POA: Diagnosis not present

## 2019-10-16 ENCOUNTER — Other Ambulatory Visit: Payer: Self-pay | Admitting: Neurosurgery

## 2019-10-29 ENCOUNTER — Ambulatory Visit: Payer: Medicare Other | Attending: Internal Medicine

## 2019-10-29 DIAGNOSIS — Z23 Encounter for immunization: Secondary | ICD-10-CM | POA: Insufficient documentation

## 2019-10-29 NOTE — Progress Notes (Signed)
   Covid-19 Vaccination Clinic  Name:  Jeffrey [name]    MRN: [id number] DOB: 06/09/1946  10/29/2019  [name] was observed post Covid-19 immunization for 15 minutes without incidence. He was provided with Vaccine Information Sheet and instruction to access the V-Safe system.   [name] was instructed to call 911 with any severe reactions post vaccine: Marland Kitchen Difficulty breathing  . Swelling of your face and throat  . A fast heartbeat  . A bad rash all over your body  . Dizziness and weakness    Immunizations Administered    Name Date Dose VIS Date Route   Pfizer COVID-19 Vaccine 10/29/2019  1:04 PM 0.3 mL 08/10/2019 Intramuscular   Manufacturer: [place]   Lot: [id number]   [place]: [id number]

## 2019-11-05 NOTE — H&P (Signed)
Patient ID:   [id number] Patient: Jeffrey [name]  Date of Birth: 05-Jul-1946 Visit Type: Office Visit   Date: 10/15/2019 03:30 PM Provider: Marchia Meiers. Vertell Limber MD   This 74 year old male presents for back pain.  HISTORY OF PRESENT ILLNESS:  1.  back pain  Jeffrey [name], 74 year old retired male, returns for evaluation.  He was last seen for neck surgery in 2001.  Patient reports increasing low back and right leg pain over the last 6-10 years.  He was followed by Dr. Maxie Better and Dr. Nelva Bush.  ESIs offered no relief Chiropractic manipulation offered no relief  Gabapentin 300 mg t.i.d. Helps feet  History:  Healthy Surgical history:  C-spine fracture 1967, ACDF 2001 by Dr. Vertell Limber, shoulder surgery 1998, back surgery 2008, lung surgery 2009 for pneumonia, rotator cuff repair 2017  Lumbar MRI 10/09/2019 on canopy  Lumbar radiographs demonstrate spondylolisthesis of L4 on L5 of 9.5 mm on neutral, 9.5 mm on flexion, 8.5 mm on extension.  Lumbar MRI shows significant spinal stenosis at the L4-5 level with right L4 nerve root compression from disc herniation  The patient has pain at 7 to 10/10 although currently is 3/10 in severity.  The patient is doing very well following his neck surgery and has no complaints related to his cervical spine          PAST MEDICAL/SURGICAL HISTORY:   (Detailed)    Disease/disorder Onset Date Management Date Comments    Surgery, cervical spine      Surgery, lumbar spine      Rotator cuff surgery, 10/2015      Shoulder impingement       PAST MEDICAL HISTORY, SURGICAL HISTORY, FAMILY HISTORY, SOCIAL HISTORY AND REVIEW OF SYSTEMS I have reviewed the patient's past medical, surgical, family and social history as well as the comprehensive review of systems as included on the [facility] history form dated 10/01/2019, which I have signed.  Family History:  (Detailed)   Social History:  (Detailed) Tobacco use reviewed. Preferred  language is [place].   Tobacco use status: Current non-smoker. Smoking status: Never smoker.  SMOKING STATUS Type Smoking Status Usage Per Day Years Used Total Pack Years   Never smoker          MEDICATIONS: (added, continued or stopped this visit) Started Medication Directions Instruction Stopped   gabapentin 300 mg capsule take 1 capsule by oral route 3 times every day     multivitamin tablet take 1 tablet by oral route  every day     probiotic      Vitamin C oral powder        ALLERGIES: Ingredient Reaction Medication Name Comment  NO KNOWN ALLERGIES     No known allergies. Reviewed, no changes.   REVIEW OF SYSTEMS   See scanned patient registration form, dated 10/01/2019, signed and dated on 10/15/2019  Review of Systems Details System Neg/Pos Details  Constitutional Negative Chills, Fatigue, Fever, Malaise, Night sweats, Weight gain and Weight loss.  ENMT Negative Ear drainage, Hearing loss, Nasal drainage, Otalgia, Sinus pressure and Sore throat.  Eyes Negative Eye discharge, Eye pain and Vision changes.  Respiratory Negative Chronic cough, Cough, Dyspnea, Known TB exposure and Wheezing.  Cardio Negative Chest pain, Claudication, Edema and Irregular heartbeat/palpitations.  GI Negative Abdominal pain, Blood in stool, Change in stool pattern, Constipation, Decreased appetite, Diarrhea, Heartburn, Nausea and Vomiting.  GU Negative Dribbling, Dysuria, Erectile dysfunction, Hematuria, Polyuria (Genitourinary), Slow stream, Urinary frequency, Urinary incontinence and Urinary  retention.  Endocrine Negative Cold intolerance, Heat intolerance, Polydipsia and Polyphagia.  Neuro Negative Dizziness, Extremity weakness, Gait disturbance, Headache, Memory impairment, Numbness in extremity, Seizures and Tremors.  Psych Negative Anxiety, Depression and Insomnia.  Integumentary Negative Brittle hair, Brittle nails, Change in shape/size of mole(s), Hair loss, Hirsutism, Hives,  Pruritus, Rash and Skin lesion.  MS Positive Back pain.  Hema/Lymph Negative Easy bleeding, Easy bruising and Lymphadenopathy.  Allergic/Immuno Negative Contact allergy, Environmental allergies, Food allergies and Seasonal allergies.  Reproductive Negative Penile discharge and Sexual dysfunction.   PHYSICAL EXAM:   Vitals Date Temp F BP Pulse Ht In Wt Lb BMI BSA Pain Score  10/15/2019 97.6 145/77 71 71 276.8 38.61  3/10    PHYSICAL EXAM Details General Level of Distress: no acute distress Overall Appearance: normal  Head and Face  Right Left  Fundoscopic Exam:  normal normal    Cardiovascular Cardiac: regular rate and rhythm without murmur  Right Left  Carotid Pulses: normal normal  Respiratory Lungs: clear to auscultation  Neurological Orientation: normal Recent and Remote Memory: normal Attention Span and Concentration:   normal Language: normal Fund of Knowledge: normal  Right Left Sensation: normal normal Upper Extremity Coordination: normal normal  Lower Extremity Coordination: normal normal  Musculoskeletal Gait and Station: normal  Right Left Upper Extremity Muscle Strength: normal normal Lower Extremity Muscle Strength: normal normal Upper Extremity Muscle Tone:  normal normal Lower Extremity Muscle Tone: normal normal   Motor Strength Upper and lower extremity motor strength was tested in the clinically pertinent muscles.     Deep Tendon Reflexes  Right Left Biceps: normal normal Triceps: normal normal Brachioradialis: normal normal Patellar: normal normal Achilles: normal normal  Sensory Sensation was tested at L1 to S1. Any abnormal findings will be noted below.  Right Left L4: decreased    Cranial Nerves II. Optic Nerve/Visual Fields: normal III. Oculomotor: normal IV. Trochlear: normal V. Trigeminal: normal VI. Abducens: normal VII. Facial: normal VIII. Acoustic/Vestibular: normal IX. Glossopharyngeal: normal X.  Vagus: normal XI. Spinal Accessory: normal XII. Hypoglossal: normal  Motor and other Tests Lhermittes: negative Rhomberg: negative Pronator drift: absent     Right Left Hoffman's: normal normal Clonus: normal normal Babinski: normal normal SLR: positive at 40 degrees negative Patrick's Corky Sox): negative negative Toe Walk: normal normal Toe Lift: normal normal Heel Walk: normal normal SI Joint: nontender nontender   Additional Findings:  Decreased squat on the right leg    IMPRESSION:   Patient has a right L4 radiculopathy with decreased pin sensation right L4 distribution and with right leg pain.  He has a mobile spondylolisthesis of L4 on L5 and has right L4 nerve root compression due to a disc herniation.  The patient is morbidly obese  PLAN:  Given the patient's significant pain and imaging findings along with right leg symptoms, I have recommended proceeding with decompression and fusion at the L4-5 level by TLIF at [facility].  Risks and benefits were discussed in detail with patient and he wished to proceed with surgery.  Orders: Diagnostic Procedures: Assessment Procedure  M54.16 Lumbar Spine- AP/Lat  M54.16 Lumbar Spine- AP/Lat/Flex/Ex  Instruction(s)/Education: Assessment Instruction  R03.0 Lifestyle education  [phone] Dietary management education, guidance, and counseling  Miscellaneous: Assessment   M48.061 LSO Brace   Completed Orders (this encounter) Order Details Reason Side Interpretation Result Initial Treatment Date Region  Lifestyle education Patient will monitor and contact primary care physician if needed.        Dietary management education, guidance, and  counseling Encouraged patient to eat well balanced diet.        Lumbar Spine- AP/Lat/Flex/Ex      10/15/2019    Assessment/Plan   # Detail Type Description   1. Assessment Disc displacement, lumbar (M51.26).       2. Assessment Low back pain, unspecified back pain laterality, with  sciatica presence unspecified (M54.5).       3. Assessment Spondylolisthesis, lumbar region (M43.16).       4. Assessment Lumbar radiculopathy (M54.16).       5. Assessment Degenerative lumbar spinal stenosis (M48.061).   Plan Orders LSO Brace.       6. Assessment Elevated blood-pressure reading, w/o diagnosis of htn (R03.0).       7. Assessment Body mass index (BMI) 38.0-38.9, adult [id number]).   Plan Orders Today's instructions / counseling include(s) Dietary management education, guidance, and counseling. Clinical information/comments: Encouraged patient to eat well balanced diet.         Pain Management Plan Pain Scale: 3/10. Method: Numeric Pain Intensity Scale. Location: back. Onset: 10/15/2019. Duration: varies. Quality: discomforting. Pain management follow-up plan of care: Patient will continue medication management..              Provider:  Marchia Meiers. Vertell Limber MD  10/19/2019 04:10 PM    Dictation edited by: Marchia Meiers. Vertell Limber    CC Providers: Sable Feil [facility] [address] [place],  [place]  [address]   Sable Feil  [facility] [address] [place], [place] [address]               Electronically signed by Marchia Meiers. Vertell Limber MD on 10/19/2019 04:10 PM

## 2019-12-03 ENCOUNTER — Encounter (HOSPITAL_COMMUNITY): Payer: Self-pay

## 2019-12-03 NOTE — H&P (Signed)
Patient ID:   [id number] Patient: Jeffrey [name]  Date of Birth: 1945/12/16 Visit Type: Office Visit   Date: 10/15/2019 03:30 PM Provider: Marchia Meiers. Vertell Limber MD   This 74 year old male presents for back pain.  HISTORY OF PRESENT ILLNESS: 1.  back pain  Jeffrey [name], 74 year old retired male, returns for evaluation.  He was last seen for neck surgery in 2001.  Patient reports increasing low back and right leg pain over the last 6-10 years.  He was followed by Dr. Maxie Better and Dr. Nelva Bush.  ESIs offered no relief Chiropractic manipulation offered no relief  Gabapentin 300 mg t.i.d. Helps feet  History:  Healthy Surgical history:  C-spine fracture 1967, ACDF 2001 by Dr. Vertell Limber, shoulder surgery 1998, back surgery 2008, lung surgery 2009 for pneumonia, rotator cuff repair 2017  Lumbar MRI 10/09/2019 on canopy  Lumbar radiographs demonstrate spondylolisthesis of L4 on L5 of 9.5 mm on neutral, 9.5 mm on flexion, 8.5 mm on extension.  Lumbar MRI shows significant spinal stenosis at the L4-5 level with right L4 nerve root compression from disc herniation  The patient has pain at 7 to 10/10 although currently is 3/10 in severity.  The patient is doing very well following his neck surgery and has no complaints related to his cervical spine       PAST MEDICAL/SURGICAL HISTORY:   (Detailed)   Disease/disorder Onset Date Management Date Comments   Surgery, cervical spine     Surgery, lumbar spine     Rotator cuff surgery, 10/2015     Shoulder impingement      PAST MEDICAL HISTORY, SURGICAL HISTORY, FAMILY HISTORY, SOCIAL HISTORY AND REVIEW OF SYSTEMS I have reviewed the patient's past medical, surgical, family and social history as well as the comprehensive review of systems as included on the [facility] history form dated 10/01/2019, which I have signed.  Family History:  (Detailed)   Social History:  (Detailed) Tobacco use  reviewed. Preferred language is [place].   Tobacco use status: Current non-smoker. Smoking status: Never smoker.  SMOKING STATUS Type Smoking Status Usage Per Day Years Used Total Pack Years  Never smoker         MEDICATIONS: (added, continued or stopped this visit) Started Medication Directions Instruction Stopped  gabapentin 300 mg capsule take 1 capsule by oral route 3 times every day    multivitamin tablet take 1 tablet by oral route  every day    probiotic     Vitamin C oral powder       ALLERGIES: Ingredient Reaction Medication Name Comment NO KNOWN ALLERGIES    No known allergies. Reviewed, no changes.   REVIEW OF SYSTEMS  See scanned patient registration form, dated 10/01/2019, signed and dated on 10/15/2019  Review of Systems Details System Neg/Pos Details Constitutional Negative Chills, Fatigue, Fever, Malaise, Night sweats, Weight gain and Weight loss. ENMT Negative Ear drainage, Hearing loss, Nasal drainage, Otalgia, Sinus pressure and Sore throat. Eyes Negative Eye discharge, Eye pain and Vision changes. Respiratory Negative Chronic cough, Cough, Dyspnea, Known TB exposure and Wheezing. Cardio Negative Chest pain, Claudication, Edema and Irregular heartbeat/palpitations. GI Negative Abdominal pain, Blood in stool, Change in stool pattern, Constipation, Decreased appetite, Diarrhea, Heartburn, Nausea and Vomiting. GU Negative Dribbling, Dysuria, Erectile dysfunction, Hematuria, Polyuria (Genitourinary), Slow stream, Urinary frequency, Urinary incontinence and Urinary retention. Endocrine Negative Cold intolerance, Heat intolerance, Polydipsia and Polyphagia. Neuro Negative Dizziness, Extremity weakness, Gait disturbance, Headache, Memory impairment, Numbness in extremity, Seizures and Tremors. Psych  Negative Anxiety, Depression and Insomnia. Integumentary Negative Brittle hair, Brittle nails, Change in  shape/size of mole(s), Hair loss, Hirsutism, Hives, Pruritus, Rash and Skin lesion. MS Positive Back pain. Hema/Lymph Negative Easy bleeding, Easy bruising and Lymphadenopathy. Allergic/Immuno Negative Contact allergy, Environmental allergies, Food allergies and Seasonal allergies. Reproductive Negative Penile discharge and Sexual dysfunction.  PHYSICAL EXAM:  Vitals Date Temp F BP Pulse Ht In Wt Lb BMI BSA Pain Score 10/15/2019 97.6 145/77 71 71 276.8 38.61  3/10   PHYSICAL EXAM Details General Level of Distress: no acute distress Overall Appearance: normal  Head and Face  Right Left  Fundoscopic Exam:  normal normal    Cardiovascular Cardiac: regular rate and rhythm without murmur  Right Left  Carotid Pulses: normal normal  Respiratory Lungs: clear to auscultation  Neurological Orientation: normal Recent and Remote Memory: normal Attention Span and Concentration:   normal Language: normal Fund of Knowledge: normal  Right Left Sensation: normal normal Upper Extremity Coordination: normal normal  Lower Extremity Coordination: normal normal  Musculoskeletal Gait and Station: normal  Right Left Upper Extremity Muscle Strength: normal normal Lower Extremity Muscle Strength: normal normal Upper Extremity Muscle Tone:  normal normal Lower Extremity Muscle Tone: normal normal   Motor Strength Upper and lower extremity motor strength was tested in the clinically pertinent muscles.     Deep Tendon Reflexes  Right Left Biceps: normal normal Triceps: normal normal Brachioradialis: normal normal Patellar: normal normal Achilles: normal normal  Sensory Sensation was tested at L1 to S1. Any abnormal findings will be noted below.  Right Left L4: decreased    Cranial Nerves II. Optic Nerve/Visual Fields: normal III. Oculomotor: normal IV. Trochlear: normal V. Trigeminal: normal VI. Abducens: normal VII. Facial: normal VIII.  Acoustic/Vestibular: normal IX. Glossopharyngeal: normal X. Vagus: normal XI. Spinal Accessory: normal XII. Hypoglossal: normal  Motor and other Tests Lhermittes: negative Rhomberg: negative Pronator drift: absent     Right Left Hoffman's: normal normal Clonus: normal normal Babinski: normal normal SLR: positive at 40 degrees negative Patrick's Corky Sox): negative negative Toe Walk: normal normal Toe Lift: normal normal Heel Walk: normal normal SI Joint: nontender nontender   Additional Findings:  Decreased squat on the right leg    IMPRESSION:  Patient has a right L4 radiculopathy with decreased pin sensation right L4 distribution and with right leg pain.  He has a mobile spondylolisthesis of L4 on L5 and has right L4 nerve root compression due to a disc herniation.  The patient is morbidly obese  PLAN: Given the patient's significant pain and imaging findings along with right leg symptoms, I have recommended proceeding with decompression and fusion at the L4-5 level by TLIF at [facility].  Risks and benefits were discussed in detail with patient and he wished to proceed with surgery.  Orders: Diagnostic Procedures: Assessment Procedure M54.16 Lumbar Spine- AP/Lat M54.16 Lumbar Spine- AP/Lat/Flex/Ex Instruction(s)/Education: Assessment Instruction R03.0 Lifestyle education [id number] Dietary management education, guidance, and counseling Miscellaneous: Assessment  M48.061 LSO Brace  Completed Orders (this encounter) Order Details Reason Side Interpretation Result Initial Treatment Date Region Lifestyle education Patient will monitor and contact primary care physician if needed.       Dietary management education, guidance, and counseling Encouraged patient to eat well balanced diet.       Lumbar Spine- AP/Lat/Flex/Ex      10/15/2019   Assessment/Plan  # Detail Type Description  1. Assessment Disc displacement, lumbar  (M51.26).     2. Assessment Low back pain, unspecified back pain  laterality, with sciatica presence unspecified (M54.5).     3. Assessment Spondylolisthesis, lumbar region (M43.16).     4. Assessment Lumbar radiculopathy (M54.16).     5. Assessment Degenerative lumbar spinal stenosis (M48.061).  Plan Orders LSO Brace.     6. Assessment Elevated blood-pressure reading, w/o diagnosis of htn (R03.0).     7. Assessment Body mass index (BMI) 38.0-38.9, adult HF:[id number]).  Plan Orders Today's instructions / counseling include(s) Dietary management education, guidance, and counseling. Clinical information/comments: Encouraged patient to eat well balanced diet.       Pain Management Plan Pain Scale: 3/10. Method: Numeric Pain Intensity Scale. Location: back. Onset: 10/15/2019. Duration: varies. Quality: discomforting. Pain management follow-up plan of care: Patient will continue medication management..              Provider:  Marchia Meiers. Vertell Limber MD  10/19/2019 04:10 PM    Dictation edited by: Marchia Meiers. Vertell Limber    CC Providers: Sable Feil [facility] [address] [place],  [place]  [phone]   Sable Feil  [facility] [address] [place], [place] [address]               Electronically signed by Marchia Meiers. Vertell Limber MD on 10/19/2019 04:10 PM

## 2019-12-03 NOTE — Progress Notes (Signed)
Jeffrey [name]  Your procedure is scheduled on Friday April 9.  Report to [facility] Main Entrance "A" at 05:30 A.M., and check in at the Admitting office.  Call this number if you have problems the morning of surgery: [phone]  Call [phone] if you have any questions prior to your surgery date Monday-Friday 8am-4pm   Remember: Do not eat or drink after midnight the night before your surgery   Take these medicines the morning of surgery with A SIP OF WATER: gabapentin (NEURONTIN)   As of today, STOP taking any Aspirin (unless otherwise instructed by your surgeon), Aleve, Naproxen, Ibuprofen, Motrin, Advil, Goody's, BC's, all herbal medications, fish oil, and all vitamins (including: B complex, Vitamin D3, multivitamin, and probiotics)    The Morning of Surgery  Do not wear jewelry  Do not wear lotions, powders, colognes, or deodorant Men may shave face and neck.  Do not bring valuables to the hospital.  [facility] is not responsible for any belongings or valuables.  If you are a smoker, DO NOT Smoke 24 hours prior to surgery  If you wear a CPAP at night please bring your mask the morning of surgery   Remember that you must have someone to transport you home after your surgery, and remain with you for 24 hours if you are discharged the same day.   Please bring cases for contacts, glasses, hearing aids, dentures or bridgework because it cannot be worn into surgery.    Leave your suitcase in the car.  After surgery it may be brought to your room.  For patients admitted to the hospital, discharge time will be determined by your treatment team.  Patients discharged the day of surgery will not be allowed to drive home.    Special instructions:   [facility]- Preparing For Surgery  Before surgery, you can play an important role. Because skin is not sterile, your skin needs to be as free of germs as possible. You can reduce the number of germs on your skin by washing  with CHG (chlorahexidine gluconate) Soap before surgery.  CHG is an antiseptic cleaner which kills germs and bonds with the skin to continue killing germs even after washing.    Oral Hygiene is also important to reduce your risk of infection.  Remember - BRUSH YOUR TEETH THE MORNING OF SURGERY WITH YOUR REGULAR TOOTHPASTE  Please do not use if you have an allergy to CHG or antibacterial soaps. If your skin becomes reddened/irritated stop using the CHG.  Do not shave (including legs and underarms) for at least 48 hours prior to first CHG shower. It is OK to shave your face.  Please follow these instructions carefully.   1. Shower the NIGHT BEFORE SURGERY and the MORNING OF SURGERY with CHG Soap.   2. If you chose to wash your hair and body, wash as usual with your normal shampoo and body-wash/soap.  3. Rinse your hair and body thoroughly to remove the shampoo and soap.  4. Apply CHG directly to the skin (ONLY FROM THE NECK DOWN) and wash gently with a scrungie or a clean washcloth.   5. Do not use on open wounds or open sores. Avoid contact with your eyes, ears, mouth and genitals (private parts). Wash Face and genitals (private parts)  with your normal soap.   6. Wash thoroughly, paying special attention to the area where your surgery will be performed.  7. Thoroughly rinse your body with warm water from the neck down.  8. DO NOT shower/wash with your normal soap after using and rinsing off the CHG Soap.  9. Pat yourself dry with a CLEAN TOWEL.  10. Wear CLEAN PAJAMAS to bed the night before surgery  11. Place CLEAN SHEETS on your bed the night of your first shower and DO NOT SLEEP WITH PETS.  12. Wear comfortable clothes the morning of surgery.     Day of Surgery:  Please shower the morning of surgery with the CHG soap Do not apply any deodorants/lotions. Please wear clean clothes to the hospital/surgery center.   Remember to brush your teeth WITH YOUR REGULAR  TOOTHPASTE.   Please read over the following fact sheets that you were given.

## 2019-12-04 ENCOUNTER — Other Ambulatory Visit (HOSPITAL_COMMUNITY)
Admission: RE | Admit: 2019-12-04 | Discharge: 2019-12-04 | Disposition: A | Discharge status: Home / Self Care | Payer: Medicare Other | Source: Ambulatory Visit | Attending: Neurosurgery | Admitting: Neurosurgery

## 2019-12-04 ENCOUNTER — Encounter (HOSPITAL_COMMUNITY)
Admission: RE | Admit: 2019-12-04 | Discharge: 2019-12-04 | Disposition: A | Discharge status: Home / Self Care | Payer: Medicare Other | Source: Ambulatory Visit | Attending: Neurosurgery | Admitting: Neurosurgery

## 2019-12-04 ENCOUNTER — Other Ambulatory Visit: Payer: Self-pay

## 2019-12-04 ENCOUNTER — Encounter (HOSPITAL_COMMUNITY): Payer: Self-pay

## 2019-12-04 DIAGNOSIS — Z20822 Contact with and (suspected) exposure to covid-19: Secondary | ICD-10-CM | POA: Insufficient documentation

## 2019-12-04 DIAGNOSIS — Z01812 Encounter for preprocedural laboratory examination: Secondary | ICD-10-CM | POA: Insufficient documentation

## 2019-12-04 LAB — SURGICAL PCR SCREEN
MRSA, PCR: NEGATIVE
Staphylococcus aureus: NEGATIVE

## 2019-12-04 LAB — CBC
HCT: 41.8 % (ref 39.0–52.0)
Hemoglobin: 13.6 g/dL (ref 13.0–17.0)
MCH: 31 pg (ref 26.0–34.0)
MCHC: 32.5 g/dL (ref 30.0–36.0)
MCV: 95.2 fL (ref 80.0–100.0)
Platelets: 266 10*3/uL (ref 150–400)
RBC: 4.39 MIL/uL (ref 4.22–5.81)
RDW: 13.2 % (ref 11.5–15.5)
WBC: 7.2 10*3/uL (ref 4.0–10.5)
nRBC: 0 % (ref 0.0–0.2)

## 2019-12-04 LAB — TYPE AND SCREEN
ABO/RH(D): A NEG
Antibody Screen: NEGATIVE

## 2019-12-04 LAB — SARS CORONAVIRUS 2 (TAT 6-24 HRS): SARS Coronavirus 2: NEGATIVE

## 2019-12-04 NOTE — Progress Notes (Signed)
[id number] [address], [place] [address] Phone: [phone] Fax: [phone]      Your procedure is scheduled on Friday December 07, 2019.  Report to [facility] Main Entrance "A" at 05:30 A.M., and check in at the Admitting office.  Call this number if you have problems the morning of surgery:  [phone]  Call [phone] if you have any questions prior to your surgery date Monday-Friday 8am-4pm    Remember:  Do not eat or drink after midnight the night before your surgery   Take these medicines the morning of surgery with A SIP OF WATER: Gabapentin (Neurontin)  As of today, STOP taking any Aspirin (unless otherwise instructed by your surgeon) and Aspirin containing products, Aleve, Naproxen, Ibuprofen, Motrin, Advil, Goody's, BC's, all herbal medications, fish oil, and all vitamins.                      Do not wear jewelry            Do not wear lotions, powders, perfumes/colognes, or deodorant.            Do not shave 48 hours prior to surgery.  Men may shave face and neck.            Do not bring valuables to the hospital.            [facility] is not responsible for any belongings or valuables.  Do NOT Smoke (Tobacco/Vapping) or drink Alcohol 24 hours prior to your procedure  If you use a CPAP at night, you may bring all equipment for your overnight stay.   Contacts, glasses, dentures or bridgework may not be worn into surgery.      For patients admitted to the hospital, discharge time will be determined by your treatment team.   Patients discharged the day of surgery will not be allowed to drive home, and someone needs to stay with them for 24 hours.    Special instructions:   [facility]- Preparing For Surgery  Before surgery, you can play an important role. Because skin is not sterile, your skin needs to be as free of germs as possible. You can reduce the number of germs on your skin by washing  with CHG (chlorahexidine gluconate) Soap before surgery.  CHG is an antiseptic cleaner which kills germs and bonds with the skin to continue killing germs even after washing.    Oral Hygiene is also important to reduce your risk of infection.  Remember - BRUSH YOUR TEETH THE MORNING OF SURGERY WITH YOUR REGULAR TOOTHPASTE  Please do not use if you have an allergy to CHG or antibacterial soaps. If your skin becomes reddened/irritated stop using the CHG.  Do not shave (including legs and underarms) for at least 48 hours prior to first CHG shower. It is OK to shave your face.  Please follow these instructions carefully.   1. Shower the NIGHT BEFORE SURGERY and the MORNING OF SURGERY with CHG Soap.   2. If you chose to wash your hair, wash your hair first as usual with your normal shampoo.  3. After you shampoo, rinse your hair and body thoroughly to remove the shampoo.  4. Use CHG as you would any other liquid soap. You can apply CHG directly to the skin and wash gently with a scrungie or a clean washcloth.   5. Apply the CHG Soap to your body ONLY FROM THE  NECK DOWN.  Do not use on open wounds or open sores. Avoid contact with your eyes, ears, mouth and genitals (private parts). Wash Face and genitals (private parts)  with your normal soap.   6. Wash thoroughly, paying special attention to the area where your surgery will be performed.  7. Thoroughly rinse your body with warm water from the neck down.  8. DO NOT shower/wash with your normal soap after using and rinsing off the CHG Soap.  9. Pat yourself dry with a CLEAN TOWEL.  10. Wear CLEAN PAJAMAS to bed the night before surgery, wear comfortable clothes the morning of surgery  11. Place CLEAN SHEETS on your bed the night of your first shower and DO NOT SLEEP WITH PETS.   Day of Surgery: Shower as stated above. Do not apply any deodorants/lotions.  Please wear clean clothes to the hospital/surgery center.   Remember to brush your  teeth WITH YOUR REGULAR TOOTHPASTE.   Please read over the following fact sheets that you were given.

## 2019-12-04 NOTE — Progress Notes (Signed)
PCP - Dr. Brigitte Pulse Cardiologist - Patient denies  PPM/ICD - n/a Device Orders -  Rep Notified -   Chest x-ray - n/a EKG - n/a, patient denies Stress Test - patient denies ECHO - patient denies Cardiac Cath - patient denies  Sleep Study - patient denies CPAP -   Fasting Blood Sugar - n/a Checks Blood Sugar _____ times a day  Blood Thinner Instructions: n/a Aspirin Instructions:  ERAS Protcol - n/a PRE-SURGERY Ensure or G2-   COVID TEST- scheduled 12/04/19 after PAT appointment   Anesthesia review: n/a  Patient denies shortness of breath, fever, cough and chest pain at PAT appointment   All instructions explained to the patient, with a verbal understanding of the material. Patient agrees to go over the instructions while at home for a better understanding. Patient also instructed to self quarantine after being tested for COVID-19. The opportunity to ask questions was provided.

## 2019-12-06 MED ORDER — DEXTROSE 5 % IV SOLN
3.0000 g | INTRAVENOUS | Status: AC
  Administered 2019-12-07: 3 g via INTRAVENOUS
  Filled 2019-12-06 (×2): qty 3000

## 2019-12-07 ENCOUNTER — Ambulatory Visit (HOSPITAL_COMMUNITY)
Admission: RE | Disposition: A | Discharge status: Home / Self Care | Payer: Self-pay | Source: Home / Self Care | Attending: Neurosurgery

## 2019-12-07 ENCOUNTER — Observation Stay (HOSPITAL_COMMUNITY)
Admission: RE | Admit: 2019-12-07 | Discharge: 2019-12-08 | Disposition: A | Discharge status: Home / Self Care | Payer: Medicare Other | Attending: Neurosurgery | Admitting: Neurosurgery

## 2019-12-07 ENCOUNTER — Other Ambulatory Visit: Payer: Self-pay

## 2019-12-07 ENCOUNTER — Encounter (HOSPITAL_COMMUNITY): Payer: Self-pay | Admitting: Neurosurgery

## 2019-12-07 ENCOUNTER — Inpatient Hospital Stay (HOSPITAL_COMMUNITY): Payer: Medicare Other | Admitting: Anesthesiology

## 2019-12-07 ENCOUNTER — Inpatient Hospital Stay (HOSPITAL_COMMUNITY): Payer: Medicare Other | Admitting: Physician Assistant

## 2019-12-07 ENCOUNTER — Inpatient Hospital Stay (HOSPITAL_COMMUNITY): Payer: Medicare Other

## 2019-12-07 DIAGNOSIS — Z981 Arthrodesis status: Secondary | ICD-10-CM | POA: Insufficient documentation

## 2019-12-07 DIAGNOSIS — Z6838 Body mass index (BMI) 38.0-38.9, adult: Secondary | ICD-10-CM | POA: Diagnosis not present

## 2019-12-07 DIAGNOSIS — R030 Elevated blood-pressure reading, without diagnosis of hypertension: Secondary | ICD-10-CM | POA: Insufficient documentation

## 2019-12-07 DIAGNOSIS — M5116 Intervertebral disc disorders with radiculopathy, lumbar region: Secondary | ICD-10-CM | POA: Diagnosis not present

## 2019-12-07 DIAGNOSIS — M48061 Spinal stenosis, lumbar region without neurogenic claudication: Secondary | ICD-10-CM | POA: Diagnosis not present

## 2019-12-07 DIAGNOSIS — M4316 Spondylolisthesis, lumbar region: Principal | ICD-10-CM | POA: Diagnosis present

## 2019-12-07 DIAGNOSIS — Z419 Encounter for procedure for purposes other than remedying health state, unspecified: Secondary | ICD-10-CM

## 2019-12-07 DIAGNOSIS — M4326 Fusion of spine, lumbar region: Secondary | ICD-10-CM | POA: Diagnosis not present

## 2019-12-07 HISTORY — PX: TRANSFORAMINAL LUMBAR INTERBODY FUSION (TLIF) WITH PEDICLE SCREW FIXATION 1 LEVEL: SHX6141

## 2019-12-07 SURGERY — TRANSFORAMINAL LUMBAR INTERBODY FUSION (TLIF) WITH PEDICLE SCREW FIXATION 1 LEVEL
Anesthesia: General | Site: Back

## 2019-12-07 MED ORDER — ONDANSETRON HCL 4 MG PO TABS: 4.0000 mg | ORAL_TABLET | Freq: Four times a day (QID) | ORAL | Status: DC | PRN

## 2019-12-07 MED ORDER — ALUM & MAG HYDROXIDE-SIMETH 200-200-20 MG/5ML PO SUSP: 30.0000 mL | Freq: Four times a day (QID) | ORAL | Status: DC | PRN

## 2019-12-07 MED ORDER — PANTOPRAZOLE SODIUM 40 MG PO TBEC
40.0000 mg | DELAYED_RELEASE_TABLET | Freq: Every day | ORAL | Status: DC
  Administered 2019-12-07: 21:00:00 40 mg via ORAL
  Filled 2019-12-07: qty 1

## 2019-12-07 MED ORDER — 0.9 % SODIUM CHLORIDE (POUR BTL) OPTIME
TOPICAL | Status: DC | PRN
  Administered 2019-12-07: 1000 mL

## 2019-12-07 MED ORDER — CHLORHEXIDINE GLUCONATE CLOTH 2 % EX PADS: 6.0000 | MEDICATED_PAD | Freq: Once | CUTANEOUS | Status: DC

## 2019-12-07 MED ORDER — PROPOFOL 10 MG/ML IV BOLUS
INTRAVENOUS | Status: DC | PRN
  Administered 2019-12-07: 180 mg via INTRAVENOUS

## 2019-12-07 MED ORDER — SUCCINYLCHOLINE CHLORIDE 200 MG/10ML IV SOSY
PREFILLED_SYRINGE | INTRAVENOUS | Status: AC
  Filled 2019-12-07: qty 10

## 2019-12-07 MED ORDER — METHOCARBAMOL 1000 MG/10ML IJ SOLN
500.0000 mg | Freq: Four times a day (QID) | INTRAVENOUS | Status: DC | PRN
  Filled 2019-12-07: qty 5

## 2019-12-07 MED ORDER — B COMPLEX-C PO TABS
1.0000 | ORAL_TABLET | Freq: Every day | ORAL | Status: DC
  Filled 2019-12-07: qty 1

## 2019-12-07 MED ORDER — ACETAMINOPHEN 650 MG RE SUPP
650.0000 mg | RECTAL | Status: DC | PRN
  Filled 2019-12-07: qty 1

## 2019-12-07 MED ORDER — FENTANYL CITRATE (PF) 100 MCG/2ML IJ SOLN
INTRAMUSCULAR | Status: AC
  Filled 2019-12-07: qty 2

## 2019-12-07 MED ORDER — GABAPENTIN 300 MG PO CAPS
300.0000 mg | ORAL_CAPSULE | Freq: Three times a day (TID) | ORAL | Status: DC
  Administered 2019-12-07 (×2): 300 mg via ORAL
  Filled 2019-12-07 (×2): qty 1

## 2019-12-07 MED ORDER — BUPIVACAINE HCL (PF) 0.5 % IJ SOLN
INTRAMUSCULAR | Status: DC | PRN
  Administered 2019-12-07: 5 mL

## 2019-12-07 MED ORDER — DEXAMETHASONE SODIUM PHOSPHATE 10 MG/ML IJ SOLN
INTRAMUSCULAR | Status: DC | PRN
  Administered 2019-12-07: 10 mg via INTRAVENOUS

## 2019-12-07 MED ORDER — LIDOCAINE-EPINEPHRINE 1 %-1:100000 IJ SOLN
INTRAMUSCULAR | Status: AC
  Filled 2019-12-07: qty 1

## 2019-12-07 MED ORDER — LIDOCAINE 2% (20 MG/ML) 5 ML SYRINGE
INTRAMUSCULAR | Status: DC | PRN
  Administered 2019-12-07: 40 mg via INTRAVENOUS

## 2019-12-07 MED ORDER — VANCOMYCIN HCL 1000 MG IV SOLR
INTRAVENOUS | Status: AC
  Filled 2019-12-07: qty 1000

## 2019-12-07 MED ORDER — MENTHOL 3 MG MT LOZG: 1.0000 | LOZENGE | OROMUCOSAL | Status: DC | PRN

## 2019-12-07 MED ORDER — BUPIVACAINE LIPOSOME 1.3 % IJ SUSP
20.0000 mL | INTRAMUSCULAR | Status: AC
  Administered 2019-12-07: 20 mL
  Filled 2019-12-07: qty 20

## 2019-12-07 MED ORDER — ACETAMINOPHEN 325 MG PO TABS
ORAL_TABLET | ORAL | Status: AC
  Filled 2019-12-07: qty 2

## 2019-12-07 MED ORDER — FENTANYL CITRATE (PF) 250 MCG/5ML IJ SOLN
INTRAMUSCULAR | Status: AC
  Filled 2019-12-07: qty 5

## 2019-12-07 MED ORDER — PANTOPRAZOLE SODIUM 40 MG IV SOLR: 40.0000 mg | Freq: Every day | INTRAVENOUS | Status: DC

## 2019-12-07 MED ORDER — METHOCARBAMOL 500 MG PO TABS
ORAL_TABLET | ORAL | Status: AC
  Filled 2019-12-07: qty 1

## 2019-12-07 MED ORDER — FLEET ENEMA 7-19 GM/118ML RE ENEM: 1.0000 | ENEMA | Freq: Once | RECTAL | Status: DC | PRN

## 2019-12-07 MED ORDER — THROMBIN 20000 UNITS EX SOLR
CUTANEOUS | Status: AC
  Filled 2019-12-07: qty 20000

## 2019-12-07 MED ORDER — SUGAMMADEX SODIUM 200 MG/2ML IV SOLN
INTRAVENOUS | Status: DC | PRN
  Administered 2019-12-07: 300 mg via INTRAVENOUS

## 2019-12-07 MED ORDER — BUPIVACAINE HCL (PF) 0.5 % IJ SOLN
INTRAMUSCULAR | Status: AC
  Filled 2019-12-07: qty 30

## 2019-12-07 MED ORDER — CEFAZOLIN SODIUM-DEXTROSE 2-4 GM/100ML-% IV SOLN
2.0000 g | Freq: Three times a day (TID) | INTRAVENOUS | Status: AC
  Administered 2019-12-07 – 2019-12-08 (×2): 2 g via INTRAVENOUS
  Filled 2019-12-07 (×2): qty 100

## 2019-12-07 MED ORDER — PHENYLEPHRINE 40 MCG/ML (10ML) SYRINGE FOR IV PUSH (FOR BLOOD PRESSURE SUPPORT)
PREFILLED_SYRINGE | INTRAVENOUS | Status: DC | PRN
  Administered 2019-12-07: 80 ug via INTRAVENOUS
  Administered 2019-12-07: 120 ug via INTRAVENOUS
  Administered 2019-12-07 (×2): 80 ug via INTRAVENOUS

## 2019-12-07 MED ORDER — ACETAMINOPHEN 325 MG PO TABS
650.0000 mg | ORAL_TABLET | ORAL | Status: DC | PRN
  Administered 2019-12-07 – 2019-12-08 (×3): 650 mg via ORAL
  Filled 2019-12-07 (×3): qty 2

## 2019-12-07 MED ORDER — OXYCODONE HCL 5 MG PO TABS
5.0000 mg | ORAL_TABLET | Freq: Once | ORAL | Status: AC | PRN
  Administered 2019-12-07: 5 mg via ORAL

## 2019-12-07 MED ORDER — DOCUSATE SODIUM 100 MG PO CAPS
100.0000 mg | ORAL_CAPSULE | Freq: Two times a day (BID) | ORAL | Status: DC
  Administered 2019-12-07: 21:00:00 100 mg via ORAL
  Filled 2019-12-07: qty 1

## 2019-12-07 MED ORDER — THROMBIN 5000 UNITS EX SOLR
OROMUCOSAL | Status: DC | PRN
  Administered 2019-12-07: 5 mL

## 2019-12-07 MED ORDER — PHENYLEPHRINE HCL-NACL 10-0.9 MG/250ML-% IV SOLN
INTRAVENOUS | Status: DC | PRN
  Administered 2019-12-07: 40 ug/min via INTRAVENOUS

## 2019-12-07 MED ORDER — LIDOCAINE-EPINEPHRINE 1 %-1:100000 IJ SOLN
INTRAMUSCULAR | Status: DC | PRN
  Administered 2019-12-07: 5 mL

## 2019-12-07 MED ORDER — EPHEDRINE 5 MG/ML INJ
INTRAVENOUS | Status: AC
  Filled 2019-12-07: qty 10

## 2019-12-07 MED ORDER — PROBIOTIC PO CAPS: ORAL_CAPSULE | Freq: Every day | ORAL | Status: DC

## 2019-12-07 MED ORDER — FENTANYL CITRATE (PF) 250 MCG/5ML IJ SOLN
INTRAMUSCULAR | Status: DC | PRN
  Administered 2019-12-07 (×2): 50 ug via INTRAVENOUS
  Administered 2019-12-07: 100 ug via INTRAVENOUS
  Administered 2019-12-07: 50 ug via INTRAVENOUS

## 2019-12-07 MED ORDER — OXYCODONE HCL 5 MG PO TABS
ORAL_TABLET | ORAL | Status: AC
  Filled 2019-12-07: qty 1

## 2019-12-07 MED ORDER — ROCURONIUM BROMIDE 10 MG/ML (PF) SYRINGE
PREFILLED_SYRINGE | INTRAVENOUS | Status: DC | PRN
  Administered 2019-12-07: 20 mg via INTRAVENOUS
  Administered 2019-12-07: 70 mg via INTRAVENOUS

## 2019-12-07 MED ORDER — ONDANSETRON HCL 4 MG/2ML IJ SOLN: 4.0000 mg | Freq: Once | INTRAMUSCULAR | Status: DC | PRN

## 2019-12-07 MED ORDER — HYDROCODONE-ACETAMINOPHEN 5-325 MG PO TABS: 1.0000 | ORAL_TABLET | ORAL | Status: DC | PRN

## 2019-12-07 MED ORDER — CALCIUM POLYCARBOPHIL 625 MG PO TABS
625.0000 mg | ORAL_TABLET | Freq: Every day | ORAL | Status: DC
  Filled 2019-12-07 (×2): qty 1

## 2019-12-07 MED ORDER — ADULT MULTIVITAMIN W/MINERALS CH
1.0000 | ORAL_TABLET | Freq: Every day | ORAL | Status: DC
  Filled 2019-12-07: qty 1

## 2019-12-07 MED ORDER — SODIUM CHLORIDE 0.9% FLUSH: 3.0000 mL | Freq: Two times a day (BID) | INTRAVENOUS | Status: DC

## 2019-12-07 MED ORDER — KCL IN DEXTROSE-NACL 20-5-0.45 MEQ/L-%-% IV SOLN: INTRAVENOUS | Status: DC

## 2019-12-07 MED ORDER — SODIUM CHLORIDE 0.9 % IV SOLN: 250.0000 mL | INTRAVENOUS | Status: DC

## 2019-12-07 MED ORDER — METHOCARBAMOL 500 MG PO TABS
500.0000 mg | ORAL_TABLET | Freq: Four times a day (QID) | ORAL | Status: DC | PRN
  Administered 2019-12-07 – 2019-12-08 (×3): 500 mg via ORAL
  Filled 2019-12-07 (×3): qty 1

## 2019-12-07 MED ORDER — OXYCODONE HCL 5 MG/5ML PO SOLN: 5.0000 mg | Freq: Once | ORAL | Status: AC | PRN

## 2019-12-07 MED ORDER — VITAMIN D3 25 MCG (1000 UNIT) PO TABS
1000.0000 [IU] | ORAL_TABLET | Freq: Every day | ORAL | Status: DC
  Filled 2019-12-07: qty 1

## 2019-12-07 MED ORDER — POLYETHYLENE GLYCOL 3350 17 G PO PACK: 17.0000 g | PACK | Freq: Every day | ORAL | Status: DC | PRN

## 2019-12-07 MED ORDER — THROMBIN 20000 UNITS EX KIT
PACK | CUTANEOUS | Status: AC
  Filled 2019-12-07: qty 1

## 2019-12-07 MED ORDER — CEFAZOLIN SODIUM-DEXTROSE 2-4 GM/100ML-% IV SOLN
INTRAVENOUS | Status: AC
  Filled 2019-12-07: qty 100

## 2019-12-07 MED ORDER — LACTATED RINGERS IV SOLN: INTRAVENOUS | Status: DC | PRN

## 2019-12-07 MED ORDER — SODIUM CHLORIDE 0.9% FLUSH: 3.0000 mL | INTRAVENOUS | Status: DC | PRN

## 2019-12-07 MED ORDER — DEXAMETHASONE SODIUM PHOSPHATE 10 MG/ML IJ SOLN
INTRAMUSCULAR | Status: AC
  Filled 2019-12-07: qty 1

## 2019-12-07 MED ORDER — FENTANYL CITRATE (PF) 100 MCG/2ML IJ SOLN
25.0000 ug | INTRAMUSCULAR | Status: DC | PRN
  Administered 2019-12-07 (×3): 50 ug via INTRAVENOUS

## 2019-12-07 MED ORDER — HYDROMORPHONE HCL 1 MG/ML IJ SOLN
0.5000 mg | INTRAMUSCULAR | Status: DC | PRN
  Administered 2019-12-07: 0.5 mg via INTRAVENOUS
  Filled 2019-12-07: qty 0.5

## 2019-12-07 MED ORDER — ONDANSETRON HCL 4 MG/2ML IJ SOLN: 4.0000 mg | Freq: Four times a day (QID) | INTRAMUSCULAR | Status: DC | PRN

## 2019-12-07 MED ORDER — BISACODYL 10 MG RE SUPP: 10.0000 mg | Freq: Every day | RECTAL | Status: DC | PRN

## 2019-12-07 MED ORDER — ZOLPIDEM TARTRATE 5 MG PO TABS: 5.0000 mg | ORAL_TABLET | Freq: Every evening | ORAL | Status: DC | PRN

## 2019-12-07 MED ORDER — OXYCODONE HCL 5 MG PO TABS
5.0000 mg | ORAL_TABLET | ORAL | Status: DC | PRN
  Administered 2019-12-07 – 2019-12-08 (×4): 10 mg via ORAL
  Filled 2019-12-07 (×4): qty 2

## 2019-12-07 MED ORDER — ROCURONIUM BROMIDE 10 MG/ML (PF) SYRINGE
PREFILLED_SYRINGE | INTRAVENOUS | Status: AC
  Filled 2019-12-07: qty 10

## 2019-12-07 MED ORDER — PHENOL 1.4 % MT LIQD: 1.0000 | OROMUCOSAL | Status: DC | PRN

## 2019-12-07 MED ORDER — PHENYLEPHRINE 40 MCG/ML (10ML) SYRINGE FOR IV PUSH (FOR BLOOD PRESSURE SUPPORT)
PREFILLED_SYRINGE | INTRAVENOUS | Status: AC
  Filled 2019-12-07: qty 10

## 2019-12-07 MED ORDER — PROPOFOL 10 MG/ML IV BOLUS
INTRAVENOUS | Status: AC
  Filled 2019-12-07: qty 20

## 2019-12-07 MED ORDER — LIDOCAINE 2% (20 MG/ML) 5 ML SYRINGE
INTRAMUSCULAR | Status: AC
  Filled 2019-12-07: qty 5

## 2019-12-07 MED ORDER — EPHEDRINE SULFATE-NACL 50-0.9 MG/10ML-% IV SOSY
PREFILLED_SYRINGE | INTRAVENOUS | Status: DC | PRN
  Administered 2019-12-07: 10 mg via INTRAVENOUS

## 2019-12-07 MED ORDER — CEFAZOLIN SODIUM-DEXTROSE 1-4 GM/50ML-% IV SOLN
INTRAVENOUS | Status: AC
  Filled 2019-12-07: qty 50

## 2019-12-07 SURGICAL SUPPLY — 76 items
ADH SKN CLS APL DERMABOND .7 (GAUZE/BANDAGES/DRESSINGS) ×1
BASKET BONE COLLECTION (BASKET) ×3 IMPLANT
BLADE CLIPPER SURG (BLADE) IMPLANT
BONE CANC CHIPS 20CC PCAN1/4 (Bone Implant) ×3 IMPLANT
BUR MATCHSTICK NEURO 3.0 LAGG (BURR) ×3 IMPLANT
BUR PRECISION FLUTE 5.0 (BURR) ×3 IMPLANT
CANISTER SUCT 3000ML PPV (MISCELLANEOUS) ×3 IMPLANT
CARTRIDGE OIL MAESTRO DRILL (MISCELLANEOUS) ×1 IMPLANT
CHIPS CANC BONE 20CC PCAN1/4 (Bone Implant) ×1 IMPLANT
CNTNR URN SCR LID CUP LEK RST (MISCELLANEOUS) ×1 IMPLANT
CONT SPEC 4OZ STRL OR WHT (MISCELLANEOUS) ×3
COVER BACK TABLE 24X17X13 BIG (DRAPES) IMPLANT
COVER BACK TABLE 60X90IN (DRAPES) ×3 IMPLANT
COVER WAND RF STERILE (DRAPES) ×3 IMPLANT
DECANTER SPIKE VIAL GLASS SM (MISCELLANEOUS) ×3 IMPLANT
DERMABOND ADVANCED (GAUZE/BANDAGES/DRESSINGS) ×2
DERMABOND ADVANCED .7 DNX12 (GAUZE/BANDAGES/DRESSINGS) ×1 IMPLANT
DIFFUSER DRILL AIR PNEUMATIC (MISCELLANEOUS) ×3 IMPLANT
DRAPE C-ARM 42X72 X-RAY (DRAPES) ×3 IMPLANT
DRAPE C-ARMOR (DRAPES) ×3 IMPLANT
DRAPE LAPAROTOMY 100X72X124 (DRAPES) ×3 IMPLANT
DRAPE SURG 17X23 STRL (DRAPES) ×3 IMPLANT
DRSG OPSITE POSTOP 4X6 (GAUZE/BANDAGES/DRESSINGS) ×2 IMPLANT
DURAPREP 26ML APPLICATOR (WOUND CARE) ×3 IMPLANT
ELECT REM PT RETURN 9FT ADLT (ELECTROSURGICAL) ×3
ELECTRODE REM PT RTRN 9FT ADLT (ELECTROSURGICAL) ×1 IMPLANT
GAUZE 4X4 16PLY RFD (DISPOSABLE) IMPLANT
GAUZE SPONGE 4X4 12PLY STRL (GAUZE/BANDAGES/DRESSINGS) ×3 IMPLANT
GLOVE BIO SURGEON STRL SZ8 (GLOVE) ×6 IMPLANT
GLOVE BIOGEL PI IND STRL 8 (GLOVE) ×2 IMPLANT
GLOVE BIOGEL PI IND STRL 8.5 (GLOVE) ×2 IMPLANT
GLOVE BIOGEL PI INDICATOR 8 (GLOVE) ×4
GLOVE BIOGEL PI INDICATOR 8.5 (GLOVE) ×4
GLOVE ECLIPSE 8.0 STRL XLNG CF (GLOVE) ×6 IMPLANT
GLOVE EXAM NITRILE XL STR (GLOVE) IMPLANT
GOWN STRL REUS W/ TWL LRG LVL3 (GOWN DISPOSABLE) IMPLANT
GOWN STRL REUS W/ TWL XL LVL3 (GOWN DISPOSABLE) ×2 IMPLANT
GOWN STRL REUS W/TWL 2XL LVL3 (GOWN DISPOSABLE) ×6 IMPLANT
GOWN STRL REUS W/TWL LRG LVL3 (GOWN DISPOSABLE)
GOWN STRL REUS W/TWL XL LVL3 (GOWN DISPOSABLE) ×6
GRAFT BNE CANC CHIPS 1-8 20CC (Bone Implant) IMPLANT
IMPL TLX20 11X11X31 20D (Cage) IMPLANT
KIT BASIN OR (CUSTOM PROCEDURE TRAY) ×3 IMPLANT
KIT INFUSE X SMALL 1.4CC (Orthopedic Implant) ×2 IMPLANT
KIT POSITION SURG JACKSON T1 (MISCELLANEOUS) ×3 IMPLANT
KIT TURNOVER KIT B (KITS) ×3 IMPLANT
MILL MEDIUM DISP (BLADE) ×2 IMPLANT
NDL HYPO 25X1 1.5 SAFETY (NEEDLE) ×1 IMPLANT
NDL SPNL 18GX3.5 QUINCKE PK (NEEDLE) IMPLANT
NEEDLE HYPO 25X1 1.5 SAFETY (NEEDLE) ×3 IMPLANT
NEEDLE SPNL 18GX3.5 QUINCKE PK (NEEDLE) IMPLANT
NS IRRIG 1000ML POUR BTL (IV SOLUTION) ×3 IMPLANT
OIL CARTRIDGE MAESTRO DRILL (MISCELLANEOUS) ×3
PACK LAMINECTOMY NEURO (CUSTOM PROCEDURE TRAY) ×3 IMPLANT
PAD ARMBOARD 7.5X6 YLW CONV (MISCELLANEOUS) ×9 IMPLANT
PATTIES SURGICAL .5 X.5 (GAUZE/BANDAGES/DRESSINGS) IMPLANT
PATTIES SURGICAL .5 X1 (DISPOSABLE) IMPLANT
PATTIES SURGICAL 1X1 (DISPOSABLE) IMPLANT
ROD RELINE LORDOTIC 5.5X45 (Rod) ×2 IMPLANT
ROD RELINE-O LORD 5.5X40 (Rod) ×2 IMPLANT
SCREW LOCK RELINE 5.5 TULIP (Screw) ×8 IMPLANT
SCREW RELINE-O POLY 6.5X50MM (Screw) ×8 IMPLANT
SPONGE LAP 4X18 RFD (DISPOSABLE) IMPLANT
SPONGE SURGIFOAM ABS GEL 100 (HEMOSTASIS) ×3 IMPLANT
STAPLER SKIN PROX WIDE 3.9 (STAPLE) IMPLANT
SUT VIC AB 1 CT1 18XBRD ANBCTR (SUTURE) ×2 IMPLANT
SUT VIC AB 1 CT1 8-18 (SUTURE) ×6
SUT VIC AB 2-0 CT1 18 (SUTURE) ×6 IMPLANT
SUT VIC AB 3-0 SH 8-18 (SUTURE) ×6 IMPLANT
SYR 3ML LL SCALE MARK (SYRINGE) ×6 IMPLANT
SYR 5ML LL (SYRINGE) IMPLANT
TLX20 IMPLANT 11X11X31 20D (Cage) ×3 IMPLANT
TOWEL GREEN STERILE (TOWEL DISPOSABLE) ×3 IMPLANT
TOWEL GREEN STERILE FF (TOWEL DISPOSABLE) ×3 IMPLANT
TRAY FOLEY MTR SLVR 16FR STAT (SET/KITS/TRAYS/PACK) ×3 IMPLANT
WATER STERILE IRR 1000ML POUR (IV SOLUTION) ×3 IMPLANT

## 2019-12-07 NOTE — Brief Op Note (Signed)
12/07/2019  10:21 AM  PATIENT:  Jeffrey [name]  74 y.o. male  PRE-OPERATIVE DIAGNOSIS:  Degenerative lumbar spinal stenosis,spondylolisthesis, recurrent far lateral disc herniation, radiculopathy, lumbago L 45 level  POST-OPERATIVE DIAGNOSIS:  Degenerative lumbar spinal stenosis,spondylolisthesis, recurrent far lateral disc herniation, radiculopathy, lumbago L 45 level  PROCEDURE:  Procedure(s) with comments: Lumbar Four-Five Transforaminal lumbar interbody fusion (N/A) - Lumbar Four-Five Transforaminal lumbar interbody fusion with redo discectomy and pedicle screw fixation with posterolateral arthrodesis  SURGEON:  Surgeon(s) and Role:    Erline Levine, MD - Primary    * Vallarie Mare, MD - Assisting  PHYSICIAN ASSISTANT:   ASSISTANTS: Potaet, RN   ANESTHESIA:   general  EBL:  50 mL   BLOOD ADMINISTERED:none  DRAINS: none   LOCAL MEDICATIONS USED:  MARCAINE    and LIDOCAINE   SPECIMEN:  No Specimen  DISPOSITION OF SPECIMEN:  N/A  COUNTS:  YES  TOURNIQUET:  * No tourniquets in log *  DICTATION: Patient is 74 year old man with mobile spondylolisthesis of L4 on L5 with lumbar stenosis and recurrent disc herniation with far lateral HNP causing right L 4 nerve root compression. He has a severe right  L4 radiculopathy. It was elected to take him to surgery for redo decompression and fusion at this level.   Procedure: Patient was placed in a prone position on the [facility] table after smooth and uncomplicated induction of general endotracheal anesthesia. His low back was prepped and draped in usual sterile fashion with betadine scrub and DuraPrep after performing preoperative localizing X rays with LessRay. Area of incision was infiltrated with local lidocaine. Incision was made to the lumbodorsal fascia was incised and exposure was performed of the L4 through L5 spinous processes laminae facet joint and transverse processes. Intraoperative x-ray was obtained which confirmed  correct orientation. A total right hemi-laminectomy of L4 was performed with disarticulation of the facet joints at this level and thorough decompression was performed of both L4 and L5 nerve roots along with the common dural tube. This decompression was more involved than would be typical of that performed for PLIF alone and included painstaking dissection of adherent ligament compressing the thecal sac and wide decompression of all neural elements. There was a large disc herniation laterally, causing significant right L 4 nerve root compression.  A thorough discectomy was  performed on the right with preparation of the endplates for grafting a trial spacer was placed this level. 10 cc Bone autograft was packed within the interspace on the right along with extra small BMP . An expandable titanium TLIF cage (11 x 11 x 31 mm x 15 degrees)  was inserted the interspace and countersunk appropriately and torqued to 75% and packed with 6 cc of morselized bone autograft. The posterolateral region was extensively decorticated and pedicle probes were placed at L4 and L5 bilaterally. Intraoperative fluoroscopy confirmed correct orientationin the AP and lateral plane. 50 x 6.5 mm pedicle screws were placed at L5 bilaterally and 50 x 6.5 mm screws placed at L4 bilaterally final x-rays demonstrated well-positioned interbody grafts and pedicle screw fixation. A 40 mm lordotic rod was placed on the right and a 45 mm rod was placed on the left locked down in situ and the posterolateral region on the left was packed with the remaining BMP and 20 cc bone autograft and allograft. The wound was irrigated and long-acting Marcaine was injected into the deep musculature. Fascia was closed with 1 Vicryl sutures skin edges were reapproximated 2 and  3-0 Vicryl sutures. The wound is dressed with Dermabond and an occlusive dressing. The patient was extubated in the operating room and taken to recovery in stable satisfactory condition. He  tolerated the operation well.  Counts were correct at the end of the case.   PLAN OF CARE: Admit to inpatient   PATIENT DISPOSITION:  PACU - hemodynamically stable.   Delay start of Pharmacological VTE agent (>24hrs) due to surgical blood loss or risk of bleeding: yes

## 2019-12-07 NOTE — Progress Notes (Signed)
Awake, alert, conversant.  Sore in back.  Full strength both legs.  Patient feels right leg is better.  Less painful.  Doing well.

## 2019-12-07 NOTE — Anesthesia Postprocedure Evaluation (Signed)
Anesthesia Post Note  Patient: Jeffrey [name]  Procedure(s) Performed: Lumbar Four-Five Transforaminal lumbar interbody fusion (N/A Back)     Patient location during evaluation: PACU Anesthesia Type: General Level of consciousness: awake and alert Pain management: pain level controlled Vital Signs Assessment: post-procedure vital signs reviewed and stable Respiratory status: spontaneous breathing, nonlabored ventilation, respiratory function stable and patient connected to nasal cannula oxygen Cardiovascular status: blood pressure returned to baseline and stable Postop Assessment: no apparent nausea or vomiting Anesthetic complications: no    Last Vitals:  Vitals:   12/07/19 1200 12/07/19 1309  BP: 136/81 (!) 154/82  Pulse: 80 85  Resp: (!) 25 20  Temp:  (!) 36.4 C  SpO2: 92% 97%    Last Pain:  Vitals:   12/07/19 1518  TempSrc:   PainSc: 8                  Joliet Mallozzi COKER

## 2019-12-07 NOTE — Care Management Obs Status (Signed)
MEDICARE OBSERVATION STATUS NOTIFICATION   Patient Details  Name: Jeffrey [name] MRN: [id number] Date of Birth: 28-Jul-1946   Medicare Observation Status Notification Given:  Yes    Lorel Lembo, LCSW 12/07/2019, 6:07 PM

## 2019-12-07 NOTE — Transfer of Care (Signed)
Immediate Anesthesia Transfer of Care Note  Patient: Jeffrey [name]  Procedure(s) Performed: Lumbar Four-Five Transforaminal lumbar interbody fusion (N/A Back)  Patient Location: PACU  Anesthesia Type:General  Level of Consciousness: awake, alert , oriented and patient cooperative  Airway & Oxygen Therapy: Patient Spontanous Breathing and Patient connected to face mask oxygen  Post-op Assessment: Report given to RN, Post -op Vital signs reviewed and stable and Patient moving all extremities  Post vital signs: Reviewed and stable  Last Vitals:  Vitals Value Taken Time  BP 135/72 12/07/19 1026  Temp    Pulse 67 12/07/19 1035  Resp 22 12/07/19 1035  SpO2 96 % 12/07/19 1035  Vitals shown include unvalidated device data.  Last Pain:  Vitals:   12/07/19 0605  TempSrc: Oral  PainSc: 0-No pain         Complications: No apparent anesthesia complications

## 2019-12-07 NOTE — Care Management CC44 (Signed)
Condition Code 44 Documentation Completed  Patient Details  Name: Jeffrey [name] MRN: [id number] Date of Birth: 1945/10/01   Condition Code 44 given:  Yes Patient signature on Condition Code 44 notice:  Yes Documentation of 2 MD's agreement:  Yes Code 44 added to claim:  Yes    Jericka Kadar, LCSW 12/07/2019, 6:07 PM

## 2019-12-07 NOTE — Op Note (Signed)
12/07/2019  10:21 AM  PATIENT:  Jeffrey [name]  74 y.o. male  PRE-OPERATIVE DIAGNOSIS:  Degenerative lumbar spinal stenosis,spondylolisthesis, recurrent far lateral disc herniation, radiculopathy, lumbago L 45 level  POST-OPERATIVE DIAGNOSIS:  Degenerative lumbar spinal stenosis,spondylolisthesis, recurrent far lateral disc herniation, radiculopathy, lumbago L 45 level  PROCEDURE:  Procedure(s) with comments: Lumbar Four-Five Transforaminal lumbar interbody fusion (N/A) - Lumbar Four-Five Transforaminal lumbar interbody fusion with redo discectomy and pedicle screw fixation with posterolateral arthrodesis  SURGEON:  Surgeon(s) and Role:    Erline Levine, MD - Primary    * Vallarie Mare, MD - Assisting  PHYSICIAN ASSISTANT:   ASSISTANTS: [place], RN   ANESTHESIA:   general  EBL:  50 mL   BLOOD ADMINISTERED:none  DRAINS: none   LOCAL MEDICATIONS USED:  MARCAINE    and LIDOCAINE   SPECIMEN:  No Specimen  DISPOSITION OF SPECIMEN:  N/A  COUNTS:  YES  TOURNIQUET:  * No tourniquets in log *  DICTATION: Patient is 74 year old man with mobile spondylolisthesis of L4 on L5 with lumbar stenosis and recurrent disc herniation with far lateral HNP causing right L 4 nerve root compression. He has a severe right  L4 radiculopathy. It was elected to take him to surgery for redo decompression and fusion at this level.   Procedure: Patient was placed in a prone position on the [place] table after smooth and uncomplicated induction of general endotracheal anesthesia. His low back was prepped and draped in usual sterile fashion with betadine scrub and DuraPrep after performing preoperative localizing X rays with LessRay. Area of incision was infiltrated with local lidocaine. Incision was made to the lumbodorsal fascia was incised and exposure was performed of the L4 through L5 spinous processes laminae facet joint and transverse processes. Intraoperative x-ray was obtained which confirmed  correct orientation. A total right hemi-laminectomy of L4 was performed with disarticulation of the facet joints at this level and thorough decompression was performed of both L4 and L5 nerve roots along with the common dural tube. This decompression was more involved than would be typical of that performed for PLIF alone and included painstaking dissection of adherent ligament compressing the thecal sac and wide decompression of all neural elements. There was a large disc herniation laterally, causing significant right L 4 nerve root compression.  A thorough discectomy was  performed on the right with preparation of the endplates for grafting a trial spacer was placed this level. 10 cc Bone autograft was packed within the interspace on the right along with extra small BMP . An expandable titanium TLIF cage (11 x 11 x 31 mm x 15 degrees)  was inserted the interspace and countersunk appropriately and torqued to 75% and packed with 6 cc of morselized bone autograft. The posterolateral region was extensively decorticated and pedicle probes were placed at L4 and L5 bilaterally. Intraoperative fluoroscopy confirmed correct orientationin the AP and lateral plane. 50 x 6.5 mm pedicle screws were placed at L5 bilaterally and 50 x 6.5 mm screws placed at L4 bilaterally final x-rays demonstrated well-positioned interbody grafts and pedicle screw fixation. A 40 mm lordotic rod was placed on the right and a 45 mm rod was placed on the left locked down in situ and the posterolateral region on the left was packed with the remaining BMP and 20 cc bone autograft and allograft. The wound was irrigated and long-acting Marcaine was injected into the deep musculature. Fascia was closed with 1 Vicryl sutures skin edges were reapproximated 2 and  3-0 Vicryl sutures. The wound is dressed with Dermabond and an occlusive dressing. The patient was extubated in the operating room and taken to recovery in stable satisfactory condition. He  tolerated the operation well.  Counts were correct at the end of the case.   PLAN OF CARE: Admit to inpatient   PATIENT DISPOSITION:  PACU - hemodynamically stable.   Delay start of Pharmacological VTE agent (>24hrs) due to surgical blood loss or risk of bleeding: yes

## 2019-12-07 NOTE — Anesthesia Procedure Notes (Signed)
Procedure Name: Intubation Date/Time: 12/07/2019 7:36 AM Performed by: Myna Bright, CRNA Pre-anesthesia Checklist: Patient identified, Emergency Drugs available, Suction available and Patient being monitored Patient Re-evaluated:Patient Re-evaluated prior to induction Oxygen Delivery Method: Circle system utilized Preoxygenation: Pre-oxygenation with 100% oxygen Induction Type: IV induction Ventilation: Mask ventilation with difficulty and Oral airway inserted - appropriate to patient size Laryngoscope Size: Mac and 4 Grade View: Grade I Tube type: Oral Tube size: 7.5 mm Number of attempts: 1 Airway Equipment and Method: Stylet Placement Confirmation: ETT inserted through vocal cords under direct vision,  positive ETCO2 and breath sounds checked- equal and bilateral Secured at: 22 cm Tube secured with: Tape Dental Injury: Teeth and Oropharynx as per pre-operative assessment

## 2019-12-07 NOTE — Interval H&P Note (Signed)
History and Physical Interval Note:  12/07/2019 7:29 AM  Drusilla Kanner  has presented today for surgery, with the diagnosis of Degenerative lumbar spinal stenosis.  The various methods of treatment have been discussed with the patient and family. After consideration of risks, benefits and other options for treatment, the patient has consented to  Procedure(s): Lumbar 4-5 Transforaminal lumbar interbody fusion (N/A) as a surgical intervention.  The patient's history has been reviewed, patient examined, no change in status, stable for surgery.  I have reviewed the patient's chart and labs.  Questions were answered to the patient's satisfaction.     Peggyann Shoals

## 2019-12-07 NOTE — Anesthesia Preprocedure Evaluation (Signed)
Anesthesia Evaluation  Patient identified by MRN, date of birth, ID band Patient awake    Reviewed: Allergy & Precautions, NPO status , Patient's Chart, lab work & pertinent test results  Airway Mallampati: II  TM Distance: >3 FB Neck ROM: Full    Dental  (+) Teeth Intact, Dental Advisory Given   Pulmonary    breath sounds clear to auscultation       Cardiovascular  Rhythm:Regular Rate:Normal     Neuro/Psych    GI/Hepatic   Endo/Other    Renal/GU      Musculoskeletal   Abdominal (+) + obese,   Peds  Hematology   Anesthesia Other Findings   Reproductive/Obstetrics                             Anesthesia Physical Anesthesia Plan  ASA: II  Anesthesia Plan: General   Post-op Pain Management:    Induction: Intravenous  PONV Risk Score and Plan: Ondansetron and Dexamethasone  Airway Management Planned: Oral ETT  Additional Equipment:   Intra-op Plan:   Post-operative Plan: Extubation in OR  Informed Consent: I have reviewed the patients History and Physical, chart, labs and discussed the procedure including the risks, benefits and alternatives for the proposed anesthesia with the patient or authorized representative who has indicated his/her understanding and acceptance.     Dental advisory given  Plan Discussed with: CRNA and Anesthesiologist  Anesthesia Plan Comments:         Anesthesia Quick Evaluation

## 2019-12-08 DIAGNOSIS — M4316 Spondylolisthesis, lumbar region: Secondary | ICD-10-CM | POA: Diagnosis not present

## 2019-12-08 DIAGNOSIS — M48061 Spinal stenosis, lumbar region without neurogenic claudication: Secondary | ICD-10-CM | POA: Diagnosis not present

## 2019-12-08 DIAGNOSIS — M5116 Intervertebral disc disorders with radiculopathy, lumbar region: Secondary | ICD-10-CM | POA: Diagnosis not present

## 2019-12-08 DIAGNOSIS — Z981 Arthrodesis status: Secondary | ICD-10-CM | POA: Diagnosis not present

## 2019-12-08 DIAGNOSIS — R030 Elevated blood-pressure reading, without diagnosis of hypertension: Secondary | ICD-10-CM | POA: Diagnosis not present

## 2019-12-08 MED ORDER — METHOCARBAMOL 500 MG PO TABS: 500.0000 mg | ORAL_TABLET | Freq: Four times a day (QID) | ORAL | 0 refills | Status: DC | PRN

## 2019-12-08 MED ORDER — DOCUSATE SODIUM 100 MG PO CAPS: 100.0000 mg | ORAL_CAPSULE | Freq: Two times a day (BID) | ORAL | 0 refills | Status: DC

## 2019-12-08 MED ORDER — OXYCODONE HCL 5 MG PO TABS: 5.0000 mg | ORAL_TABLET | ORAL | 0 refills | Status: DC | PRN

## 2019-12-08 NOTE — Discharge Instructions (Signed)

## 2019-12-08 NOTE — Evaluation (Signed)
Occupational Therapy Evaluation Patient Details Name: Jeffrey [name] MRN: [id number] DOB: 1946/01/31 Today's Date: 12/08/2019    History of Present Illness Pt is a 74 y/o male s/p L4-L5 TLIF. PMH including but not limited to previous cervical and lumbar surgeries.   Clinical Impression   Pt PTA: Pt living independently at home with spouse in 1L home, walk in shower. Pt supervisionA for ADL and mobility at this time. Pt education for LB AE and pt with good carry over skills after demo for ADL routine. Pt unable to perform figure 4 technique so pt standing for LB dressing, bracing self with wall. Pt with no LOB episodes. Pt does not require cues to avoid breaking precautions.  Back handout provided and reviewed ADL in detail. Pt educated on: clothing between brace, never sleep in brace, set an alarm at night for medication, avoid sitting for long periods of time, correct bed positioning for sleeping, correct sequence for bed mobility, avoiding lifting more than 5 pounds and never wash directly over incision. All education is complete and patient indicates understanding. No further OT skilled services required. OT signing off.     Follow Up Recommendations  No OT follow up    Equipment Recommendations  None recommended by OT    Recommendations for Other Services       Precautions / Restrictions Precautions Precautions: Fall;Back Precaution Booklet Issued: Yes (comment) Precaution Comments: reviewed 3/3 back precautions with pt throughout Required Braces or Orthoses: Spinal Brace Spinal Brace: Lumbar corset Restrictions Weight Bearing Restrictions: No      Mobility Bed Mobility Overal bed mobility: Needs Assistance Bed Mobility: Rolling;Sidelying to Sit Rolling: Supervision Sidelying to sit: Supervision       General bed mobility comments: cueing for log roll technique  Transfers Overall transfer level: Needs assistance Equipment used: None Transfers: Sit to/from  Stand Sit to Stand: Supervision         General transfer comment: good technique, steady with transition    Balance Overall balance assessment: Needs assistance Sitting-balance support: Feet supported Sitting balance-Leahy Scale: Good     Standing balance support: During functional activity;No upper extremity supported Standing balance-Leahy Scale: Fair                             ADL either performed or assessed with clinical judgement   ADL Overall ADL's : At baseline                                       General ADL Comments: Pt standing for LB donning and sitting for LB doffing. Pt plans to avoid wearing socks insteadhe plans to wear slip on shoes. Pt donning/doffing brace with no VCs and does not require cues to avoid breaking precautions. AE education provided and demonstration.     Vision Baseline Vision/History: Wears glasses Wears Glasses: At all times Patient Visual Report: No change from baseline Vision Assessment?: No apparent visual deficits     Perception     Praxis      Pertinent Vitals/Pain Pain Assessment: Faces Faces Pain Scale: Hurts little more Pain Location: back Pain Descriptors / Indicators: Guarding;Sore Pain Intervention(s): Monitored during session     Hand Dominance Right   Extremity/Trunk Assessment Upper Extremity Assessment Upper Extremity Assessment: Overall WFL for tasks assessed   Lower Extremity Assessment Lower Extremity Assessment: Overall WFL for tasks assessed  Cervical / Trunk Assessment Cervical / Trunk Assessment: Other exceptions Cervical / Trunk Exceptions: s/p lumbar sx   Communication Communication Communication: No difficulties   Cognition Arousal/Alertness: Awake/alert Behavior During Therapy: WFL for tasks assessed/performed Overall Cognitive Status: Within Functional Limits for tasks assessed                                     General Comments  Spouse  present to conclude session    Exercises     Shoulder Instructions      Home Living Family/patient expects to be discharged to:: Private residence Living Arrangements: Spouse/significant other Available Help at Discharge: Family;Available 24 hours/day Type of Home: House Home Access: Stairs to enter [employer] of Steps: 1   Home Layout: One level     Bathroom Shower/Tub: Chief Strategy Officer: None          Prior Functioning/Environment Level of Independence: Independent                 OT Problem List: Decreased activity tolerance      OT Treatment/Interventions:      OT Goals(Current goals can be found in the care plan section) Acute Rehab OT Goals Patient Stated Goal: decrease pain  OT Frequency:     Barriers to D/C:            Co-evaluation              AM-PAC OT "6 Clicks" Daily Activity     Outcome Measure Help from another person eating meals?: None Help from another person taking care of personal grooming?: None Help from another person toileting, which includes using toliet, bedpan, or urinal?: None Help from another person bathing (including washing, rinsing, drying)?: A Little Help from another person to put on and taking off regular upper body clothing?: None Help from another person to put on and taking off regular lower body clothing?: A Little 6 Click Score: 22   End of Session Equipment Utilized During Treatment: Back brace Nurse Communication: Mobility status  Activity Tolerance: Patient tolerated treatment well Patient left: in bed;with call bell/phone within reach;with family/visitor present  OT Visit Diagnosis: Unsteadiness on feet (R26.81);Pain Pain - part of body: (back)                Time: [id number] OT Time Calculation (min): 44 min Charges:  OT General Charges $OT Visit: 1 Visit OT Evaluation $OT Eval Moderate Complexity: 1 Mod OT Treatments $Self Care/Home Management : 23-37  mins  Jefferey Pica, OTR/L Acute Rehabilitation Services Pager: [phone] Office: [phone]   Tationna Fullard C 12/08/2019, 9:58 AM

## 2019-12-08 NOTE — Discharge Summary (Signed)
Physician Discharge Summary  Patient ID: Jeffrey [name] MRN: [id number] DOB/AGE: 1946/05/22 74 y.o.  Admit date: 12/07/2019 Discharge date: 12/08/2019  Admission Diagnoses: Lumbar and lumbosacral spondylolisthesis  Discharge Diagnoses: The same Active Problems:   Spondylolisthesis of lumbar region   Discharged Condition: good  Hospital Course: Dr. Vertell Limber performed an L4-5 and L5-S1 decompression, instrumentation and fusion on the patient on 12/07/2019.  The patient's postoperative course was unremarkable.  On postoperative day #1 the patient requested discharge to home.  He was given written and oral discharge instructions.  All his questions were answered.  Consults: PT, OT, care management Significant Diagnostic Studies: None Treatments: L4-5 and L5-S1 decompression, instrumentation and fusion. Discharge Exam: Blood pressure (!) 143/71, pulse 73, temperature 98.7 F (37.1 C), temperature source Oral, resp. rate 18, height 5\' 11"  (1.803 m), weight 123.5 kg, SpO2 98 %. The patient is alert and pleasant.  His lower extremity strength is grossly normal.  Disposition: Home  Discharge Instructions    Call MD for:  difficulty breathing, headache or visual disturbances   Complete by: As directed    Call MD for:  extreme fatigue   Complete by: As directed    Call MD for:  hives   Complete by: As directed    Call MD for:  persistant dizziness or light-headedness   Complete by: As directed    Call MD for:  persistant nausea and vomiting   Complete by: As directed    Call MD for:  redness, tenderness, or signs of infection (pain, swelling, redness, odor or green/yellow discharge around incision site)   Complete by: As directed    Call MD for:  severe uncontrolled pain   Complete by: As directed    Call MD for:  temperature >100.4   Complete by: As directed    Diet - low sodium heart healthy   Complete by: As directed    Discharge instructions   Complete by: As directed    Call  [phone] for a followup appointment. Take a stool softener while you are using pain medications.   Driving Restrictions   Complete by: As directed    Do not drive for 2 weeks.   Increase activity slowly   Complete by: As directed    Lifting restrictions   Complete by: As directed    Do not lift more than 5 pounds. No excessive bending or twisting.   May shower / Bathe   Complete by: As directed    Remove the dressing for 3 days after surgery.  You may shower, but leave the incision alone.   Remove dressing in 48 hours   Complete by: As directed    Your stitches are under the scan and will dissolve by themselves. The Steri-Strips will fall off after you take a few showers. Do not rub back or pick at the wound, Leave the wound alone.     Allergies as of 12/08/2019   No Known Allergies     Medication List    TAKE these medications   B-complex with vitamin C tablet Take 1 tablet by mouth in the morning and at bedtime.   docusate sodium 100 MG capsule Commonly known as: COLACE Take 1 capsule (100 mg total) by mouth 2 (two) times daily.   gabapentin 300 MG capsule Commonly known as: NEURONTIN Take 300 mg by mouth 3 (three) times daily.   methocarbamol 500 MG tablet Commonly known as: ROBAXIN Take 1 tablet (500 mg total) by mouth every 6 (six)  hours as needed for muscle spasms.   multivitamin with minerals Tabs tablet Take 1 tablet by mouth daily.   oxyCODONE 5 MG immediate release tablet Commonly known as: Oxy IR/ROXICODONE Take 1-2 tablets (5-10 mg total) by mouth every 4 (four) hours as needed for severe pain ((score 4 to 6)).   polycarbophil 625 MG tablet Commonly known as: FIBERCON Take 625 mg by mouth in the morning and at bedtime.   PROBIOTIC PO Take 1 tablet by mouth daily.   VITAMIN D3 PO Take 1 tablet by mouth daily.        Signed: Ophelia Charter 12/08/2019, 7:50 AM

## 2019-12-08 NOTE — Progress Notes (Signed)
Patient is discharged from room 3C04 at this time. Alert and in stable condition. IV site d/c'd and instructions read to patient and spouse with understanding verbalized and all questions answered. Left unit via wheelchair with all belongings at side. 

## 2019-12-08 NOTE — Evaluation (Signed)
Physical Therapy Evaluation & Discharge  Patient Details Name: Jeffrey [name] MRN: [id number] DOB: 04-Jul-1946 Today's Date: 12/08/2019   History of Present Illness  Pt is a 74 y/o male s/p L4-L5 TLIF. PMH including but not limited to previous cervical and lumbar surgeries.  Clinical Impression  Pt presented supine in bed with HOB elevated, awake and willing to participate in therapy session. Prior to admission, pt reported that he was independent with all functional mobility and ADLs. Pt lives with his spouse who can provide 24/7 supervision/assistance if needed upon d/c home. At the time of evaluation, pt overall moving well at a supervision to min guard level with all mobility including gait training in the hallway. PT provided pt education re: back precautions with handout provided, car transfers with demonstration and a generalized walking program for pt to initiate upon d/c home. Pt expressed understanding. No further acute PT needs identified at this time. PT signing off.     Follow Up Recommendations No PT follow up    Equipment Recommendations  None recommended by PT    Recommendations for Other Services       Precautions / Restrictions Precautions Precautions: Fall;Back Precaution Booklet Issued: Yes (comment) Precaution Comments: reviewed 3/3 back precautions with pt throughout Required Braces or Orthoses: Spinal Brace Spinal Brace: Lumbar corset Restrictions Weight Bearing Restrictions: No      Mobility  Bed Mobility Overal bed mobility: Needs Assistance Bed Mobility: Rolling;Sidelying to Sit Rolling: Supervision Sidelying to sit: Supervision       General bed mobility comments: cueing for log roll technique  Transfers Overall transfer level: Needs assistance Equipment used: None Transfers: Sit to/from Stand Sit to Stand: Supervision         General transfer comment: good technique, steady with transition  Ambulation/Gait Ambulation/Gait assistance: Min  guard;Supervision Gait Distance (Feet): 500 Feet Assistive device: None Gait Pattern/deviations: Step-through pattern;Decreased stride length     General Gait Details: pt with slow, cautious gait; several standing rest breaks needed; no overt LOB or need for physical assistance  Stairs            Wheelchair Mobility    Modified Rankin (Stroke Patients Only)       Balance Overall balance assessment: Needs assistance Sitting-balance support: Feet supported Sitting balance-Leahy Scale: Good     Standing balance support: During functional activity;No upper extremity supported Standing balance-Leahy Scale: Fair                               Pertinent Vitals/Pain Pain Assessment: Faces Faces Pain Scale: Hurts little more Pain Location: back Pain Descriptors / Indicators: Guarding;Sore Pain Intervention(s): Monitored during session;Repositioned    Home Living Family/patient expects to be discharged to:: Private residence Living Arrangements: Spouse/significant other Available Help at Discharge: Family;Available 24 hours/day Type of Home: House Home Access: Stairs to enter   [employer] of Steps: 1 Home Layout: One level Home Equipment: None      Prior Function Level of Independence: Independent               Hand Dominance        Extremity/Trunk Assessment   Upper Extremity Assessment Upper Extremity Assessment: Defer to OT evaluation;Overall WFL for tasks assessed    Lower Extremity Assessment Lower Extremity Assessment: Overall WFL for tasks assessed    Cervical / Trunk Assessment Cervical / Trunk Assessment: Other exceptions Cervical / Trunk Exceptions: s/p lumbar sx  Communication  Communication: No difficulties  Cognition Arousal/Alertness: Awake/alert Behavior During Therapy: WFL for tasks assessed/performed Overall Cognitive Status: Within Functional Limits for tasks assessed                                         General Comments      Exercises     Assessment/Plan    PT Assessment Patent does not need any further PT services  PT Problem List         PT Treatment Interventions      PT Goals (Current goals can be found in the Care Plan section)  Acute Rehab PT Goals Patient Stated Goal: decrease pain PT Goal Formulation: All assessment and education complete, DC therapy    Frequency     Barriers to discharge        Co-evaluation               AM-PAC PT "6 Clicks" Mobility  Outcome Measure Help needed turning from your back to your side while in a flat bed without using bedrails?: None Help needed moving from lying on your back to sitting on the side of a flat bed without using bedrails?: None Help needed moving to and from a bed to a chair (including a wheelchair)?: None Help needed standing up from a chair using your arms (e.g., wheelchair or bedside chair)?: None Help needed to walk in hospital room?: None Help needed climbing 3-5 steps with a railing? : A Little 6 Click Score: 23    End of Session Equipment Utilized During Treatment: Back brace Activity Tolerance: Patient tolerated treatment well Patient left: in chair;with call bell/phone within reach Nurse Communication: Mobility status PT Visit Diagnosis: Other abnormalities of gait and mobility (R26.89)    Time: [id number] PT Time Calculation (min) (ACUTE ONLY): 27 min   Charges:   PT Evaluation $PT Eval Low Complexity: 1 Low PT Treatments $Gait Training: 8-22 mins         Anastasio Champion, DPT  Acute Rehabilitation Services Pager [phone] Office [place] 12/08/2019, 9:18 AM

## 2019-12-10 ENCOUNTER — Encounter: Payer: Self-pay | Admitting: *Deleted

## 2019-12-10 MED FILL — Thrombin For Soln Kit 20000 Unit: CUTANEOUS | Qty: 1 | Status: AC

## 2019-12-26 DIAGNOSIS — M5126 Other intervertebral disc displacement, lumbar region: Secondary | ICD-10-CM | POA: Diagnosis not present

## 2019-12-26 DIAGNOSIS — M5416 Radiculopathy, lumbar region: Secondary | ICD-10-CM | POA: Diagnosis not present

## 2019-12-26 DIAGNOSIS — M4316 Spondylolisthesis, lumbar region: Secondary | ICD-10-CM | POA: Diagnosis not present

## 2019-12-26 DIAGNOSIS — M545 Low back pain: Secondary | ICD-10-CM | POA: Diagnosis not present

## 2020-02-06 DIAGNOSIS — Z6837 Body mass index (BMI) 37.0-37.9, adult: Secondary | ICD-10-CM | POA: Diagnosis not present

## 2020-02-06 DIAGNOSIS — R030 Elevated blood-pressure reading, without diagnosis of hypertension: Secondary | ICD-10-CM | POA: Diagnosis not present

## 2020-02-06 DIAGNOSIS — M545 Low back pain: Secondary | ICD-10-CM | POA: Diagnosis not present

## 2020-02-06 DIAGNOSIS — M48061 Spinal stenosis, lumbar region without neurogenic claudication: Secondary | ICD-10-CM | POA: Diagnosis not present

## 2020-02-06 DIAGNOSIS — M4316 Spondylolisthesis, lumbar region: Secondary | ICD-10-CM | POA: Diagnosis not present

## 2020-02-06 DIAGNOSIS — M5416 Radiculopathy, lumbar region: Secondary | ICD-10-CM | POA: Diagnosis not present

## 2020-02-06 DIAGNOSIS — M5126 Other intervertebral disc displacement, lumbar region: Secondary | ICD-10-CM | POA: Diagnosis not present

## 2020-05-12 DIAGNOSIS — M48061 Spinal stenosis, lumbar region without neurogenic claudication: Secondary | ICD-10-CM | POA: Diagnosis not present

## 2020-05-12 DIAGNOSIS — M5416 Radiculopathy, lumbar region: Secondary | ICD-10-CM | POA: Diagnosis not present

## 2020-05-12 DIAGNOSIS — M545 Low back pain: Secondary | ICD-10-CM | POA: Diagnosis not present

## 2020-05-12 DIAGNOSIS — M5126 Other intervertebral disc displacement, lumbar region: Secondary | ICD-10-CM | POA: Diagnosis not present

## 2020-06-05 DIAGNOSIS — Z23 Encounter for immunization: Secondary | ICD-10-CM | POA: Diagnosis not present

## 2020-06-18 IMAGING — RF DG LUMBAR SPINE 2-3V
1 series · 2 of 2 positions shown · non-contrast
Comparison: None.

CLINICAL DATA: Surgical posterior fusion of L4-5.

EXAM:
LUMBAR SPINE - 2-3 VIEW; DG C-ARM 1-60 MIN
FLUOROSCOPY TIME:  13 seconds.

[Series 1: run · 2 of 2 slices shown]
[im 1/2]
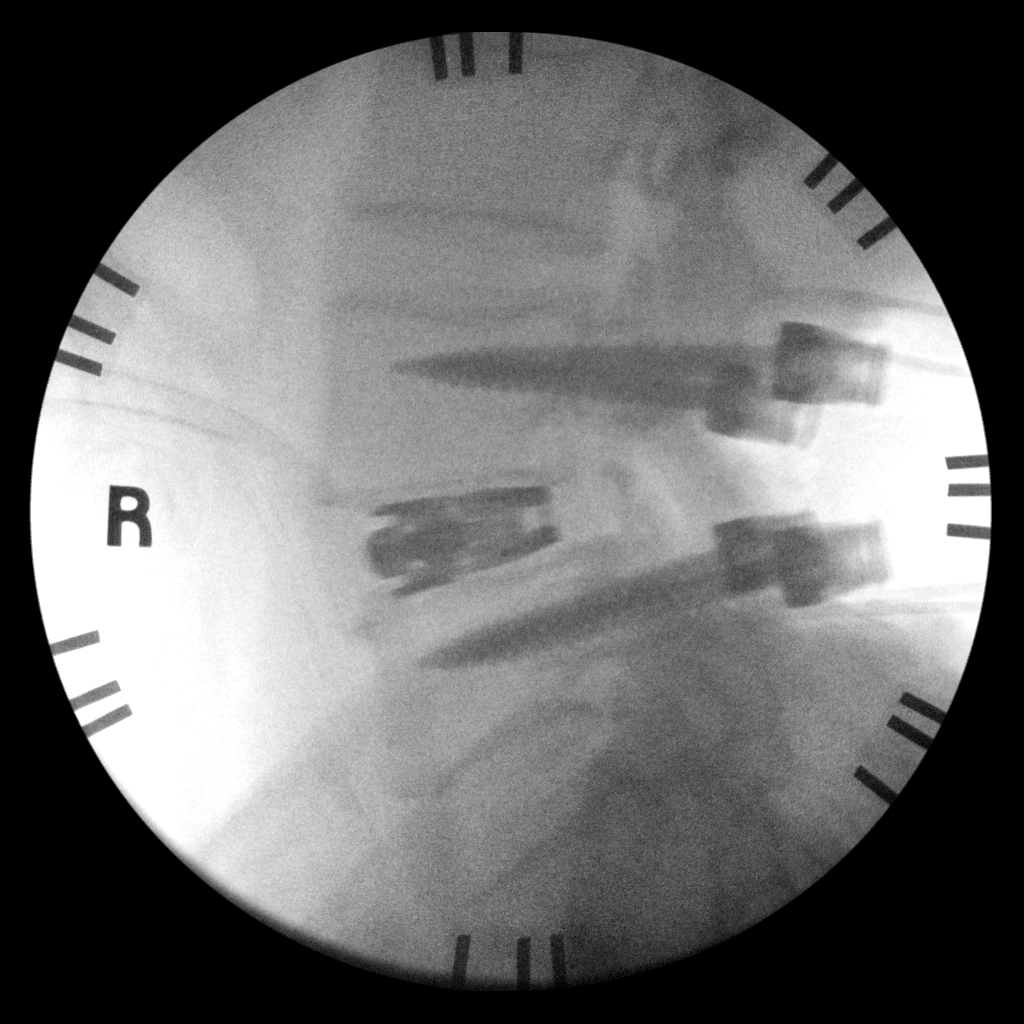
[im 2/2]
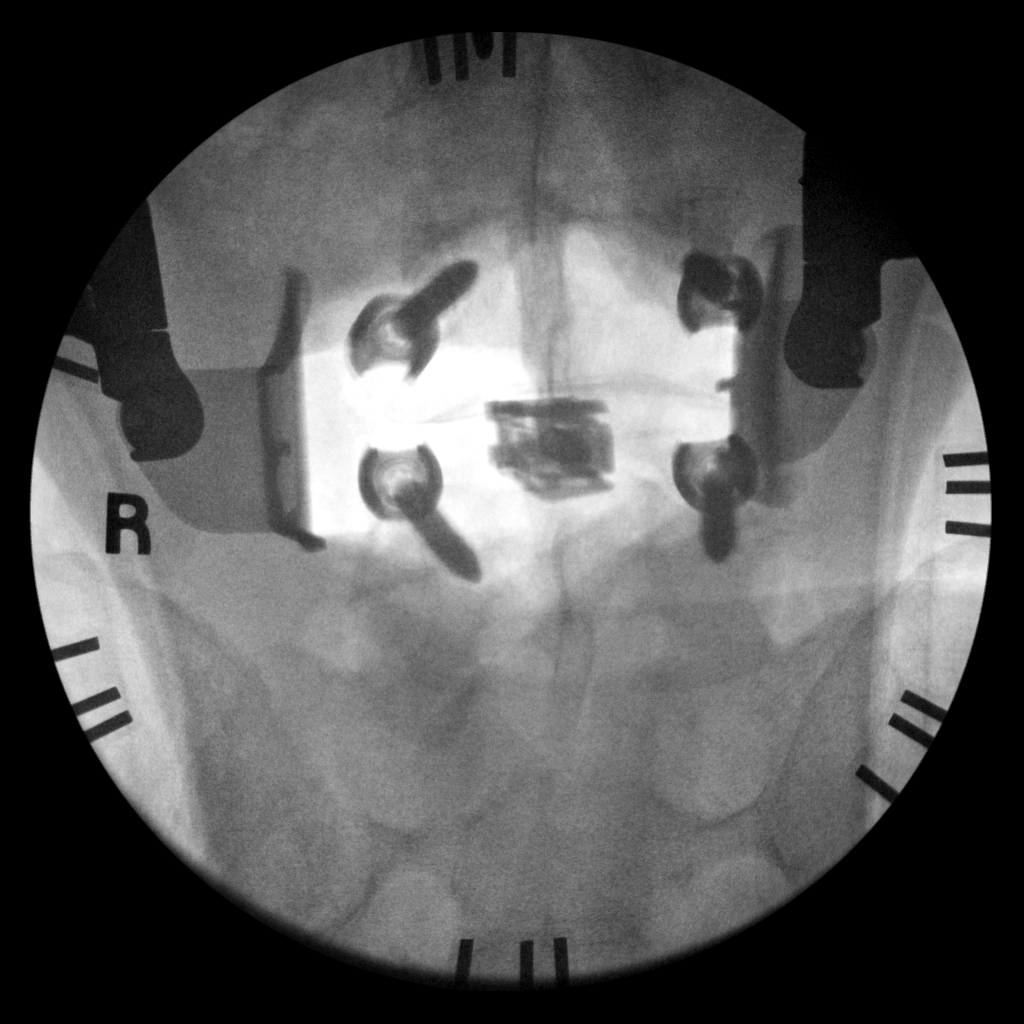

[2 of 2 positions shown; findings below may reference images not displayed]

FINDINGS: Two intraoperative fluoroscopic images demonstrate the patient be
status post surgical posterior fusion of L4-5 with bilateral
intrapedicular screw placement and interbody fusion.
IMPRESSION: Status post surgical posterior fusion of L4-5.

## 2020-06-23 DIAGNOSIS — H524 Presbyopia: Secondary | ICD-10-CM | POA: Diagnosis not present

## 2020-06-23 DIAGNOSIS — M79672 Pain in left foot: Secondary | ICD-10-CM | POA: Diagnosis not present

## 2020-06-23 DIAGNOSIS — H5212 Myopia, left eye: Secondary | ICD-10-CM | POA: Diagnosis not present

## 2020-06-23 DIAGNOSIS — H04123 Dry eye syndrome of bilateral lacrimal glands: Secondary | ICD-10-CM | POA: Diagnosis not present

## 2020-06-23 DIAGNOSIS — H25813 Combined forms of age-related cataract, bilateral: Secondary | ICD-10-CM | POA: Diagnosis not present

## 2020-06-23 DIAGNOSIS — H52203 Unspecified astigmatism, bilateral: Secondary | ICD-10-CM | POA: Diagnosis not present

## 2020-06-23 DIAGNOSIS — G609 Hereditary and idiopathic neuropathy, unspecified: Secondary | ICD-10-CM | POA: Diagnosis not present

## 2020-06-23 DIAGNOSIS — H40033 Anatomical narrow angle, bilateral: Secondary | ICD-10-CM | POA: Diagnosis not present

## 2020-06-23 DIAGNOSIS — M79671 Pain in right foot: Secondary | ICD-10-CM | POA: Diagnosis not present

## 2020-06-26 DIAGNOSIS — M7542 Impingement syndrome of left shoulder: Secondary | ICD-10-CM | POA: Diagnosis not present

## 2020-07-11 DIAGNOSIS — E669 Obesity, unspecified: Secondary | ICD-10-CM | POA: Diagnosis not present

## 2020-07-11 DIAGNOSIS — E781 Pure hyperglyceridemia: Secondary | ICD-10-CM | POA: Diagnosis not present

## 2020-07-11 DIAGNOSIS — I499 Cardiac arrhythmia, unspecified: Secondary | ICD-10-CM | POA: Diagnosis not present

## 2020-07-11 DIAGNOSIS — R42 Dizziness and giddiness: Secondary | ICD-10-CM | POA: Diagnosis not present

## 2020-07-30 ENCOUNTER — Encounter: Payer: Self-pay | Admitting: Cardiology

## 2020-07-30 ENCOUNTER — Ambulatory Visit (INDEPENDENT_AMBULATORY_CARE_PROVIDER_SITE_OTHER): Payer: Medicare Other | Admitting: Cardiology

## 2020-07-30 ENCOUNTER — Other Ambulatory Visit: Payer: Self-pay

## 2020-07-30 VITALS — BP 132/80 | HR 75 | Ht 71.0 in | Wt 269.8 lb

## 2020-07-30 DIAGNOSIS — R9431 Abnormal electrocardiogram [ECG] [EKG]: Secondary | ICD-10-CM | POA: Diagnosis not present

## 2020-07-30 DIAGNOSIS — Z7189 Other specified counseling: Secondary | ICD-10-CM | POA: Diagnosis not present

## 2020-07-30 DIAGNOSIS — Z87898 Personal history of other specified conditions: Secondary | ICD-10-CM | POA: Diagnosis not present

## 2020-07-30 NOTE — Patient Instructions (Addendum)
Medication Instructions:  Your Physician recommend you continue on your current medication as directed.    *If you need a refill on your cardiac medications before your next appointment, please call your pharmacy*   Lab Work: None   Testing/Procedures: None   Follow-Up: At [facility], you and your health needs are our priority.  As part of our continuing mission to provide you with exceptional heart care, we have created designated Provider Care Teams.  These Care Teams include your primary Cardiologist (physician) and Advanced Practice Providers (APPs -  Physician Assistants and Nurse Practitioners) who all work together to provide you with the care you need, when you need it.  We recommend signing up for the patient portal called "MyChart".  Sign up information is provided on this After Visit Summary.  MyChart is used to connect with patients for Virtual Visits (Telemedicine).  Patients are able to view lab/test results, encounter notes, upcoming appointments, etc.  Non-urgent messages can be sent to your provider as well.   To learn more about what you can do with MyChart, go to [email or web address].    Your next appointment:   As needed The format for your next appointment:   In Person  Provider:   Buford Dresser, MD   Other Instructions how to check blood pressure:  -sit comfortably in a chair, feet uncrossed and flat on floor, for 5-10 minutes  -arm ideally should rest at the level of the heart. However, arm should be relaxed and not tense (for example, do not hold the arm up unsupported)  -avoid exercise, caffeine, and tobacco for at least 30 minutes prior to BP reading  -don't take BP cuff reading over clothes (always place on skin directly)  -I prefer to know how well the medication is working, so I would like you to take your readings 1-2 hours after taking your blood pressure medication if possible  When you have an episode, try to catch it on your blood  pressure cuff or a pulse oximeter. If the readings are very fast, write the number down and then call the office for a follow up visit.  Can also consider a [employer].

## 2020-07-30 NOTE — Progress Notes (Signed)
Cardiology Office Note:    Date:  07/30/2020   ID:  Jeffrey [name], DOB 11-21-1945, MRN [id number]  PCP:  Marton Redwood, MD  Cardiologist:  Buford Dresser, MD  Referring MD: Geoffery Lyons, NP   No chief complaint on file.   History of Present Illness:    Jeffrey [name] is a 74 y.o. male with a hx of COPD, neuropathy who is seen as a new consult at the request of Geoffery Lyons, NP for the evaluation and management of arrhythmia.  Note from 07/11/20 with Claud Kelp, NP reviewed. Noted to have increased frequency of dizziness/lightheadedness, profuse sweating intermittently, now about every 6 weeks and lasting 30-60 min. ECG at that visit noted to be sinus arrhythmia. On my review of the copy of the ECG, this appears to be atrial bigeminy for the majority of the strip. Referred to cardiology for further evaluation.  Today:  Brings a copy of prior ECGs with him. 09/14/2018 Sinus with sinus arrhythmia vs. PACs 07/14/2009 Sinus bradycardia at 59 pm 07/10/2008 Sinus rhythm with sinus arrhythmia  Spells: lightheaded, no energy, sweating like he was running a race. Drenches his shirt. Hasn't checked vital signs during an event. Lasts 30-60 minutes, but he is able to work through it. Went away on its own, but feels drained after. No chest pain, no shortness of breath with these episodes. Does not feel his heart racing. No syncope with these. Denies dizziness or feeling like he is going to fall. Just feels like his energy is drained off.   Just got a blood pressure cuff, having trouble with variable readings. We discussed proper use/calibration of a cuff.  We discussed at length today the multiple potential etiologies of his symptoms. We discussed options for further evaluation, see below.  Denies chest pain, shortness of breath at rest or with normal exertion. No PND, orthopnea, LE edema or unexpected weight gain. No syncope or palpitations.  Past Medical History:  Diagnosis Date  .  Arthritis   . Difficulty sleeping   . EMPYEMA 10/01/2008  . Pneumonia   . RCT (rotator cuff tear)    LEFT    Past Surgical History:  Procedure Laterality Date  . BALLOON DILATION  09/01/2011   Procedure: BALLOON DILATION;  Surgeon: Landry Dyke, MD;  Location: [facility];  Service: Endoscopy;  Laterality: N/A;  . ESOPHAGOGASTRODUODENOSCOPY  09/01/2011   Procedure: ESOPHAGOGASTRODUODENOSCOPY (EGD);  Surgeon: Landry Dyke, MD;  Location: [facility];  Service: Endoscopy;  Laterality: N/A;  . lumbar disc surgery-2007    . LUNG SURGERY     REMOVAL OF EMPYEMA  . post neck surgery  1966  . post neck surgeryx2    . SHOULDER OPEN ROTATOR CUFF REPAIR Left 12/03/2015   Procedure: LEFT SHOULDER MINI OPEN ROTATOR CUFF REPAIR  ;  Surgeon: Susa Day, MD;  Location: [facility];  Service: Orthopedics;  Laterality: Left;  . thoracic sug. due to pneumonia  dec. 2010  . TRANSFORAMINAL LUMBAR INTERBODY FUSION (TLIF) WITH PEDICLE SCREW FIXATION 1 LEVEL N/A 12/07/2019   Procedure: Lumbar Four-Five Transforaminal lumbar interbody fusion;  Surgeon: Erline Levine, MD;  Location: [place];  Service: Neurosurgery;  Laterality: N/A;  Lumbar Four-Five Transforaminal lumbar interbody fusion    Current Medications: Current Outpatient Medications on File Prior to Visit  Medication Sig  . B Complex-C (B-COMPLEX WITH VITAMIN C) tablet Take 1 tablet by mouth in the morning and at bedtime.  . Cholecalciferol (VITAMIN D3 PO) Take 1 tablet by  mouth daily.  Marland Kitchen docusate sodium (COLACE) 100 MG capsule Take 1 capsule (100 mg total) by mouth 2 (two) times daily.  Marland Kitchen gabapentin (NEURONTIN) 300 MG capsule Take 300 mg by mouth 3 (three) times daily.  . methocarbamol (ROBAXIN) 500 MG tablet Take 1 tablet (500 mg total) by mouth every 6 (six) hours as needed for muscle spasms.  . Multiple Vitamin (MULTIVITAMIN WITH MINERALS) TABS tablet Take 1 tablet by mouth daily.   . polycarbophil (FIBERCON) 625 MG tablet Take 625 mg by mouth in  the morning and at bedtime.   . Probiotic Product (PROBIOTIC PO) Take 1 tablet by mouth daily.   No current facility-administered medications on file prior to visit.     Allergies:   Patient has no known allergies.   Social History   Tobacco Use  . Smoking status: Never Smoker  . Smokeless tobacco: Never Used  Vaping Use  . Vaping Use: Never used  Substance Use Topics  . Alcohol use: Yes    Comment: rarely  . Drug use: No    Family History: family history includes Cancer in his mother; Emphysema in his father; Lung disease in an other family member; Pulmonary fibrosis in his brother and mother. brother with TIA  ROS:   Please see the history of present illness.  Additional pertinent ROS: Constitutional: Negative for chills, fever, night sweats, unintentional weight loss. Positive for intermittent severe diaphoresis HENT: Negative for ear pain and hearing loss.   Eyes: Negative for loss of vision and eye pain.  Respiratory: Negative for cough, sputum, wheezing.   Cardiovascular: See HPI. Gastrointestinal: Negative for abdominal pain, melena, and hematochezia.  Genitourinary: Negative for dysuria and hematuria.  Musculoskeletal: Negative for falls and myalgias.  Skin: Negative for itching and rash.  Neurological: Negative for focal weakness, focal sensory changes and loss of consciousness.  Endo/Heme/Allergies: Does not bruise/bleed easily.     EKGs/Labs/Other Studies Reviewed:    The following studies were reviewed today: No prior cardiac studies  EKG:  EKG is personally reviewed.  The ekg ordered today demonstrates sinus rhythm with sinus arrhythmia at 75 bpm  Recent Labs: 12/04/2019: Hemoglobin 13.6; Platelets 266  Recent Lipid Panel No results found for: CHOL, TRIG, HDL, CHOLHDL, VLDL, LDLCALC, LDLDIRECT  Physical Exam:    VS:  BP 132/80   Pulse 75   Ht 5\' 11"  (1.803 m)   Wt 269 lb 12.8 oz (122.4 kg)   SpO2 95%   BMI 37.63 kg/m     Wt Readings from Last 3  Encounters:  07/30/20 269 lb 12.8 oz (122.4 kg)  12/07/19 272 lb 4.8 oz (123.5 kg)  12/04/19 272 lb 4.8 oz (123.5 kg)    GEN: Well nourished, well developed in no acute distress HEENT: Normal, moist mucous membranes NECK: No JVD CARDIAC: regular rhythm with occasional premature beat, normal S1 and S2, no rubs or gallops. No murmurs. VASCULAR: Radial and DP pulses 2+ bilaterally. No carotid bruits RESPIRATORY:  Clear to auscultation without rales, wheezing or rhonchi  ABDOMEN: Soft, non-tender, non-distended MUSCULOSKELETAL:  Ambulates independently SKIN: Warm and dry, no edema NEUROLOGIC:  Alert and oriented x 3. No focal neuro deficits noted. PSYCHIATRIC:  Normal affect    ASSESSMENT:    1. Nonspecific abnormal electrocardiogram (ECG) (EKG)   2. History of excessive sweating   3. Cardiac risk counseling   4. Counseling on health promotion and disease prevention    PLAN:    History of rare, intermittent excessive sweating with fatigue  Nonspecific abnormal ECG -we discussed his symptoms at length today. He has many appropriate questions about potential etiologies, workup, and treatment of his symptoms. -His symptoms are nonspecific and nonlimiting. No high risk features such as chest pain, shortness of breath, or syncope -his ECGs have shown sinus arrhythmia/PACs for many years.  -from a cardiac standpoint, the most common etiology would be an arrhythmia, and even this is low likelihood for him. We discussed monitors, kardiamobile, and monitoring heart rates. He and I both agreed that a Holter monitor is low yield given the infrequency of his events. We did discuss Kardiamobile, but he feels that the technology might be too much for him to master. We discussed monitoring for tachycardia with either his blood pressure cuff or the use of a pulse oximeter. He will try this. If his heart rate is not significantly elevated during these events, the likelihood of a serious arrhythmia is low.    -counseled on red flag warning signs that need immediate medical attention -discussed non-cardiac etiologies as well, such as autonomic neuropathy, endocrine, or other. His symptoms sound very similar to typical hot flashes, but no clear reason why he would have hot flashes.  Cardiac risk counseling and prevention recommendations: -recommend heart healthy/Mediterranean diet, with whole grains, fruits, vegetable, fish, lean meats, nuts, and olive oil. Limit salt. -recommend moderate walking, 3-5 times/week for 30-50 minutes each session. Aim for at least 150 minutes.week. Goal should be pace of 3 miles/hours, or walking 1.5 miles in 30 minutes -recommend avoidance of tobacco products. Avoid excess alcohol.  Plan for follow up: as needed  Buford Dresser, MD, PhD [place]  [facility]    Medication Adjustments/Labs and Tests Ordered: Current medicines are reviewed at length with the patient today.  Concerns regarding medicines are outlined above.  Orders Placed This Encounter  Procedures  . EKG 12-Lead   No orders of the defined types were placed in this encounter.   Patient Instructions  Medication Instructions:  Your Physician recommend you continue on your current medication as directed.    *If you need a refill on your cardiac medications before your next appointment, please call your pharmacy*   Lab Work: None   Testing/Procedures: None   Follow-Up: At [facility], you and your health needs are our priority.  As part of our continuing mission to provide you with exceptional heart care, we have created designated Provider Care Teams.  These Care Teams include your primary Cardiologist (physician) and Advanced Practice Providers (APPs -  Physician Assistants and Nurse Practitioners) who all work together to provide you with the care you need, when you need it.  We recommend signing up for the patient portal called "MyChart".  Sign up information is provided  on this After Visit Summary.  MyChart is used to connect with patients for Virtual Visits (Telemedicine).  Patients are able to view lab/test results, encounter notes, upcoming appointments, etc.  Non-urgent messages can be sent to your provider as well.   To learn more about what you can do with MyChart, go to [email or web address].    Your next appointment:   As needed The format for your next appointment:   In Person  Provider:   Buford Dresser, MD   Other Instructions how to check blood pressure:  -sit comfortably in a chair, feet uncrossed and flat on floor, for 5-10 minutes  -arm ideally should rest at the level of the heart. However, arm should be relaxed and not tense (for example, do not  hold the arm up unsupported)  -avoid exercise, caffeine, and tobacco for at least 30 minutes prior to BP reading  -don't take BP cuff reading over clothes (always place on skin directly)  -I prefer to know how well the medication is working, so I would like you to take your readings 1-2 hours after taking your blood pressure medication if possible  When you have an episode, try to catch it on your blood pressure cuff or a pulse oximeter. If the readings are very fast, write the number down and then call the office for a follow up visit.  Can also consider a [employer].   Signed, Buford Dresser, MD PhD 07/30/2020 2:14 PM    [facility]

## 2020-08-12 DIAGNOSIS — J309 Allergic rhinitis, unspecified: Secondary | ICD-10-CM | POA: Diagnosis not present

## 2020-08-12 DIAGNOSIS — J449 Chronic obstructive pulmonary disease, unspecified: Secondary | ICD-10-CM | POA: Diagnosis not present

## 2020-08-12 DIAGNOSIS — R059 Cough, unspecified: Secondary | ICD-10-CM | POA: Diagnosis not present

## 2020-09-16 DIAGNOSIS — E781 Pure hyperglyceridemia: Secondary | ICD-10-CM | POA: Diagnosis not present

## 2020-09-16 DIAGNOSIS — Z125 Encounter for screening for malignant neoplasm of prostate: Secondary | ICD-10-CM | POA: Diagnosis not present

## 2020-09-16 DIAGNOSIS — R7301 Impaired fasting glucose: Secondary | ICD-10-CM | POA: Diagnosis not present

## 2020-09-23 DIAGNOSIS — Z86010 Personal history of colonic polyps: Secondary | ICD-10-CM | POA: Diagnosis not present

## 2020-09-23 DIAGNOSIS — Z0000 Encounter for general adult medical examination without abnormal findings: Secondary | ICD-10-CM | POA: Diagnosis not present

## 2020-09-23 DIAGNOSIS — R42 Dizziness and giddiness: Secondary | ICD-10-CM | POA: Diagnosis not present

## 2020-09-23 DIAGNOSIS — R7301 Impaired fasting glucose: Secondary | ICD-10-CM | POA: Diagnosis not present

## 2020-09-23 DIAGNOSIS — J8410 Pulmonary fibrosis, unspecified: Secondary | ICD-10-CM | POA: Diagnosis not present

## 2020-09-23 DIAGNOSIS — J449 Chronic obstructive pulmonary disease, unspecified: Secondary | ICD-10-CM | POA: Diagnosis not present

## 2020-09-23 DIAGNOSIS — G629 Polyneuropathy, unspecified: Secondary | ICD-10-CM | POA: Diagnosis not present

## 2020-09-23 DIAGNOSIS — I499 Cardiac arrhythmia, unspecified: Secondary | ICD-10-CM | POA: Diagnosis not present

## 2020-09-23 DIAGNOSIS — Z1212 Encounter for screening for malignant neoplasm of rectum: Secondary | ICD-10-CM | POA: Diagnosis not present

## 2020-09-23 DIAGNOSIS — R82998 Other abnormal findings in urine: Secondary | ICD-10-CM | POA: Diagnosis not present

## 2020-11-11 DIAGNOSIS — M25512 Pain in left shoulder: Secondary | ICD-10-CM | POA: Diagnosis not present

## 2020-12-25 ENCOUNTER — Telehealth: Payer: Self-pay | Admitting: Cardiology

## 2020-12-25 NOTE — Telephone Encounter (Signed)
Patient would like to say with a physician at [facility]

## 2021-06-08 DIAGNOSIS — K589 Irritable bowel syndrome without diarrhea: Secondary | ICD-10-CM | POA: Diagnosis not present

## 2021-06-08 DIAGNOSIS — Z86010 Personal history of colonic polyps: Secondary | ICD-10-CM | POA: Diagnosis not present

## 2021-06-08 DIAGNOSIS — K639 Disease of intestine, unspecified: Secondary | ICD-10-CM | POA: Diagnosis not present

## 2021-06-19 DIAGNOSIS — Z86010 Personal history of colonic polyps: Secondary | ICD-10-CM | POA: Diagnosis not present

## 2021-06-19 DIAGNOSIS — K648 Other hemorrhoids: Secondary | ICD-10-CM | POA: Diagnosis not present

## 2021-06-19 DIAGNOSIS — D120 Benign neoplasm of cecum: Secondary | ICD-10-CM | POA: Diagnosis not present

## 2021-06-19 DIAGNOSIS — D123 Benign neoplasm of transverse colon: Secondary | ICD-10-CM | POA: Diagnosis not present

## 2021-06-19 DIAGNOSIS — D122 Benign neoplasm of ascending colon: Secondary | ICD-10-CM | POA: Diagnosis not present

## 2021-06-23 DIAGNOSIS — H179 Unspecified corneal scar and opacity: Secondary | ICD-10-CM | POA: Diagnosis not present

## 2021-06-23 DIAGNOSIS — H40033 Anatomical narrow angle, bilateral: Secondary | ICD-10-CM | POA: Diagnosis not present

## 2021-06-23 DIAGNOSIS — H25813 Combined forms of age-related cataract, bilateral: Secondary | ICD-10-CM | POA: Diagnosis not present

## 2021-06-23 DIAGNOSIS — D123 Benign neoplasm of transverse colon: Secondary | ICD-10-CM | POA: Diagnosis not present

## 2021-06-23 DIAGNOSIS — D122 Benign neoplasm of ascending colon: Secondary | ICD-10-CM | POA: Diagnosis not present

## 2021-06-23 DIAGNOSIS — D120 Benign neoplasm of cecum: Secondary | ICD-10-CM | POA: Diagnosis not present

## 2021-06-23 DIAGNOSIS — H04123 Dry eye syndrome of bilateral lacrimal glands: Secondary | ICD-10-CM | POA: Diagnosis not present

## 2021-06-26 DIAGNOSIS — Z23 Encounter for immunization: Secondary | ICD-10-CM | POA: Diagnosis not present

## 2021-07-08 DIAGNOSIS — Z23 Encounter for immunization: Secondary | ICD-10-CM | POA: Diagnosis not present

## 2021-09-11 DIAGNOSIS — H25812 Combined forms of age-related cataract, left eye: Secondary | ICD-10-CM | POA: Diagnosis not present

## 2021-09-23 DIAGNOSIS — H2511 Age-related nuclear cataract, right eye: Secondary | ICD-10-CM | POA: Diagnosis not present

## 2021-09-25 DIAGNOSIS — H25811 Combined forms of age-related cataract, right eye: Secondary | ICD-10-CM | POA: Diagnosis not present

## 2021-10-14 DIAGNOSIS — Z125 Encounter for screening for malignant neoplasm of prostate: Secondary | ICD-10-CM | POA: Diagnosis not present

## 2021-10-14 DIAGNOSIS — R7301 Impaired fasting glucose: Secondary | ICD-10-CM | POA: Diagnosis not present

## 2021-10-14 DIAGNOSIS — E781 Pure hyperglyceridemia: Secondary | ICD-10-CM | POA: Diagnosis not present

## 2021-10-14 DIAGNOSIS — R7989 Other specified abnormal findings of blood chemistry: Secondary | ICD-10-CM | POA: Diagnosis not present

## 2021-11-12 DIAGNOSIS — J449 Chronic obstructive pulmonary disease, unspecified: Secondary | ICD-10-CM | POA: Diagnosis not present

## 2021-11-12 DIAGNOSIS — Z86010 Personal history of colonic polyps: Secondary | ICD-10-CM | POA: Diagnosis not present

## 2021-11-12 DIAGNOSIS — Z1331 Encounter for screening for depression: Secondary | ICD-10-CM | POA: Diagnosis not present

## 2021-11-12 DIAGNOSIS — J8410 Pulmonary fibrosis, unspecified: Secondary | ICD-10-CM | POA: Diagnosis not present

## 2021-11-12 DIAGNOSIS — R82998 Other abnormal findings in urine: Secondary | ICD-10-CM | POA: Diagnosis not present

## 2021-11-12 DIAGNOSIS — K589 Irritable bowel syndrome without diarrhea: Secondary | ICD-10-CM | POA: Diagnosis not present

## 2021-11-12 DIAGNOSIS — R7301 Impaired fasting glucose: Secondary | ICD-10-CM | POA: Diagnosis not present

## 2021-11-12 DIAGNOSIS — Z1339 Encounter for screening examination for other mental health and behavioral disorders: Secondary | ICD-10-CM | POA: Diagnosis not present

## 2021-11-12 DIAGNOSIS — G629 Polyneuropathy, unspecified: Secondary | ICD-10-CM | POA: Diagnosis not present

## 2021-11-12 DIAGNOSIS — E781 Pure hyperglyceridemia: Secondary | ICD-10-CM | POA: Diagnosis not present

## 2021-11-12 DIAGNOSIS — Z0000 Encounter for general adult medical examination without abnormal findings: Secondary | ICD-10-CM | POA: Diagnosis not present

## 2021-12-14 DIAGNOSIS — K639 Disease of intestine, unspecified: Secondary | ICD-10-CM | POA: Diagnosis not present

## 2021-12-14 DIAGNOSIS — K589 Irritable bowel syndrome without diarrhea: Secondary | ICD-10-CM | POA: Diagnosis not present

## 2021-12-14 DIAGNOSIS — Z86010 Personal history of colonic polyps: Secondary | ICD-10-CM | POA: Diagnosis not present

## 2021-12-20 NOTE — Progress Notes (Signed)
? ?[facility] ? ?PATIENT: Jeffrey [name] ?DOB: Jan 04, 1946 ? ?REFERRING DOCTOR OR PCP: Marton Redwood, MD  ?SOURCE: Patient, notes from primary care, imaging and laboratory reports, MRI images personally reviewed. ? ?_________________________________ ? ? ?HISTORICAL ? ?CHIEF COMPLAINT:  ?Chief Complaint  ?Patient presents with  ? New Patient (Initial Visit)  ?  Rm 2, alone. Pt referred by Dr. Brigitte Pulse for neuropathy eval. Pt has had bilateral foot pn for over 15 years. Was originally told he had planter fascitis. 5 years ago pt was told he had neuropathy by orthopedic surgeon. Pt was having burning feeling on top/bottom of feet at that time. Was given gabapentin by orthopedic surgeon and had helped w burning on top of foot only. Feet are slowly getting number and now radiating upwards.   ? ? ?HISTORY OF PRESENT ILLNESS:  ?I had the pleasure of seeing your patient, Jeffrey [name], at [facility] for neurologic consultation regarding his neuropathy. ? ?He is a 76 year old man who has had pain and numbness in his feet x 15 years.  Symptoms aare sometimes right > left.    Initially he was diagnosed with pantar fasciitis and then polyneuropathy.    Pain started as more pins and needles sensation with some burning.   Now he feels an intense numbness.  Gabapentin helped the burning pain on the top of the feet but did not help the numbness.   He feels a heaviness in his feet, especially in a car, and he has more trouble feeling the pedals while driving.   He denies weakness.   He feels he needs to be more careful with his walking as he is more unbalanced, especially with turns.   Sometimes he feels he is not lifting the foot as high and he catches the floor.     ? ?He had lumbar surgery 12/07/2019 by Dr. Vertell Limber (L4-L5 and L5S2 decompression, with instrumentation and posterior fusion.  Pain in back and legs improved afterwards.  He still has some pain while standing but none while sitting.    ? ?He  is otherwise healthy.  Recent hemoglobin A1c was 5.3. ? ?Imaging: ?MRI of the lumbar spine 10/09/2019 shows mild facet hypertrophy at L3-L4 that does not lead to spinal stenosis or nerve root compression.  At L4-L5, there is trace anterolisthesis and right disc herniation causing moderately severe right foraminal and lateral recess stenosis with potential for right L4 and L5 nerve root compression.  There is also moderate left foraminal and lateral recess stenosis though there does not appear to be nerve root compression on that side.  At L5-S1, there is l bilateral facet hypertrophy causing mild foraminal and lateral recess stenosis but no nerve root compression. ? ?REVIEW OF SYSTEMS: ?Constitutional: No fevers, chills, sweats, or change in appetite ?Eyes: No visual changes, double vision, eye pain ?Ear, nose and throat: No hearing loss, ear pain, nasal congestion, sore throat ?Cardiovascular: No chest pain, palpitations ?Respiratory:  No shortness of breath at rest or with exertion.   No wheezes ?GastrointestinaI: No nausea, vomiting, diarrhea, abdominal pain, fecal incontinence ?Genitourinary:  No dysuria, urinary retention or frequency.  No nocturia. ?Musculoskeletal:  No neck pain, back pain ?Integumentary: No rash, pruritus, skin lesions ?Neurological: as above ?Psychiatric: No depression at this time.  No anxiety ?Endocrine: No palpitations, diaphoresis, change in appetite, change in weigh or increased thirst ?Hematologic/Lymphatic:  No anemia, purpura, petechiae. ?Allergic/Immunologic: No itchy/runny eyes, nasal congestion, recent allergic reactions, rashes ? ?ALLERGIES: ?No Known  Allergies ? ?HOME MEDICATIONS: ? ?Current Outpatient Medications:  ?  B Complex-C (B-COMPLEX WITH VITAMIN C) tablet, Take 1 tablet by mouth in the morning and at bedtime., Disp: , Rfl:  ?  Cholecalciferol (VITAMIN D3 PO), Take 1 tablet by mouth daily., Disp: , Rfl:  ?  docusate sodium (COLACE) 100 MG capsule, Take 1 capsule (100 mg  total) by mouth 2 (two) times daily., Disp: 60 capsule, Rfl: 0 ?  gabapentin (NEURONTIN) 300 MG capsule, Take 300 mg by mouth 3 (three) times daily., Disp: , Rfl:  ?  methocarbamol (ROBAXIN) 500 MG tablet, Take 1 tablet (500 mg total) by mouth every 6 (six) hours as needed for muscle spasms., Disp: 50 tablet, Rfl: 0 ?  Multiple Vitamin (MULTIVITAMIN WITH MINERALS) TABS tablet, Take 1 tablet by mouth daily. , Disp: , Rfl:  ?  Peppermint Oil (IBGARD PO), Take 1 tablet by mouth 2 times daily at 12 noon and 4 pm., Disp: , Rfl:  ?  polycarbophil (FIBERCON) 625 MG tablet, Take 625 mg by mouth in the morning and at bedtime. , Disp: , Rfl:  ?  Probiotic Product (PROBIOTIC PO), Take 1 tablet by mouth daily., Disp: , Rfl:  ? ?PAST MEDICAL HISTORY: ?Past Medical History:  ?Diagnosis Date  ? Arthritis   ? Difficulty sleeping   ? EMPYEMA 10/01/2008  ? Pneumonia   ? RCT (rotator cuff tear)   ? LEFT  ? ? ?PAST SURGICAL HISTORY: ?Past Surgical History:  ?Procedure Laterality Date  ? BALLOON DILATION  09/01/2011  ? Procedure: BALLOON DILATION;  Surgeon: Landry Dyke, MD;  Location: [facility];  Service: Endoscopy;  Laterality: N/A;  ? ESOPHAGOGASTRODUODENOSCOPY  09/01/2011  ? Procedure: ESOPHAGOGASTRODUODENOSCOPY (EGD);  Surgeon: Landry Dyke, MD;  Location: [facility];  Service: Endoscopy;  Laterality: N/A;  ? lumbar disc surgery-2007    ? LUNG SURGERY    ? REMOVAL OF EMPYEMA  ? post neck surgery  1966  ? post neck surgeryx2    ? SHOULDER OPEN ROTATOR CUFF REPAIR Left 12/03/2015  ? Procedure: LEFT SHOULDER MINI OPEN ROTATOR CUFF REPAIR  ;  Surgeon: Susa Day, MD;  Location: [facility];  Service: Orthopedics;  Laterality: Left;  ? thoracic sug. due to pneumonia  dec. 2010  ? TRANSFORAMINAL LUMBAR INTERBODY FUSION (TLIF) WITH PEDICLE SCREW FIXATION 1 LEVEL N/A 12/07/2019  ? Procedure: Lumbar Four-Five Transforaminal lumbar interbody fusion;  Surgeon: Erline Levine, MD;  Location: [place];  Service: Neurosurgery;  Laterality: N/A;   Lumbar Four-Five Transforaminal lumbar interbody fusion  ? ? ?FAMILY HISTORY: ?Family History  ?Problem Relation Age of Onset  ? Cancer Mother   ? Pulmonary fibrosis Mother   ? Emphysema Father   ? Pulmonary fibrosis Brother   ? Lung disease Other   ? ? ?SOCIAL HISTORY: ? ?Social History  ? ?Socioeconomic History  ? Marital status: Married  ?  Spouse name: nancy  ? Number of children: 2  ? Years of education: Not on file  ? Highest education level: Master's degree (e.g., MA, MS, MEng, MEd, MSW, MBA)  ?Occupational History  ? Occupation: teaches in driving school  ?Tobacco Use  ? Smoking status: Never  ? Smokeless tobacco: Never  ?Vaping Use  ? Vaping Use: Never used  ?Substance and Sexual Activity  ? Alcohol use: Not on file  ? Drug use: No  ? Sexual activity: Yes  ?Other Topics Concern  ? Not on file  ?Social History Narrative  ? Lives at home w  wife  ? R handed  ? Caffeine: 2 pepsi cans a day  ? ?Social Determinants of Health  ? ?Financial Resource Strain: Not on file  ?Food Insecurity: Not on file  ?Transportation Needs: Not on file  ?Physical Activity: Not on file  ?Stress: Not on file  ?Social Connections: Not on file  ?Intimate Partner Violence: Not on file  ? ? ? ?PHYSICAL EXAM ? ?Vitals:  ? 12/21/21 1004  ?BP: (!) 144/82  ?Pulse: 76  ?Weight: 276 lb 8 oz (125.4 kg)  ?Height: '5\' 11"'$  (1.803 m)  ? ? ?Body mass index is 38.56 kg/m?. ? ? ?General: The patient is well-developed and well-nourished and in no acute distress ? ?HEENT:  Head is [place]/AT.  Sclera are anicteric.  Funduscopic exam shows normal optic discs and retinal vessels. ? ?Neck: No carotid bruits are noted.  The neck is nontender. ? ?Cardiovascular: The heart has a regular rate and rhythm with a normal S1 and S2. There were no murmurs, gallops or rubs.   ? ?Skin: Extremities are without rash or  edema. ? ?Musculoskeletal:  Back is nontender ? ?Neurologic Exam ? ?Mental status: The patient is alert and oriented x 3 at the time of the examination. The  patient has apparent normal recent and remote memory, with an apparently normal attention span and concentration ability.   Speech is normal. ? ?Cranial nerves: Extraocular movements are full.  There is g

## 2021-12-21 ENCOUNTER — Ambulatory Visit (INDEPENDENT_AMBULATORY_CARE_PROVIDER_SITE_OTHER): Payer: Medicare Other | Admitting: Neurology

## 2021-12-21 ENCOUNTER — Encounter: Payer: Self-pay | Admitting: Neurology

## 2021-12-21 VITALS — BP 144/82 | HR 76 | Ht 71.0 in | Wt 276.5 lb

## 2021-12-21 DIAGNOSIS — R200 Anesthesia of skin: Secondary | ICD-10-CM | POA: Diagnosis not present

## 2021-12-21 DIAGNOSIS — G629 Polyneuropathy, unspecified: Secondary | ICD-10-CM | POA: Insufficient documentation

## 2021-12-21 DIAGNOSIS — Z981 Arthrodesis status: Secondary | ICD-10-CM | POA: Diagnosis not present

## 2021-12-21 DIAGNOSIS — R269 Unspecified abnormalities of gait and mobility: Secondary | ICD-10-CM | POA: Insufficient documentation

## 2021-12-22 ENCOUNTER — Telehealth: Payer: Self-pay | Admitting: Neurology

## 2021-12-22 NOTE — Telephone Encounter (Signed)
NCV/EMG sent to [facility] [phone]. ?

## 2021-12-23 LAB — MULTIPLE MYELOMA PANEL, SERUM
Albumin SerPl Elph-Mcnc: 3.8 g/dL (ref 2.9–4.4)
Albumin/Glob SerPl: 1.4 (ref 0.7–1.7)
Alpha 1: 0.2 g/dL (ref 0.0–0.4)
Alpha2 Glob SerPl Elph-Mcnc: 0.7 g/dL (ref 0.4–1.0)
B-Globulin SerPl Elph-Mcnc: 0.9 g/dL (ref 0.7–1.3)
Gamma Glob SerPl Elph-Mcnc: 1.1 g/dL (ref 0.4–1.8)
Globulin, Total: 2.8 g/dL (ref 2.2–3.9)
IgA/Immunoglobulin A, Serum: 124 mg/dL (ref 61–437)
IgG (Immunoglobin G), Serum: 1127 mg/dL (ref 603–1613)
IgM (Immunoglobulin M), Srm: 24 mg/dL (ref 15–143)
Total Protein: 6.6 g/dL (ref 6.0–8.5)

## 2021-12-23 LAB — SJOGREN'S SYNDROME ANTIBODS(SSA + SSB)
ENA SSA (RO) Ab: 0.3 AI (ref 0.0–0.9)
ENA SSB (LA) Ab: <0.2 AI (ref 0.0–0.9)

## 2021-12-23 LAB — VITAMIN B12: Vitamin B-12: 738 pg/mL (ref 232–1245)

## 2021-12-23 LAB — COPPER, SERUM: Copper: 111 ug/dL (ref 69–132)

## 2021-12-23 LAB — RHEUMATOID FACTOR: Rheumatoid fact SerPl-aCnc: <10 IU/mL (ref <14.0)

## 2022-01-18 DIAGNOSIS — G6289 Other specified polyneuropathies: Secondary | ICD-10-CM | POA: Diagnosis not present

## 2022-04-26 ENCOUNTER — Ambulatory Visit (INDEPENDENT_AMBULATORY_CARE_PROVIDER_SITE_OTHER): Payer: Medicare Other | Admitting: Neurology

## 2022-04-26 ENCOUNTER — Encounter: Payer: Self-pay | Admitting: Neurology

## 2022-04-26 VITALS — BP 151/83 | HR 81 | Ht 71.0 in | Wt 276.0 lb

## 2022-04-26 DIAGNOSIS — Z981 Arthrodesis status: Secondary | ICD-10-CM | POA: Diagnosis not present

## 2022-04-26 DIAGNOSIS — G629 Polyneuropathy, unspecified: Secondary | ICD-10-CM | POA: Diagnosis not present

## 2022-04-26 DIAGNOSIS — R200 Anesthesia of skin: Secondary | ICD-10-CM

## 2022-04-26 DIAGNOSIS — R269 Unspecified abnormalities of gait and mobility: Secondary | ICD-10-CM | POA: Diagnosis not present

## 2022-04-26 MED ORDER — GABAPENTIN 300 MG PO CAPS: ORAL_CAPSULE | ORAL | 4 refills | Status: DC

## 2022-04-26 NOTE — Progress Notes (Signed)
[facility]  PATIENT: Jeffrey [name] DOB: 09/13/1945  REFERRING DOCTOR OR PCP: Marton Redwood, MD  SOURCE: Patient, notes from primary [name], imaging and laboratory reports, MRI images personally reviewed.  _________________________________   HISTORICAL  CHIEF COMPLAINT:  Chief Complaint  Patient presents with   Follow-up    Pt alone, rm 1. Slowly feels like things are worsening. He did complete the NCV/EMG testing through [facility].     HISTORY OF PRESENT ILLNESS:  Jeffrey [name], at [facility] for neurologic consultation regarding his neuropathy.  He has more numbness ad pain in his legs   He has pain in the bottom of the feet and altered sensation in his right leg > left leg up to mid shin.   Pain is sharp, not really a tingling.    It fluctuates but is always present.   Pain does worse when he walks.  Due to his LB issues he does not walk far in general.  Pain is usually mild to moderate.   He is on gabapentin x 6 years.  It did help initially when he started and he feels it still helps.     Polyneuropathy history and data: He has had pain and numbness in the feet since around 2005.  Symptoms are usually right > left.    Initially he was diagnosed with plantar fasciitis and then polyneuropathy.   He had lumbar surgery 12/07/2019 by Dr. Vertell Limber (L4-L5 and L5S2 decompression, with instrumentation and posterior fusion.  Pain in back and legs improved afterwards.  He still has some pain while standing but none while sitting.     He is otherwise healthy.  Recent hemoglobin A1c was 5.3.   NCV/EMG 12/2021:  Electrodiagnostic evidence of a predominantly axonal motor peripheral neuropathy, involving the bilateral common peroneal and tibial motor nerves, with secondary demyelinating features noted.    No electrodiagnostic evidence of a peroneal motor entrapment neuropathy at the level of bilateral fibular heads.   No electrodiagnostic evidence of a sural  sensory peripheral neuropathy in the bilateral lower extremities.   Labs: 12/21/2021: SPEP, B12, SSA/SSB, rheumatoid factor and copper were normal or negative  Imaging: MRI of the lumbar spine 10/09/2019 shows mild facet hypertrophy at L3-L4 that does not lead to spinal stenosis or nerve root compression.  At L4-L5, there is trace anterolisthesis and right disc herniation causing moderately severe right foraminal and lateral recess stenosis with potential for right L4 and L5 nerve root compression.  There is also moderate left foraminal and lateral recess stenosis though there does not appear to be nerve root compression on that side.  At L5-S1, there is l bilateral facet hypertrophy causing mild foraminal and lateral recess stenosis but no nerve root compression.  REVIEW OF SYSTEMS: Constitutional: No fevers, chills, sweats, or change in appetite Eyes: No visual changes, double vision, eye pain Ear, nose and throat: No hearing loss, ear pain, nasal congestion, sore throat Cardiovascular: No chest pain, palpitations Respiratory:  No shortness of breath at rest or with exertion.   No wheezes GastrointestinaI: No nausea, vomiting, diarrhea, abdominal pain, fecal incontinence Genitourinary:  No dysuria, urinary retention or frequency.  No nocturia. Musculoskeletal:  No neck pain, back pain Integumentary: No rash, pruritus, skin lesions Neurological: as above Psychiatric: No depression at this time.  No anxiety Endocrine: No palpitations, diaphoresis, change in appetite, change in weigh or increased thirst Hematologic/Lymphatic:  No anemia, purpura, petechiae. Allergic/Immunologic: No itchy/runny eyes, nasal congestion, recent allergic reactions, rashes  ALLERGIES: No Known Allergies  HOME MEDICATIONS:  Current Outpatient Medications:    B Complex-C (B-COMPLEX WITH VITAMIN C) tablet, Take 1 tablet by mouth in the morning and at bedtime., Disp: , Rfl:    Cholecalciferol (VITAMIN D3 PO), Take 1  tablet by mouth daily., Disp: , Rfl:    docusate sodium (COLACE) 100 MG capsule, Take 1 capsule (100 mg total) by mouth 2 (two) times daily., Disp: 60 capsule, Rfl: 0   methocarbamol (ROBAXIN) 500 MG tablet, Take 1 tablet (500 mg total) by mouth every 6 (six) hours as needed for muscle spasms., Disp: 50 tablet, Rfl: 0   Multiple Vitamin (MULTIVITAMIN WITH MINERALS) TABS tablet, Take 1 tablet by mouth daily. , Disp: , Rfl:    polycarbophil (FIBERCON) 625 MG tablet, Take 625 mg by mouth in the morning and at bedtime. , Disp: , Rfl:    Probiotic Product (PROBIOTIC PO), Take 1 tablet by mouth daily., Disp: , Rfl:    gabapentin (NEURONTIN) 300 MG capsule, One po qAM, one po qPM and to po qHS, Disp: 360 capsule, Rfl: 4  PAST MEDICAL HISTORY: Past Medical History:  Diagnosis Date   Arthritis    Difficulty sleeping    EMPYEMA 10/01/2008   Pneumonia    RCT (rotator cuff tear)    LEFT    PAST SURGICAL HISTORY: Past Surgical History:  Procedure Laterality Date   BALLOON DILATION  09/01/2011   Procedure: BALLOON DILATION;  Surgeon: Landry Dyke, MD;  Location: [facility];  Service: Endoscopy;  Laterality: N/A;   ESOPHAGOGASTRODUODENOSCOPY  09/01/2011   Procedure: ESOPHAGOGASTRODUODENOSCOPY (EGD);  Surgeon: Landry Dyke, MD;  Location: [facility];  Service: Endoscopy;  Laterality: N/A;   lumbar disc surgery-2007     LUNG SURGERY     REMOVAL OF EMPYEMA   post neck surgery  1966   post neck surgeryx2     SHOULDER OPEN ROTATOR CUFF REPAIR Left 12/03/2015   Procedure: LEFT SHOULDER MINI OPEN ROTATOR CUFF REPAIR  ;  Surgeon: Susa Day, MD;  Location: [facility];  Service: Orthopedics;  Laterality: Left;   thoracic sug. due to pneumonia  dec. 2010   TRANSFORAMINAL LUMBAR INTERBODY FUSION (TLIF) WITH PEDICLE SCREW FIXATION 1 LEVEL N/A 12/07/2019   Procedure: Lumbar Four-Five Transforaminal lumbar interbody fusion;  Surgeon: Erline Levine, MD;  Location: [facility];  Service: Neurosurgery;  Laterality:  N/A;  Lumbar Four-Five Transforaminal lumbar interbody fusion    FAMILY HISTORY: Family History  Problem Relation Age of Onset   Cancer Mother    Pulmonary fibrosis Mother    Emphysema Father    Pulmonary fibrosis Brother    Lung disease Other     SOCIAL HISTORY:  Social History   Socioeconomic History   Marital status: Married    Spouse name: nancy   Number of children: 2   Years of education: Not on file   Highest education level: Master's degree (e.g., MA, MS, MEng, MEd, MSW, MBA)  Occupational History   Occupation: teaches in driving school  Tobacco Use   Smoking status: Never   Smokeless tobacco: Never  Vaping Use   Vaping Use: Never used  Substance and Sexual Activity   Alcohol use: Not on file   Drug use: No   Sexual activity: Yes  Other Topics Concern   Not on file  Social History Narrative   Lives at home w wife   R handed   Caffeine: 2 pepsi cans a day   Social Determinants of Health  Financial Resource Strain: Not on file  Food Insecurity: Not on file  Transportation Needs: Not on file  Physical Activity: Not on file  Stress: Not on file  Social Connections: Not on file  Intimate Partner Violence: Not on file     PHYSICAL EXAM  Vitals:   04/26/22 1012  BP: (!) 151/83  Pulse: 81  Weight: 276 lb (125.2 kg)  Height: 5' 11"  (1.803 m)    Body mass index is 38.49 kg/m.   General: The patient is well-developed and well-nourished and in no acute distress  HEENT:  Head is [place]/AT.  Sclera are anicteric.  Funduscopic exam shows normal optic discs and retinal vessels.  Neck: No carotid bruits are noted.  The neck is nontender.  Cardiovascular: The heart has a regular rate and rhythm with a normal S1 and S2. There were no murmurs, gallops or rubs.    Skin: Extremities are without rash or  edema.  Musculoskeletal:  Back is nontender  Neurologic Exam  Mental status: The patient is alert and oriented x 3 at the time of the examination. The  patient has apparent normal recent and remote memory, with an apparently normal attention span and concentration ability.   Speech is normal.  Cranial nerves: Extraocular movements are full.  There is good facial sensation to soft touch bilaterally.Facial strength is normal.  Trapezius and sternocleidomastoid strength is normal. No dysarthria is noted.  The tongue is midline, and the patient has symmetric elevation of the soft palate. No obvious hearing deficits are noted.  Motor:  Muscle bulk is normal.   Tone is normal. Strength is  5 / 5 in all 4 extremities except 4+/5 EHL muscles bilat. .   Sensory: Sensory testing is intact to pinprick, soft touch and vibration sensation in arms but reduced vibration sensation at ankles and absent at toes.   Reduced pinprick at toes normal by mid-foot.   .  Coordination: Cerebellar testing reveals good finger-nose-finger and heel-to-shin bilaterally.  Gait and station: Station is normal.   Gait is mildly shuffling and he cannot do a tandem walk.  . Romberg is negative.   Reflexes: Deep tendon reflexes are symmetric and normal in arms and knees and trace at ankles..   Plantar responses are flexor.    DIAGNOSTIC DATA (LABS, IMAGING, TESTING) - I reviewed patient records, labs, notes, testing and imaging myself where available.  Lab Results  Component Value Date   WBC 7.2 12/04/2019   HGB 13.6 12/04/2019   HCT 41.8 12/04/2019   MCV 95.2 12/04/2019   PLT 266 12/04/2019      Component Value Date/Time   NA 131 (L) 08/21/2008 0430   K 4.3 08/21/2008 0430   CL 97 08/21/2008 0430   CO2 26 08/21/2008 0430   GLUCOSE 104 (H) 08/21/2008 0430   BUN 15 08/21/2008 0430   CREATININE 0.79 08/21/2008 0430   CALCIUM 8.1 (L) 08/21/2008 0430   PROT 6.6 12/21/2021 1105   ALBUMIN 1.3 (L) 08/17/2008 0400   AST 21 08/17/2008 0400   ALT 22 08/17/2008 0400   ALKPHOS 38 (L) 08/17/2008 0400   BILITOT 0.4 08/17/2008 0400   GFRNONAA >60 08/21/2008 0430   GFRAA   08/21/2008 0430    >60        The eGFR has been calculated using the MDRD equation. This calculation has not been validated in all clinical   No results found for: "CHOL", "HDL", "LDLCALC", "LDLDIRECT", "TRIG", "CHOLHDL" Lab Results  Component Value Date  HGBA1C  08/12/2008    5.5 (NOTE)   The ADA recommends the following therapeutic goal for glycemic   control related to Hgb A1C measurement:   Goal of Therapy:   < 7.0% Hgb A1C   Reference: American Diabetes Association: Clinical Practice   Recommendations 2008, Diabetes [name],  2008, 31:(Suppl 1).   Lab Results  Component Value Date   [id number] 127 12/21/2021   No results found for: "TSH"     ASSESSMENT AND PLAN  Polyneuropathy  History of fusion of lumbar spine  Gait disturbance  Numbness  NCV/EMG a few months ago showed length dependent polyneuropathy.  This appears to be large fiber more than small fiber on examination.  Lab work was normal. Increase gabapentin to 300 mg in the morning, 300 mg in the afternoon and 600 mg at night.  If he tolerates the dose, this can be increased further. Stay active and exercise as tolerated. We discussed safety especially in the dark or when his eyes are closed (as in shower) Return in 12 months or sooner if there are new or worsening neurologic symptoms.   Jeffrey Trang A. Felecia Shelling, MD, [facility] 8/71/8367, 2:55 PM Certified in Neurology, Clinical Neurophysiology, Sleep Medicine and Neuroimaging  [facility] [address] [place],  [address] [phone]

## 2022-04-26 NOTE — Patient Instructions (Signed)
Take alpha lipoic acid 600 mg twice a day

## 2022-04-27 DIAGNOSIS — H179 Unspecified corneal scar and opacity: Secondary | ICD-10-CM | POA: Diagnosis not present

## 2022-04-27 DIAGNOSIS — Z961 Presence of intraocular lens: Secondary | ICD-10-CM | POA: Diagnosis not present

## 2022-04-27 DIAGNOSIS — H04123 Dry eye syndrome of bilateral lacrimal glands: Secondary | ICD-10-CM | POA: Diagnosis not present

## 2022-09-06 DIAGNOSIS — R051 Acute cough: Secondary | ICD-10-CM | POA: Diagnosis not present

## 2022-09-06 DIAGNOSIS — J8410 Pulmonary fibrosis, unspecified: Secondary | ICD-10-CM | POA: Diagnosis not present

## 2022-09-06 DIAGNOSIS — J449 Chronic obstructive pulmonary disease, unspecified: Secondary | ICD-10-CM | POA: Diagnosis not present

## 2022-11-25 DIAGNOSIS — Z125 Encounter for screening for malignant neoplasm of prostate: Secondary | ICD-10-CM | POA: Diagnosis not present

## 2022-11-25 DIAGNOSIS — J309 Allergic rhinitis, unspecified: Secondary | ICD-10-CM | POA: Diagnosis not present

## 2022-11-25 DIAGNOSIS — Z86010 Personal history of colonic polyps: Secondary | ICD-10-CM | POA: Diagnosis not present

## 2022-11-25 DIAGNOSIS — E781 Pure hyperglyceridemia: Secondary | ICD-10-CM | POA: Diagnosis not present

## 2022-11-25 DIAGNOSIS — D649 Anemia, unspecified: Secondary | ICD-10-CM | POA: Diagnosis not present

## 2022-11-25 DIAGNOSIS — R7301 Impaired fasting glucose: Secondary | ICD-10-CM | POA: Diagnosis not present

## 2022-11-25 DIAGNOSIS — M5416 Radiculopathy, lumbar region: Secondary | ICD-10-CM | POA: Diagnosis not present

## 2022-11-25 DIAGNOSIS — J449 Chronic obstructive pulmonary disease, unspecified: Secondary | ICD-10-CM | POA: Diagnosis not present

## 2022-11-25 DIAGNOSIS — G629 Polyneuropathy, unspecified: Secondary | ICD-10-CM | POA: Diagnosis not present

## 2022-11-25 DIAGNOSIS — M542 Cervicalgia: Secondary | ICD-10-CM | POA: Diagnosis not present

## 2022-11-25 DIAGNOSIS — R42 Dizziness and giddiness: Secondary | ICD-10-CM | POA: Diagnosis not present

## 2022-11-25 DIAGNOSIS — K589 Irritable bowel syndrome without diarrhea: Secondary | ICD-10-CM | POA: Diagnosis not present

## 2022-11-25 DIAGNOSIS — J8410 Pulmonary fibrosis, unspecified: Secondary | ICD-10-CM | POA: Diagnosis not present

## 2022-12-03 DIAGNOSIS — Z0000 Encounter for general adult medical examination without abnormal findings: Secondary | ICD-10-CM | POA: Diagnosis not present

## 2022-12-03 DIAGNOSIS — J8410 Pulmonary fibrosis, unspecified: Secondary | ICD-10-CM | POA: Diagnosis not present

## 2022-12-03 DIAGNOSIS — K589 Irritable bowel syndrome without diarrhea: Secondary | ICD-10-CM | POA: Diagnosis not present

## 2022-12-03 DIAGNOSIS — E781 Pure hyperglyceridemia: Secondary | ICD-10-CM | POA: Diagnosis not present

## 2022-12-03 DIAGNOSIS — G629 Polyneuropathy, unspecified: Secondary | ICD-10-CM | POA: Diagnosis not present

## 2022-12-03 DIAGNOSIS — R7301 Impaired fasting glucose: Secondary | ICD-10-CM | POA: Diagnosis not present

## 2022-12-03 DIAGNOSIS — J449 Chronic obstructive pulmonary disease, unspecified: Secondary | ICD-10-CM | POA: Diagnosis not present

## 2022-12-03 DIAGNOSIS — Z1339 Encounter for screening examination for other mental health and behavioral disorders: Secondary | ICD-10-CM | POA: Diagnosis not present

## 2022-12-03 DIAGNOSIS — Z86010 Personal history of colonic polyps: Secondary | ICD-10-CM | POA: Diagnosis not present

## 2022-12-03 DIAGNOSIS — Z23 Encounter for immunization: Secondary | ICD-10-CM | POA: Diagnosis not present

## 2022-12-03 DIAGNOSIS — Z1331 Encounter for screening for depression: Secondary | ICD-10-CM | POA: Diagnosis not present

## 2022-12-03 DIAGNOSIS — R82998 Other abnormal findings in urine: Secondary | ICD-10-CM | POA: Diagnosis not present

## 2023-01-07 ENCOUNTER — Emergency Department (HOSPITAL_COMMUNITY): Payer: Medicare Other

## 2023-01-07 ENCOUNTER — Inpatient Hospital Stay (HOSPITAL_COMMUNITY)
Admission: EM | Admit: 2023-01-07 | Discharge: 2023-01-14 | DRG: 193 | Disposition: A | Discharge status: Home Health | Payer: Medicare Other | Attending: Family Medicine | Admitting: Family Medicine

## 2023-01-07 ENCOUNTER — Other Ambulatory Visit: Payer: Self-pay

## 2023-01-07 DIAGNOSIS — Z6838 Body mass index (BMI) 38.0-38.9, adult: Secondary | ICD-10-CM | POA: Diagnosis not present

## 2023-01-07 DIAGNOSIS — J8410 Pulmonary fibrosis, unspecified: Secondary | ICD-10-CM | POA: Diagnosis not present

## 2023-01-07 DIAGNOSIS — Z79899 Other long term (current) drug therapy: Secondary | ICD-10-CM | POA: Diagnosis not present

## 2023-01-07 DIAGNOSIS — G629 Polyneuropathy, unspecified: Secondary | ICD-10-CM

## 2023-01-07 DIAGNOSIS — E6609 Other obesity due to excess calories: Secondary | ICD-10-CM

## 2023-01-07 DIAGNOSIS — J189 Pneumonia, unspecified organism: Secondary | ICD-10-CM | POA: Insufficient documentation

## 2023-01-07 DIAGNOSIS — G4700 Insomnia, unspecified: Secondary | ICD-10-CM | POA: Diagnosis not present

## 2023-01-07 DIAGNOSIS — Z825 Family history of asthma and other chronic lower respiratory diseases: Secondary | ICD-10-CM

## 2023-01-07 DIAGNOSIS — R509 Fever, unspecified: Secondary | ICD-10-CM | POA: Diagnosis not present

## 2023-01-07 DIAGNOSIS — E66812 Obesity, class 2: Secondary | ICD-10-CM

## 2023-01-07 DIAGNOSIS — J9601 Acute respiratory failure with hypoxia: Secondary | ICD-10-CM | POA: Diagnosis present

## 2023-01-07 DIAGNOSIS — R030 Elevated blood-pressure reading, without diagnosis of hypertension: Secondary | ICD-10-CM | POA: Diagnosis not present

## 2023-01-07 DIAGNOSIS — R069 Unspecified abnormalities of breathing: Secondary | ICD-10-CM | POA: Diagnosis not present

## 2023-01-07 DIAGNOSIS — Z713 Dietary counseling and surveillance: Secondary | ICD-10-CM

## 2023-01-07 DIAGNOSIS — I10 Essential (primary) hypertension: Secondary | ICD-10-CM | POA: Diagnosis not present

## 2023-01-07 DIAGNOSIS — Z1152 Encounter for screening for COVID-19: Secondary | ICD-10-CM

## 2023-01-07 DIAGNOSIS — J9690 Respiratory failure, unspecified, unspecified whether with hypoxia or hypercapnia: Secondary | ICD-10-CM | POA: Diagnosis not present

## 2023-01-07 DIAGNOSIS — Z8701 Personal history of pneumonia (recurrent): Secondary | ICD-10-CM | POA: Diagnosis not present

## 2023-01-07 DIAGNOSIS — R0602 Shortness of breath: Secondary | ICD-10-CM | POA: Diagnosis not present

## 2023-01-07 DIAGNOSIS — R040 Epistaxis: Secondary | ICD-10-CM | POA: Diagnosis not present

## 2023-01-07 DIAGNOSIS — R5381 Other malaise: Secondary | ICD-10-CM | POA: Diagnosis present

## 2023-01-07 DIAGNOSIS — J449 Chronic obstructive pulmonary disease, unspecified: Secondary | ICD-10-CM | POA: Diagnosis not present

## 2023-01-07 DIAGNOSIS — J9811 Atelectasis: Secondary | ICD-10-CM | POA: Diagnosis not present

## 2023-01-07 DIAGNOSIS — J13 Pneumonia due to Streptococcus pneumoniae: Principal | ICD-10-CM | POA: Diagnosis present

## 2023-01-07 DIAGNOSIS — M1990 Unspecified osteoarthritis, unspecified site: Secondary | ICD-10-CM | POA: Diagnosis present

## 2023-01-07 DIAGNOSIS — Z981 Arthrodesis status: Secondary | ICD-10-CM

## 2023-01-07 DIAGNOSIS — D649 Anemia, unspecified: Secondary | ICD-10-CM | POA: Diagnosis not present

## 2023-01-07 DIAGNOSIS — R000 Tachycardia, unspecified: Secondary | ICD-10-CM | POA: Diagnosis not present

## 2023-01-07 DIAGNOSIS — R0902 Hypoxemia: Secondary | ICD-10-CM | POA: Diagnosis not present

## 2023-01-07 LAB — CBC WITH DIFFERENTIAL/PLATELET
Abs Immature Granulocytes: 0.05 10*3/uL (ref 0.00–0.07)
Basophils Absolute: 0 10*3/uL (ref 0.0–0.1)
Basophils Relative: 0 %
Eosinophils Absolute: 0.1 10*3/uL (ref 0.0–0.5)
Eosinophils Relative: 1 %
HCT: 35.9 % — ABNORMAL LOW (ref 39.0–52.0)
Hemoglobin: 11.9 g/dL — ABNORMAL LOW (ref 13.0–17.0)
Immature Granulocytes: 0 %
Lymphocytes Relative: 7 %
Lymphs Abs: 0.8 10*3/uL (ref 0.7–4.0)
MCH: 31.6 pg (ref 26.0–34.0)
MCHC: 33.1 g/dL (ref 30.0–36.0)
MCV: 95.5 fL (ref 80.0–100.0)
Monocytes Absolute: 0.8 10*3/uL (ref 0.1–1.0)
Monocytes Relative: 7 %
Neutro Abs: 9.5 10*3/uL — ABNORMAL HIGH (ref 1.7–7.7)
Neutrophils Relative %: 85 %
Platelets: 212 10*3/uL (ref 150–400)
RBC: 3.76 MIL/uL — ABNORMAL LOW (ref 4.22–5.81)
RDW: 13.8 % (ref 11.5–15.5)
WBC: 11.2 10*3/uL — ABNORMAL HIGH (ref 4.0–10.5)
nRBC: 0 % (ref 0.0–0.2)

## 2023-01-07 LAB — COMPREHENSIVE METABOLIC PANEL
ALT: 30 U/L (ref 0–44)
AST: 30 U/L (ref 15–41)
Albumin: 4 g/dL (ref 3.5–5.0)
Alkaline Phosphatase: 37 U/L — ABNORMAL LOW (ref 38–126)
Anion gap: 11 (ref 5–15)
BUN: 18 mg/dL (ref 8–23)
CO2: 22 mmol/L (ref 22–32)
Calcium: 8.5 mg/dL — ABNORMAL LOW (ref 8.9–10.3)
Chloride: 101 mmol/L (ref 98–111)
Creatinine, Ser: 1.18 mg/dL (ref 0.61–1.24)
GFR, Estimated: >60 mL/min (ref >60)
Glucose, Bld: 167 mg/dL — ABNORMAL HIGH (ref 70–99)
Potassium: 3.6 mmol/L (ref 3.5–5.1)
Sodium: 134 mmol/L — ABNORMAL LOW (ref 135–145)
Total Bilirubin: 0.8 mg/dL (ref 0.3–1.2)
Total Protein: 7.6 g/dL (ref 6.5–8.1)

## 2023-01-07 LAB — RESP PANEL BY RT-PCR (RSV, FLU A&B, COVID)  RVPGX2
Influenza A by PCR: NEGATIVE
Influenza B by PCR: NEGATIVE
Resp Syncytial Virus by PCR: NEGATIVE
SARS Coronavirus 2 by RT PCR: NEGATIVE

## 2023-01-07 LAB — BRAIN NATRIURETIC PEPTIDE: B Natriuretic Peptide: 21.8 pg/mL (ref 0.0–100.0)

## 2023-01-07 MED ORDER — PREDNISONE 20 MG PO TABS
40.0000 mg | ORAL_TABLET | Freq: Every day | ORAL | Status: DC
  Administered 2023-01-08: 40 mg via ORAL
  Filled 2023-01-07: qty 2

## 2023-01-07 MED ORDER — ACETAMINOPHEN 650 MG RE SUPP: 650.0000 mg | Freq: Four times a day (QID) | RECTAL | Status: DC | PRN

## 2023-01-07 MED ORDER — SODIUM CHLORIDE 0.9 % IV SOLN
2.0000 g | INTRAVENOUS | Status: DC
  Administered 2023-01-08 – 2023-01-11 (×4): 2 g via INTRAVENOUS
  Filled 2023-01-07 (×5): qty 20

## 2023-01-07 MED ORDER — ONDANSETRON HCL 4 MG PO TABS: 4.0000 mg | ORAL_TABLET | Freq: Four times a day (QID) | ORAL | Status: DC | PRN

## 2023-01-07 MED ORDER — SODIUM CHLORIDE 0.9 % IV SOLN
500.0000 mg | Freq: Once | INTRAVENOUS | Status: AC
  Administered 2023-01-07: 500 mg via INTRAVENOUS
  Filled 2023-01-07: qty 5

## 2023-01-07 MED ORDER — ACETAMINOPHEN 325 MG PO TABS
650.0000 mg | ORAL_TABLET | Freq: Four times a day (QID) | ORAL | Status: DC | PRN
  Administered 2023-01-08: 650 mg via ORAL
  Filled 2023-01-07: qty 2

## 2023-01-07 MED ORDER — ALBUTEROL SULFATE (2.5 MG/3ML) 0.083% IN NEBU
10.0000 mg | INHALATION_SOLUTION | Freq: Once | RESPIRATORY_TRACT | Status: AC
  Administered 2023-01-07: 10 mg via RESPIRATORY_TRACT
  Filled 2023-01-07: qty 12

## 2023-01-07 MED ORDER — METHYLPREDNISOLONE SODIUM SUCC 125 MG IJ SOLR
125.0000 mg | Freq: Once | INTRAMUSCULAR | Status: AC
  Administered 2023-01-07: 125 mg via INTRAVENOUS
  Filled 2023-01-07: qty 2

## 2023-01-07 MED ORDER — LORAZEPAM 2 MG/ML IJ SOLN
0.5000 mg | Freq: Once | INTRAMUSCULAR | Status: AC
  Administered 2023-01-07: 0.5 mg via INTRAVENOUS
  Filled 2023-01-07: qty 1

## 2023-01-07 MED ORDER — AZITHROMYCIN 250 MG PO TABS: 500.0000 mg | ORAL_TABLET | Freq: Every day | ORAL | Status: DC

## 2023-01-07 MED ORDER — HEPARIN SODIUM (PORCINE) 5000 UNIT/ML IJ SOLN: 5000.0000 [IU] | Freq: Three times a day (TID) | INTRAMUSCULAR | Status: DC

## 2023-01-07 MED ORDER — IOHEXOL 350 MG/ML SOLN
100.0000 mL | Freq: Once | INTRAVENOUS | Status: AC | PRN
  Administered 2023-01-07: 100 mL via INTRAVENOUS

## 2023-01-07 MED ORDER — ONDANSETRON HCL 4 MG/2ML IJ SOLN: 4.0000 mg | Freq: Four times a day (QID) | INTRAMUSCULAR | Status: DC | PRN

## 2023-01-07 MED ORDER — GABAPENTIN 300 MG PO CAPS: 300.0000 mg | ORAL_CAPSULE | ORAL | Status: DC

## 2023-01-07 MED ORDER — SODIUM CHLORIDE 0.9 % IV SOLN
1.0000 g | Freq: Once | INTRAVENOUS | Status: AC
  Administered 2023-01-07: 1 g via INTRAVENOUS
  Filled 2023-01-07: qty 10

## 2023-01-07 MED ORDER — IPRATROPIUM-ALBUTEROL 0.5-2.5 (3) MG/3ML IN SOLN
3.0000 mL | Freq: Four times a day (QID) | RESPIRATORY_TRACT | Status: DC
  Administered 2023-01-08 (×4): 3 mL via RESPIRATORY_TRACT
  Filled 2023-01-07 (×4): qty 3

## 2023-01-07 MED ORDER — LORAZEPAM 2 MG/ML IJ SOLN
1.0000 mg | Freq: Once | INTRAMUSCULAR | Status: AC
  Administered 2023-01-07: 1 mg via INTRAVENOUS
  Filled 2023-01-07: qty 1

## 2023-01-07 NOTE — ED Notes (Signed)
Pt started on albuterol treatment 

## 2023-01-07 NOTE — H&P (Signed)
History and Physical    Jeffrey [name] [name]:[id number] DOB: 08-02-1946 DOA: 01/07/2023  DOS: the patient was seen and examined on 01/07/2023  PCP: [name]., MD   Patient coming from: Home  I have personally briefly reviewed patient's old medical records in [facility]  CC: hypoxia HPI: 77 year old male history of neuropathy, morbid obesity who presents to the ER today with a 12-day history of worsening cough.  Patient this was allergies.  This started around April 29.  He has noted progressive shortness of breath.  He was seen in the office today by his primary care doctor at [facility].  He had chest x-ray performed.  He was diagnosed with right lower lobe pneumonia.  He was prescribed Levaquin.  He took 1 dose.  He was instructed to call 911 if his pulse oximetry fell below 88%.  His pulse ox in the office was between 89 to 91%.  Patient states that his pulse oximetry dropped to 85% and called 911.  He is currently taking care of his wife who has had a recent knee surgery.  Limited EMR chart was reviewed.  Previous PCP notes, patient has had a prior CT scan that is not within the [facility] health epic system that shows the patient has findings consistent with pulmonary fibrosis.  Patient has been referred to pulmonology.  Patient had PFTs done in the past that showed a FEV1 to FVC ratio of 78%.  Unclear when this was.  Patient also had a VATS procedure due to an empyema back in December 2009.  Labs: White count 11.2, hemoglobin 11.9, platelets of 212  Sodium 134, potassium 3.6, BUN of 18, creatinine 1.18 Glucose 167 BNP 21.8  COVID-negative, influenza negative, RSV negative  CTPA negative for PE.  Some patchy atelectasis of the right lower lobe.  Patient been previously started on supplemental oxygen by EMS  [facility] hospitalist contacted for admission.    ED Course: CTPA shows RLL patchy infiltrate  Review of Systems:  Review of Systems   Constitutional:  Positive for malaise/fatigue. Negative for chills and fever.  HENT: Negative.    Eyes: Negative.   Respiratory:  Positive for cough and shortness of breath.   Cardiovascular: Negative.   Gastrointestinal: Negative.   Genitourinary: Negative.   Musculoskeletal: Negative.   Skin: Negative.   Neurological:  Positive for tingling.       Chronic LE neuropathy  Endo/Heme/Allergies: Negative.   Psychiatric/Behavioral: Negative.    All other systems reviewed and are negative.   Past Medical History:  Diagnosis Date   Arthritis    Difficulty sleeping    EMPYEMA 10/01/2008   Pneumonia    RCT (rotator cuff tear)    LEFT    Past Surgical History:  Procedure Laterality Date   BALLOON DILATION  09/01/2011   Procedure: BALLOON DILATION;  Surgeon: [name], MD;  Location: [facility];  Service: Endoscopy;  Laterality: N/A;   ESOPHAGOGASTRODUODENOSCOPY  09/01/2011   Procedure: ESOPHAGOGASTRODUODENOSCOPY (EGD);  Surgeon: [name], MD;  Location: [facility];  Service: Endoscopy;  Laterality: N/A;   lumbar disc surgery-2007     LUNG SURGERY     REMOVAL OF EMPYEMA   post neck surgery  1966   post neck surgeryx2     SHOULDER OPEN ROTATOR CUFF REPAIR Left 12/03/2015   Procedure: LEFT SHOULDER MINI OPEN ROTATOR CUFF REPAIR  ;  Surgeon: Jene Every, MD;  Location: [facility];  Service: Orthopedics;  Laterality: Left;  thoracic sug. due to pneumonia  dec. 2010   TRANSFORAMINAL LUMBAR INTERBODY FUSION (TLIF) WITH PEDICLE SCREW FIXATION 1 LEVEL N/A 12/07/2019   Procedure: Lumbar Four-Five Transforaminal lumbar interbody fusion;  Surgeon: Maeola Harman, MD;  Location: [facility];  Service: Neurosurgery;  Laterality: N/A;  Lumbar Four-Five Transforaminal lumbar interbody fusion     reports that he has never smoked. He has never used smokeless tobacco. He reports that he does not use drugs. No history on file for alcohol use.  No Known Allergies  Family History  Problem  Relation Age of Onset   Cancer Mother    Pulmonary fibrosis Mother    Emphysema Father    Pulmonary fibrosis Brother    Lung disease Other     Prior to Admission medications   Medication Sig Start Date End Date Taking? Authorizing Provider  albuterol (VENTOLIN HFA) 108 (90 Base) MCG/ACT inhaler Inhale 2 puffs into the lungs every 6 (six) hours as needed for wheezing or shortness of breath. 01/07/23  Yes [provider]  B Complex-C (B-COMPLEX WITH VITAMIN C) tablet Take 1 tablet by mouth in the morning and at bedtime.   Yes [provider]  Cholecalciferol (VITAMIN D3 PO) Take 1 tablet by mouth daily.   Yes [provider]  gabapentin (NEURONTIN) 300 MG capsule One po qAM, one po qPM and to po qHS Patient taking differently: Take 300-600 mg by mouth See admin instructions. Take 300 mg by mouth in the morning & evening and 600 mg at bedtime 04/26/22  Yes Sater, Pearletha Furl, MD  levofloxacin (LEVAQUIN) 500 MG tablet Take 500 mg by mouth daily. 01/07/23 01/14/23 Yes [provider]  Multiple Vitamin (MULTIVITAMIN WITH MINERALS) TABS tablet Take 1 tablet by mouth daily.    Yes [provider]  polycarbophil (FIBERCON) 625 MG tablet Take 625 mg by mouth in the morning and at bedtime.    Yes [provider]  Probiotic Product (PROBIOTIC PO) Take 1 tablet by mouth daily.   Yes [provider]  TRELEGY ELLIPTA 100-62.5-25 MCG/ACT AEPB Inhale 1 puff into the lungs daily.   Yes [provider]  docusate sodium (COLACE) 100 MG capsule Take 1 capsule (100 mg total) by mouth 2 (two) times daily. Patient not taking: Reported on 01/07/2023 12/08/19   Tressie Stalker, MD  methocarbamol (ROBAXIN) 500 MG tablet Take 1 tablet (500 mg total) by mouth every 6 (six) hours as needed for muscle spasms. Patient not taking: Reported on 01/07/2023 12/08/19   Tressie Stalker, MD    Physical Exam: Vitals:   01/07/23 1647 01/07/23 1714 01/07/23 2223   Temp:  99 F (37.2 C) 98.8 F (37.1 C)  TempSrc:  Oral Oral  Weight: 125.2 kg    Height: 5\' 11"  (1.803 m)      Physical Exam Vitals and nursing note reviewed.  Constitutional:      General: He is not in acute distress.    Appearance: Normal appearance. He is obese. He is not toxic-appearing or diaphoretic.  HENT:     Head: Normocephalic and atraumatic.     Nose: Nose normal.  Eyes:     General: No scleral icterus. Cardiovascular:     Rate and Rhythm: Normal rate and regular rhythm.     Pulses: Normal pulses.     Heart sounds: Normal heart sounds.  Pulmonary:     Effort: No respiratory distress.     Breath sounds: Wheezing present.  Abdominal:     General: Abdomen  is protuberant. Bowel sounds are normal. There is no distension.     Palpations: Abdomen is soft.     Tenderness: There is no abdominal tenderness.  Musculoskeletal:     Right lower leg: No edema.     Left lower leg: No edema.  Skin:    General: Skin is warm and dry.     Capillary Refill: Capillary refill takes less than 2 seconds.  Neurological:     General: No focal deficit present.     Mental Status: He is alert and oriented to person, place, and time.      Labs on Admission: I have personally reviewed following labs and imaging studies  CBC: Recent Labs  Lab 01/07/23 1700  WBC 11.2*  NEUTROABS 9.5*  HGB 11.9*  HCT 35.9*  MCV 95.5  PLT 212   Basic Metabolic Panel: Recent Labs  Lab 01/07/23 1700  NA 134*  K 3.6  CL 101  CO2 22  GLUCOSE 167*  BUN 18  CREATININE 1.18  CALCIUM 8.5*   GFR: Estimated Creatinine Clearance: 74 mL/min (by C-G formula based on SCr of 1.18 mg/dL). Liver Function Tests: Recent Labs  Lab 01/07/23 1700  AST 30  ALT 30  ALKPHOS 37*  BILITOT 0.8  PROT 7.6  ALBUMIN 4.0   BNP (last 3 results) Recent Labs    01/07/23 1700  BNP 21.8   Radiological Exams on Admission: I have personally reviewed images CT Angio Chest PE W and/or Wo Contrast  Result  Date: 01/07/2023 CLINICAL DATA:  Pulmonary embolism suspected, high probability. Shortness of breath. EXAM: CT ANGIOGRAPHY CHEST WITH CONTRAST TECHNIQUE: Multidetector CT imaging of the chest was performed using the standard protocol during bolus administration of intravenous contrast. Multiplanar CT image reconstructions and MIPs were obtained to evaluate the vascular anatomy. RADIATION DOSE REDUCTION: This exam was performed according to the departmental dose-optimization program which includes automated exposure control, adjustment of the mA and/or kV according to patient size and/or use of iterative reconstruction technique. CONTRAST:  OMNIPAQUE IOHEXOL 350 MG/ML SOLN COMPARISON:  09/07/2012. FINDINGS: Cardiovascular: The heart is normal in size and there is no pericardial effusion. Multi-vessel coronary artery calcifications are noted. There is atherosclerotic calcification of the aorta without evidence of aneurysm. Pulmonary trunk is normal in caliber. No definite evidence of pulmonary embolism. Mediastinum/Nodes: Enlarged lymph nodes are noted in the mediastinum measuring up to 1 point 4 cm in the left paratracheal space. No hilar or axillary lymphadenopathy. The thyroid gland, trachea, and esophagus are within normal limits. Lungs/Pleura: Bronchial wall thickening is present bilaterally. A few scattered ground-glass opacities are present bilaterally. Patchy atelectasis or infiltrate is noted in the right lower lobe. Scattered atelectasis or scarring is present bilaterally. No effusion or pneumothorax. Upper Abdomen: There is a cyst in the posterior right lobe of the liver. Fatty infiltration of the liver is noted. A cyst is present in the left kidney. No adrenal nodule. No acute abnormality. Musculoskeletal: Cervical spinal fusion hardware is noted. No acute osseous abnormality is seen. Review of the MIP images confirms the above findings. IMPRESSION: 1. No evidence of pulmonary embolism. 2. Bronchial  wall thickening bilaterally with patchy atelectasis or infiltrate in the right lower lobe. 3. Hepatic steatosis. 4. Aortic atherosclerosis and coronary artery calcifications. Electronically Signed   By: Thornell Sartorius M.D.   On: 01/07/2023 21:13   DG Chest Port 1 View  Result Date: 01/07/2023 CLINICAL DATA:  Shortness of breath EXAM: PORTABLE CHEST 1 VIEW COMPARISON:  Chest  x-ray 11/06/2008 FINDINGS: The heart size and mediastinal contours are within normal limits. Both lungs are clear. There are no acute fractures. Cervical spinal fusion plate is present. IMPRESSION: No active disease. Electronically Signed   By: Darliss Cheney M.D.   On: 01/07/2023 17:48    EKG: My personal interpretation of EKG shows: no EKG to review  Assessment/Plan Principal Problem:   CAP (community acquired pneumonia) Active Problems:   Acute respiratory failure with hypoxia (HCC)   Polyneuropathy   Morbid obesity (HCC)    Assessment and Plan: * CAP (community acquired pneumonia) Observation med/surg bed. Continue rocephin/zithromax. Check legionella/strep antigen. Pt had Rx for levaquin that he picked up today. Pt still wheezing despite 10 mg albuterol neb. Will start IV solumedrol and change to po prednisone.  Acute respiratory failure with hypoxia (HCC) Does not wear home O2. Wean O2 as tolerated.  Morbid obesity (HCC) BMI 38.5  Polyneuropathy Follows with [facility] for his neuropathy   DVT prophylaxis: SQ Heparin Code Status: Full Code Family Communication: no family at bedside  Disposition Plan: return home  Consults called: none  Admission status: Observation, Med-Surg   Carollee Herter, DO [facility] 01/07/2023, 11:25 PM

## 2023-01-07 NOTE — Assessment & Plan Note (Signed)
BMI 38.5

## 2023-01-07 NOTE — Assessment & Plan Note (Signed)
Does not wear home O2. Wean O2 as tolerated.

## 2023-01-07 NOTE — Assessment & Plan Note (Signed)
Follows with [facility] for his neuropathy

## 2023-01-07 NOTE — ED Provider Notes (Signed)
[place] EMERGENCY DEPARTMENT AT [facility] Provider Note   CSN: [id number] Arrival date & time: 01/07/23  1618     History  Chief Complaint  Patient presents with   Shortness of Breath    Jeffrey [name] is a 77 y.o. male.  HPI Patient presents via EMS.  History is by EMS and the patient himself.  Patient has had difficulty breathing for 3 days, saw his physician today was diagnosed with pneumonia.  Patient started levofloxacin today, has taken his first dose.  He reports that his physician told him to go to the emergency department if his oxygen saturation level 90%.  Per EMS patient had saturation 85% on room air.  EMS provided albuterol, Atrovent, with some improvement with supplemental oxygen. Patient with a history of pneumonia complicated by development of what sounds to be abscess requiring right lower lobe lobectomy in the distant past.    Home Medications Prior to Admission medications   Medication Sig Start Date End Date Taking? Authorizing Provider  B Complex-C (B-COMPLEX WITH VITAMIN C) tablet Take 1 tablet by mouth in the morning and at bedtime.    [provider]  Cholecalciferol (VITAMIN D3 PO) Take 1 tablet by mouth daily.    [provider]  docusate sodium (COLACE) 100 MG capsule Take 1 capsule (100 mg total) by mouth 2 (two) times daily. 12/08/19   Tressie Stalker, MD  gabapentin (NEURONTIN) 300 MG capsule One po qAM, one po qPM and to po qHS 04/26/22   Sater, Pearletha Furl, MD  methocarbamol (ROBAXIN) 500 MG tablet Take 1 tablet (500 mg total) by mouth every 6 (six) hours as needed for muscle spasms. 12/08/19   Tressie Stalker, MD  Multiple Vitamin (MULTIVITAMIN WITH MINERALS) TABS tablet Take 1 tablet by mouth daily.     [provider]  polycarbophil (FIBERCON) 625 MG tablet Take 625 mg by mouth in the morning and at bedtime.     [provider]  Probiotic Product (PROBIOTIC PO) Take 1 tablet by mouth daily.     [provider]      Allergies    Patient has no known allergies.    Review of Systems   Review of Systems  All other systems reviewed and are negative.   Physical Exam Updated Vital Signs Temp 99 F (37.2 C) (Oral)   Ht 5\' 11"  (1.803 m)   Wt 125.2 kg   BMI 38.50 kg/m  Physical Exam Vitals and nursing note reviewed.  Constitutional:      General: He is not in acute distress.    Appearance: He is well-developed.  HENT:     Head: Normocephalic and atraumatic.  Eyes:     Conjunctiva/sclera: Conjunctivae normal.  Cardiovascular:     Rate and Rhythm: Regular rhythm. Tachycardia present.  Pulmonary:     Effort: Pulmonary effort is normal. No respiratory distress.     Breath sounds: No stridor. Decreased breath sounds present. No wheezing.  Abdominal:     General: There is no distension.  Skin:    General: Skin is warm and dry.  Neurological:     Mental Status: He is alert and oriented to person, place, and time.     ED Results / Procedures / Treatments   Labs (all labs ordered are listed, but only abnormal results are displayed) Labs Reviewed  COMPREHENSIVE METABOLIC PANEL - Abnormal; Notable for the following components:      Result Value   Sodium 134 (*)  Glucose, Bld 167 (*)    Calcium 8.5 (*)    Alkaline Phosphatase 37 (*)    All other components within normal limits  CBC WITH DIFFERENTIAL/PLATELET - Abnormal; Notable for the following components:   WBC 11.2 (*)    RBC 3.76 (*)    Hemoglobin 11.9 (*)    HCT 35.9 (*)    Neutro Abs 9.5 (*)    All other components within normal limits  RESP PANEL BY RT-PCR (RSV, FLU A&B, COVID)  [id number]  BRAIN NATRIURETIC PEPTIDE    EKG None  Radiology CT Angio Chest PE W and/or Wo Contrast  Result Date: 01/07/2023 CLINICAL DATA:  Pulmonary embolism suspected, high probability. Shortness of breath. EXAM: CT ANGIOGRAPHY CHEST WITH CONTRAST TECHNIQUE: Multidetector CT imaging of the chest was performed using  the standard protocol during bolus administration of intravenous contrast. Multiplanar CT image reconstructions and MIPs were obtained to evaluate the vascular anatomy. RADIATION DOSE REDUCTION: This exam was performed according to the departmental dose-optimization program which includes automated exposure control, adjustment of the mA and/or kV according to patient size and/or use of iterative reconstruction technique. CONTRAST:  OMNIPAQUE IOHEXOL 350 MG/ML SOLN COMPARISON:  09/07/2012. FINDINGS: Cardiovascular: The heart is normal in size and there is no pericardial effusion. Multi-vessel coronary artery calcifications are noted. There is atherosclerotic calcification of the aorta without evidence of aneurysm. Pulmonary trunk is normal in caliber. No definite evidence of pulmonary embolism. Mediastinum/Nodes: Enlarged lymph nodes are noted in the mediastinum measuring up to 1 point 4 cm in the left paratracheal space. No hilar or axillary lymphadenopathy. The thyroid gland, trachea, and esophagus are within normal limits. Lungs/Pleura: Bronchial wall thickening is present bilaterally. A few scattered ground-glass opacities are present bilaterally. Patchy atelectasis or infiltrate is noted in the right lower lobe. Scattered atelectasis or scarring is present bilaterally. No effusion or pneumothorax. Upper Abdomen: There is a cyst in the posterior right lobe of the liver. Fatty infiltration of the liver is noted. A cyst is present in the left kidney. No adrenal nodule. No acute abnormality. Musculoskeletal: Cervical spinal fusion hardware is noted. No acute osseous abnormality is seen. Review of the MIP images confirms the above findings. IMPRESSION: 1. No evidence of pulmonary embolism. 2. Bronchial wall thickening bilaterally with patchy atelectasis or infiltrate in the right lower lobe. 3. Hepatic steatosis. 4. Aortic atherosclerosis and coronary artery calcifications. Electronically Signed   By: Thornell Sartorius M.D.   On: 01/07/2023 21:13   DG Chest Port 1 View  Result Date: 01/07/2023 CLINICAL DATA:  Shortness of breath EXAM: PORTABLE CHEST 1 VIEW COMPARISON:  Chest x-ray 11/06/2008 FINDINGS: The heart size and mediastinal contours are within normal limits. Both lungs are clear. There are no acute fractures. Cervical spinal fusion plate is present. IMPRESSION: No active disease. Electronically Signed   By: Darliss Cheney M.D.   On: 01/07/2023 17:48    Procedures Procedures    Medications Ordered in ED Medications  cefTRIAXone (ROCEPHIN) 1 g in sodium chloride 0.9 % 100 mL IVPB (1 g Intravenous New Bag/Given 01/07/23 2141)  azithromycin (ZITHROMAX) 500 mg in sodium chloride 0.9 % 250 mL IVPB (has no administration in time range)  LORazepam (ATIVAN) injection 0.5 mg (0.5 mg Intravenous Given 01/07/23 1719)  iohexol (OMNIPAQUE) 350 MG/ML injection 100 mL (100 mLs Intravenous Contrast Given 01/07/23 2024)    ED Course/ Medical Decision Making/ A&P  Medical Decision Making Elderly male with history of complicated pneumonia presents with increased work of breathing, hypoxia in the context of outpatient diagnosis of pneumonia.  Concern for complicated pneumonia versus sepsis, less likely heart failure versus COPD.  Patient continued supplemental oxygen on arrival. Pulse ox 95% with supplemental oxygen   Amount and/or Complexity of Data Reviewed Independent Historian: EMS External Data Reviewed: notes. Labs: ordered. Decision-making details documented in ED Course. Radiology: ordered and independent interpretation performed. Decision-making details documented in ED Course. ECG/medicine tests: ordered and independent interpretation performed. Decision-making details documented in ED Course.  Risk Prescription drug management.   9:55 PM Patient marginally clinically better, but continues to require supplemental oxygen, 2 L, remains tachycardic.  CT reviewed, x-ray  reviewed, findings concerning for right lower lobe pneumonia.  In the context of a gentleman with prior pneumonia requiring lobectomy, with new oxygen requirement, patient will require admission for ongoing monitoring, management.         Final Clinical Impression(s) / ED Diagnoses Final diagnoses:  Community acquired pneumonia of right lower lobe of lung     Gerhard Munch, MD 01/07/23 2155

## 2023-01-07 NOTE — ED Notes (Signed)
Pt placed on non-rebreather due to 88% o2 sat. Once placed on at 8l pt came up to 8L. RT has been paged

## 2023-01-07 NOTE — Subjective & Objective (Signed)
CC: hypoxia HPI: 77 year old male history of neuropathy, morbid obesity who presents to the ER today with a 12-day history of worsening cough.  Patient this was allergies.  This started around April 29.  He has noted progressive shortness of breath.  He was seen in the office today by his primary care doctor at [facility].  He had chest x-ray performed.  He was diagnosed with right lower lobe pneumonia.  He was prescribed Levaquin.  He took 1 dose.  He was instructed to call 911 if his pulse oximetry fell below 88%.  His pulse ox in the office was between 89 to 91%.  Patient states that his pulse oximetry dropped to 85% and called 911.  He is currently taking care of his wife who has had a recent knee surgery.  Limited EMR chart was reviewed.  Previous PCP notes, patient has had a prior CT scan that is not within the [facility] health epic system that shows the patient has findings consistent with pulmonary fibrosis.  Patient has been referred to pulmonology.  Patient had PFTs done in the past that showed a FEV1 to FVC ratio of 78%.  Unclear when this was.  Patient also had a VATS procedure due to an empyema back in December 2009.  Labs: White count 11.2, hemoglobin 11.9, platelets of 212  Sodium 134, potassium 3.6, BUN of 18, creatinine 1.18 Glucose 167 BNP 21.8  COVID-negative, influenza negative, RSV negative  CTPA negative for PE.  Some patchy atelectasis of the right lower lobe.  Patient been previously started on supplemental oxygen by EMS  [facility] contacted for admission.

## 2023-01-07 NOTE — Assessment & Plan Note (Deleted)
Observation med/surg bed. Continue rocephin/zithromax. Check legionella/strep antigen. Pt had Rx for levaquin that he picked up today. Pt still wheezing despite 10 mg albuterol neb. Will start IV solumedrol and change to po prednisone.

## 2023-01-07 NOTE — ED Triage Notes (Signed)
Patient brought in from home by EMS with c/o SOB. Patient states he was recently diagnosed with Pneumonia and took only 1 dose of prescribe ABX and was told by provider if O2 dropped below 90% to call EMS. Patient O2 was 85% on RA. EMS gave 15mg  of albuterol, 1mg  of Atrovent and patient reports taking 650mg  of tylenol around 1610. 172/82 116 32 85% RA 100.4  CBG:111

## 2023-01-08 ENCOUNTER — Encounter (HOSPITAL_COMMUNITY): Payer: Self-pay | Admitting: Internal Medicine

## 2023-01-08 DIAGNOSIS — J13 Pneumonia due to Streptococcus pneumoniae: Secondary | ICD-10-CM | POA: Diagnosis present

## 2023-01-08 DIAGNOSIS — Z79899 Other long term (current) drug therapy: Secondary | ICD-10-CM | POA: Diagnosis not present

## 2023-01-08 DIAGNOSIS — Z8701 Personal history of pneumonia (recurrent): Secondary | ICD-10-CM | POA: Diagnosis not present

## 2023-01-08 DIAGNOSIS — M1990 Unspecified osteoarthritis, unspecified site: Secondary | ICD-10-CM | POA: Diagnosis present

## 2023-01-08 DIAGNOSIS — Z713 Dietary counseling and surveillance: Secondary | ICD-10-CM | POA: Diagnosis not present

## 2023-01-08 DIAGNOSIS — Z825 Family history of asthma and other chronic lower respiratory diseases: Secondary | ICD-10-CM | POA: Diagnosis not present

## 2023-01-08 DIAGNOSIS — J9811 Atelectasis: Secondary | ICD-10-CM | POA: Diagnosis not present

## 2023-01-08 DIAGNOSIS — Z981 Arthrodesis status: Secondary | ICD-10-CM | POA: Diagnosis not present

## 2023-01-08 DIAGNOSIS — R5381 Other malaise: Secondary | ICD-10-CM | POA: Diagnosis present

## 2023-01-08 DIAGNOSIS — R040 Epistaxis: Secondary | ICD-10-CM | POA: Diagnosis not present

## 2023-01-08 DIAGNOSIS — Z1152 Encounter for screening for COVID-19: Secondary | ICD-10-CM | POA: Diagnosis not present

## 2023-01-08 DIAGNOSIS — Z6838 Body mass index (BMI) 38.0-38.9, adult: Secondary | ICD-10-CM | POA: Diagnosis not present

## 2023-01-08 DIAGNOSIS — G629 Polyneuropathy, unspecified: Secondary | ICD-10-CM | POA: Diagnosis present

## 2023-01-08 DIAGNOSIS — R030 Elevated blood-pressure reading, without diagnosis of hypertension: Secondary | ICD-10-CM | POA: Diagnosis not present

## 2023-01-08 DIAGNOSIS — J9690 Respiratory failure, unspecified, unspecified whether with hypoxia or hypercapnia: Secondary | ICD-10-CM | POA: Diagnosis not present

## 2023-01-08 DIAGNOSIS — J189 Pneumonia, unspecified organism: Secondary | ICD-10-CM | POA: Diagnosis present

## 2023-01-08 DIAGNOSIS — G4700 Insomnia, unspecified: Secondary | ICD-10-CM | POA: Diagnosis not present

## 2023-01-08 DIAGNOSIS — J9601 Acute respiratory failure with hypoxia: Secondary | ICD-10-CM

## 2023-01-08 LAB — BLOOD GAS, ARTERIAL
Acid-base deficit: 0.3 mmol/L (ref 0.0–2.0)
Bicarbonate: 25.4 mmol/L (ref 20.0–28.0)
Drawn by: 225631
O2 Content: 9 L/min
O2 Saturation: 98.7 %
Patient temperature: 37
pCO2 arterial: 45 mmHg (ref 32–48)
pH, Arterial: 7.36 (ref 7.35–7.45)
pO2, Arterial: 125 mmHg — ABNORMAL HIGH (ref 83–108)

## 2023-01-08 LAB — COMPREHENSIVE METABOLIC PANEL
ALT: 28 U/L (ref 0–44)
AST: 30 U/L (ref 15–41)
Albumin: 3.8 g/dL (ref 3.5–5.0)
Alkaline Phosphatase: 31 U/L — ABNORMAL LOW (ref 38–126)
Anion gap: 11 (ref 5–15)
BUN: 19 mg/dL (ref 8–23)
CO2: 23 mmol/L (ref 22–32)
Calcium: 8.1 mg/dL — ABNORMAL LOW (ref 8.9–10.3)
Chloride: 100 mmol/L (ref 98–111)
Creatinine, Ser: 1.19 mg/dL (ref 0.61–1.24)
GFR, Estimated: >60 mL/min (ref >60)
Glucose, Bld: 177 mg/dL — ABNORMAL HIGH (ref 70–99)
Potassium: 4.1 mmol/L (ref 3.5–5.1)
Sodium: 134 mmol/L — ABNORMAL LOW (ref 135–145)
Total Bilirubin: 0.6 mg/dL (ref 0.3–1.2)
Total Protein: 7.1 g/dL (ref 6.5–8.1)

## 2023-01-08 LAB — CBC WITH DIFFERENTIAL/PLATELET
Abs Immature Granulocytes: 0.09 10*3/uL — ABNORMAL HIGH (ref 0.00–0.07)
Basophils Absolute: 0 10*3/uL (ref 0.0–0.1)
Basophils Relative: 0 %
Eosinophils Absolute: 0 10*3/uL (ref 0.0–0.5)
Eosinophils Relative: 0 %
HCT: 34.6 % — ABNORMAL LOW (ref 39.0–52.0)
Hemoglobin: 11.1 g/dL — ABNORMAL LOW (ref 13.0–17.0)
Immature Granulocytes: 1 %
Lymphocytes Relative: 3 %
Lymphs Abs: 0.4 10*3/uL — ABNORMAL LOW (ref 0.7–4.0)
MCH: 31.1 pg (ref 26.0–34.0)
MCHC: 32.1 g/dL (ref 30.0–36.0)
MCV: 96.9 fL (ref 80.0–100.0)
Monocytes Absolute: 0.5 10*3/uL (ref 0.1–1.0)
Monocytes Relative: 3 %
Neutro Abs: 13.1 10*3/uL — ABNORMAL HIGH (ref 1.7–7.7)
Neutrophils Relative %: 93 %
Platelets: 208 10*3/uL (ref 150–400)
RBC: 3.57 MIL/uL — ABNORMAL LOW (ref 4.22–5.81)
RDW: 14.2 % (ref 11.5–15.5)
WBC: 14.1 10*3/uL — ABNORMAL HIGH (ref 4.0–10.5)
nRBC: 0 % (ref 0.0–0.2)

## 2023-01-08 LAB — PROCALCITONIN
Procalcitonin: 1.95 ng/mL
Procalcitonin: 2.09 ng/mL

## 2023-01-08 LAB — HIV ANTIBODY (ROUTINE TESTING W REFLEX): HIV Screen 4th Generation wRfx: NONREACTIVE

## 2023-01-08 LAB — MAGNESIUM: Magnesium: 1.7 mg/dL (ref 1.7–2.4)

## 2023-01-08 LAB — LACTIC ACID, PLASMA: Lactic Acid, Venous: 1.2 mmol/L (ref 0.5–1.9)

## 2023-01-08 LAB — MRSA NEXT GEN BY PCR, NASAL: MRSA by PCR Next Gen: NOT DETECTED

## 2023-01-08 LAB — STREP PNEUMONIAE URINARY ANTIGEN: Strep Pneumo Urinary Antigen: POSITIVE — AB

## 2023-01-08 MED ORDER — ORAL CARE MOUTH RINSE: 15.0000 mL | OROMUCOSAL | Status: DC | PRN

## 2023-01-08 MED ORDER — GABAPENTIN 300 MG PO CAPS
600.0000 mg | ORAL_CAPSULE | Freq: Every day | ORAL | Status: DC
  Administered 2023-01-08 – 2023-01-13 (×7): 600 mg via ORAL
  Filled 2023-01-08 (×7): qty 2

## 2023-01-08 MED ORDER — ALBUTEROL SULFATE (2.5 MG/3ML) 0.083% IN NEBU
2.5000 mg | INHALATION_SOLUTION | RESPIRATORY_TRACT | Status: DC | PRN
  Administered 2023-01-09: 2.5 mg via RESPIRATORY_TRACT
  Filled 2023-01-08: qty 3

## 2023-01-08 MED ORDER — MELATONIN 5 MG PO TABS
5.0000 mg | ORAL_TABLET | Freq: Every evening | ORAL | Status: AC | PRN
  Administered 2023-01-08 – 2023-01-10 (×2): 5 mg via ORAL
  Filled 2023-01-08 (×2): qty 1

## 2023-01-08 MED ORDER — MELATONIN 3 MG PO TABS
6.0000 mg | ORAL_TABLET | Freq: Every evening | ORAL | Status: AC | PRN
  Administered 2023-01-08: 6 mg via ORAL
  Filled 2023-01-08: qty 2

## 2023-01-08 MED ORDER — IPRATROPIUM-ALBUTEROL 0.5-2.5 (3) MG/3ML IN SOLN
3.0000 mL | Freq: Three times a day (TID) | RESPIRATORY_TRACT | Status: DC
  Administered 2023-01-09 – 2023-01-10 (×4): 3 mL via RESPIRATORY_TRACT
  Filled 2023-01-08 (×4): qty 3

## 2023-01-08 MED ORDER — HEPARIN SODIUM (PORCINE) 5000 UNIT/ML IJ SOLN
5000.0000 [IU] | Freq: Three times a day (TID) | INTRAMUSCULAR | Status: DC
  Administered 2023-01-08: 5000 [IU] via SUBCUTANEOUS
  Filled 2023-01-08 (×2): qty 1

## 2023-01-08 MED ORDER — CHLORHEXIDINE GLUCONATE CLOTH 2 % EX PADS
6.0000 | MEDICATED_PAD | Freq: Every day | CUTANEOUS | Status: DC
  Administered 2023-01-08 – 2023-01-09 (×2): 6 via TOPICAL

## 2023-01-08 MED ORDER — GABAPENTIN 300 MG PO CAPS
300.0000 mg | ORAL_CAPSULE | Freq: Two times a day (BID) | ORAL | Status: DC
  Administered 2023-01-08 – 2023-01-14 (×13): 300 mg via ORAL
  Filled 2023-01-08 (×13): qty 1

## 2023-01-08 MED ORDER — ENOXAPARIN SODIUM 60 MG/0.6ML IJ SOSY
60.0000 mg | PREFILLED_SYRINGE | INTRAMUSCULAR | Status: DC
  Administered 2023-01-08 – 2023-01-13 (×6): 60 mg via SUBCUTANEOUS
  Filled 2023-01-08 (×6): qty 0.6

## 2023-01-08 NOTE — Progress Notes (Signed)
While admitting another patient, patient's leads noted to be removed and patient was desaturating per alert on the monitor. When staff went to check on patient, patient was found in the floor. Patient states he attempted to get up to have a BM, but became tangled in the cords. Patient O2 removed, EKG cords unplugged. No injuries visualized, patient denies pain or injuries, denies hitting head or LOC. Patient able to answer orientation questions with some uncertainty, possibly d/t night time medications (melatonin and gabapentin). NP made aware, floor mats implemented, bed alarm checked. Reinforced patient education on calling for help and not getting out of bed on his own. Patient verbalized understanding.

## 2023-01-08 NOTE — Progress Notes (Signed)
   Pt developed respiratory extremis. Required 10 mg albuterol neb. Now on 10 L/min O2. Will change to SDU bed.  Carollee Herter, DO [facility]

## 2023-01-08 NOTE — Progress Notes (Signed)
  Transition of Care [facility]) Screening Note   Patient Details  Name: Jeffrey [name] Date of Birth: 13-Apr-1946   Transition of Care [facility]) CM/SW Contact:    Adrian Prows, RN Phone Number: 01/08/2023, 4:15 PM    Transition of Care Department [facility]) has reviewed patient and no TOC needs have been identified at this time. We will continue to monitor patient advancement through interdisciplinary progression rounds. If new patient transition needs arise, please place a TOC consult.

## 2023-01-08 NOTE — ED Notes (Signed)
RT paged again

## 2023-01-08 NOTE — Progress Notes (Signed)
   Strep Pneumo antigen positive. Pt on IV Rocephin.  Lab Results  Component Value Date/Time   SPNEUMOAGUR POSITIVE (A) 01/07/2023 11:56 PM   Carollee Herter, DO [facility]

## 2023-01-08 NOTE — Assessment & Plan Note (Addendum)
-  Patient presenting with productive cough, decreased oxygen saturation, and infiltrate in right lower lobe on chest CT (although on exam it sounds more multifocal in nature) -This appears to be most likely community-acquired pneumonia.  -has reported h/o pulm fibrosis (not seen on CT) and empyema requiring VATS -Influenza negative. -COVID-19 negative. -Strep pneumo positive (reports he was previously vaccinated x 2) -Sputum and blood cultures are pending -Procalcitonin is >>0.5 which indicates more serious disease.  As the procalcitonin level normalizes, it will be reasonable to consider de-escalation of antibiotic coverage.  The sensitivity of procalcitonin is variable and should not be used alone to guide treatment. -CURB-65 score is 3, meaning that the patient has a 14.5% risk of death -Pneumonia Severity Index (PSI) is Class 4, 9% mortality. -Will start Azithromycin 500 mg IV daily and Rocephin due to no risk factors for MDR cause  -Additional complicating factors include: marked hypoxia, requiring high amounts of [place] O2 -Fever control -Repeat CBC in am -Will add albuterol PRN -Will add standing Duonebs -Will add Mucinex for cough Will start IV solumedrol and changed to po prednisone. -He is admitted to SDU due to high O2 requirement -Pulmonology consult requested

## 2023-01-08 NOTE — Progress Notes (Signed)
Patient still having hypoxia despite increased O2. Patient on 12L HFNC and satting between 85-91. Patient noted to be wheezing. RT and NP notified.

## 2023-01-08 NOTE — Consult Note (Signed)
NAME:  Jeffrey [name], MRN:  [id number], DOB:  [birth year], LOS: 0 ADMISSION DATE:  01/07/2023, CONSULTATION DATE:  01/08/2023  REFERRING MD:  Myrene Buddy, CHIEF COMPLAINT: Respiratory distress, on high flow oxygen  History of Present Illness:  77 year old obese never smoker was admitted via his PCP office on 5/9 where he presented with 12 days of cough.  Chest x-ray showed right lower lobe pneumonia.  Oxygen saturation was between 89 to 91%.  When he got home and saturations dropped further, he called EMS In the ED was noted to be wheezing despite continuous albuterol neb. He was placed on oxygen and by this morning is requiring 10 to 12 L high flow nasal cannula hence PCCM consulted CT angiogram chest showed scattered groundglass opacities bilateral, patchy atelectasis or infiltrate in the right lower lobe Of note, CT chest from 2014 shows minimal scarring right lower lobe attributed to previous empyema  Labs showed mild leukocytosis, normal BNP, COVID flu RSV testing was negative, urine strep antigen was positive Pertinent  Medical History  Empyema requiring VATS 07/2008 Cervical fusion PCP notes mention pulmonary fibrosis and referral to pulmonary was planned   Significant Hospital Events: Including procedures, antibiotic start and stop dates in addition to other pertinent events     Interim History / Subjective:  Afebrile On 12 L high flow nasal cannula States dyspnea improved  Objective   Blood pressure (!) 131/48, pulse 67, temperature 98.4 F (36.9 C), temperature source Oral, resp. rate 19, height 5\' 11"  (1.803 m), weight 122.6 kg, SpO2 (!) 87 %.        Intake/Output Summary (Last 24 hours) at 01/08/2023 0950 Last data filed at 01/08/2023 0500 Gross per 24 hour  Intake --  Output 750 ml  Net -750 ml   Filed Weights   01/07/23 1647 01/08/23 0156  Weight: 125.2 kg 122.6 kg    Examination: General: Obese man, lying with head of bed elevated, no distress HENT: No  pallor, icterus, no JVD Lungs: Crackles right base, no rhonchi, no accessory muscle use Cardiovascular: S1 and S2 regular, no murmur Abdomen: Obese, soft, nontender Extremities: No edema, no deformity Neuro: Alert, interactive, nonfocal  Labs show mild hyponatremia 134, normal electrolytes, lactate 1.2, slight high procalcitonin, mild leukocytosis   Resolved Hospital Problem list     Assessment & Plan:  Acute hypoxic respiratory failure Pneumococcal pneumonia CT angiogram does not show any evidence of pulmonary fibrosis.  Right lower lobe scarring from past related to history of empyema  -Continue oxygen by salter nasal cannula. -Continue ceftriaxone, can DC azithromycin -Monitor oxygen requirements and chest x-ray for improvement -Started on steroids for wheezing on admission, can discontinue   Best Practice (right click and "Reselect all SmartList Selections" daily)   Per [facility] Code Status:  full code Last date of multidisciplinary goals of care discussion [per [facility]]  Labs   CBC: Recent Labs  Lab 01/07/23 1700 01/08/23 0254  WBC 11.2* 14.1*  NEUTROABS 9.5* 13.1*  HGB 11.9* 11.1*  HCT 35.9* 34.6*  MCV 95.5 96.9  PLT 212 208    Basic Metabolic Panel: Recent Labs  Lab 01/07/23 1700 01/08/23 0254  NA 134* 134*  K 3.6 4.1  CL 101 100  CO2 22 23  GLUCOSE 167* 177*  BUN 18 19  CREATININE 1.18 1.19  CALCIUM 8.5* 8.1*  MG  --  1.7   GFR: Estimated Creatinine Clearance: 72.6 mL/min (by C-G formula based on SCr of 1.19 mg/dL). Recent Labs  Lab 01/07/23 1700  01/08/23 0254 01/08/23 0746  PROCALCITON  --  1.95 2.09  WBC 11.2* 14.1*  --   LATICACIDVEN  --   --  1.2    Liver Function Tests: Recent Labs  Lab 01/07/23 1700 01/08/23 0254  AST 30 30  ALT 30 28  ALKPHOS 37* 31*  BILITOT 0.8 0.6  PROT 7.6 7.1  ALBUMIN 4.0 3.8   No results for input(s): "LIPASE", "AMYLASE" in the last 168 hours. No results for input(s): "AMMONIA" in the last 168  hours.  ABG    Component Value Date/Time   PHART 7.36 01/08/2023 0125   PCO2ART 45 01/08/2023 0125   PO2ART 125 (H) 01/08/2023 0125   HCO3 25.4 01/08/2023 0125   TCO2 25 08/16/2008 0405   ACIDBASEDEF 0.3 01/08/2023 0125   O2SAT 98.7 01/08/2023 0125     Coagulation Profile: No results for input(s): "INR", "PROTIME" in the last 168 hours.  Cardiac Enzymes: No results for input(s): "CKTOTAL", "CKMB", "CKMBINDEX", "TROPONINI" in the last 168 hours.  HbA1C: Hgb A1c MFr Bld  Date/Time Value Ref Range Status  08/12/2008 05:09 AM  4.6 - 6.1 % Final   5.5 (NOTE)   The ADA recommends the following therapeutic goal for glycemic   control related to Hgb A1C measurement:   Goal of Therapy:   < 7.0% Hgb A1C   Reference: American Diabetes Association: Clinical Practice   Recommendations 2008, Diabetes Care,  2008, 31:(Suppl 1).    CBG: No results for input(s): "GLUCAP" in the last 168 hours.  Review of Systems:   Dry cough for 10 days Shortness of breath   Constitutional: negative for anorexia, fevers and sweats  Eyes: negative for irritation, redness and visual disturbance  Ears, nose, mouth, throat, and face: negative for earaches, epistaxis, nasal congestion and sore throat   Cardiovascular: negative for chest pain,  lower extremity edema, orthopnea, palpitations and syncope  Gastrointestinal: negative for abdominal pain, constipation, diarrhea, melena, nausea and vomiting  Genitourinary:negative for dysuria, frequency and hematuria  Hematologic/lymphatic: negative for bleeding, easy bruising and lymphadenopathy  Musculoskeletal:negative for arthralgias, muscle weakness and stiff joints  Neurological: negative for coordination problems, gait problems, headaches and weakness  Endocrine: negative for diabetic symptoms including polydipsia, polyuria and weight loss   Past Medical History:  He,  has a past medical history of Arthritis, Difficulty sleeping, EMPYEMA (10/01/2008),  Pneumonia, and RCT (rotator cuff tear).   Surgical History:   Past Surgical History:  Procedure Laterality Date   BALLOON DILATION  09/01/2011   Procedure: BALLOON DILATION;  Surgeon: [name], MD;  Location: [facility] ENDOSCOPY;  Service: Endoscopy;  Laterality: N/A;   ESOPHAGOGASTRODUODENOSCOPY  09/01/2011   Procedure: ESOPHAGOGASTRODUODENOSCOPY (EGD);  Surgeon: [name], MD;  Location: [facility];  Service: Endoscopy;  Laterality: N/A;   lumbar disc surgery-2007     LUNG SURGERY     REMOVAL OF EMPYEMA   post neck surgery  1966   post neck surgeryx2     SHOULDER OPEN ROTATOR CUFF REPAIR Left 12/03/2015   Procedure: LEFT SHOULDER MINI OPEN ROTATOR CUFF REPAIR  ;  Surgeon: Jene Every, MD;  Location: [facility] ORS;  Service: Orthopedics;  Laterality: Left;   thoracic sug. due to pneumonia  dec. 2010   TRANSFORAMINAL LUMBAR INTERBODY FUSION (TLIF) WITH PEDICLE SCREW FIXATION 1 LEVEL N/A 12/07/2019   Procedure: Lumbar Four-Five Transforaminal lumbar interbody fusion;  Surgeon: Maeola Harman, MD;  Location[facility];  Service: Neurosurgery;  Laterality: N/A;  Lumbar Four-Five Transforaminal lumbar interbody fusion  Social History:   reports that he has never smoked. He has never used smokeless tobacco. He reports that he does not use drugs.   Family History:  His family history includes Cancer in his mother; Emphysema in his father; Lung disease in an other family member; Pulmonary fibrosis in his brother and mother.   Allergies No Known Allergies   Home Medications  Prior to Admission medications   Medication Sig Start Date End Date Taking? Authorizing Provider  albuterol (VENTOLIN HFA) 108 (90 Base) MCG/ACT inhaler Inhale 2 puffs into the lungs every 6 (six) hours as needed for wheezing or shortness of breath. 01/07/23  Yes [provider]  B Complex-C (B-COMPLEX WITH VITAMIN C) tablet Take 1 tablet by mouth in the morning and at bedtime.   Yes [provider]   Cholecalciferol (VITAMIN D3 PO) Take 1 tablet by mouth daily.   Yes [provider]  gabapentin (NEURONTIN) 300 MG capsule One po qAM, one po qPM and to po qHS Patient taking differently: Take 300-600 mg by mouth See admin instructions. Take 300 mg by mouth in the morning & evening and 600 mg at bedtime 04/26/22  Yes Sater, Pearletha Furl, MD  levofloxacin (LEVAQUIN) 500 MG tablet Take 500 mg by mouth daily. 01/07/23 01/14/23 Yes [provider]  Multiple Vitamin (MULTIVITAMIN WITH MINERALS) TABS tablet Take 1 tablet by mouth daily.    Yes [provider]  polycarbophil (FIBERCON) 625 MG tablet Take 625 mg by mouth in the morning and at bedtime.    Yes [provider]  Probiotic Product (PROBIOTIC PO) Take 1 tablet by mouth daily.   Yes [provider]  TRELEGY ELLIPTA 100-62.5-25 MCG/ACT AEPB Inhale 1 puff into the lungs daily.   Yes [provider]  docusate sodium (COLACE) 100 MG capsule Take 1 capsule (100 mg total) by mouth 2 (two) times daily. Patient not taking: Reported on 01/07/2023 12/08/19   Tressie Stalker, MD  methocarbamol (ROBAXIN) 500 MG tablet Take 1 tablet (500 mg total) by mouth every 6 (six) hours as needed for muscle spasms. Patient not taking: Reported on 01/07/2023 12/08/19   Tressie Stalker, MD      Cyril Mourning MD. [facility]. [facility] Pager : [phone]  If no response to pager , please call [phone] until 7 pm After 7:00 pm call Elink  [phone]   01/08/2023

## 2023-01-08 NOTE — Progress Notes (Addendum)
    Patient Name: Jeffrey [name]           DOB: August 12, 1946  MRN: [id number]      Admission Date: 01/07/2023  Attending Provider: Carollee Herter, DO  Primary Diagnosis: CAP (community acquired pneumonia)   Level of care: Stepdown    CROSS COVER NOTE   Date of Service   01/08/2023   Jeffrey [name], 77 y.o. male, was admitted on 01/07/2023 for CAP (community acquired pneumonia).    HPI/Events of Note   Notified by nursing of unwitnessed fall. Fall occurred while patient was attempting to ambulate independently to bathroom.  Patient reports sliding down against the wall. Patient denies head injury, pain, or loss of consciousness.  RN reports patient is A/O x 4. There no visible sign of injury or deformities.  Skin is intact. ROM at baseline.  No further investigative work-up necessary at this time.   Nursing staff to continue monitoring for change in acute symptoms, mobility, and pain.    Interventions/ Plan   Reinforced safety measures with patient Fall risk, nursing staff to assist with mobility.        Anthoney Harada, DNP, ACNPC- AG [facility]

## 2023-01-08 NOTE — Hospital Course (Signed)
76yo with h/o neuropathy, pulmonary fibrosis, remote empyema s/p VATS (2009), and class 2 obesity presenting with 12 days of worsening cough, progressive SOB.  CXR at PCP office with RLL PNA, given Levaquin.  He took 1 dose, his pulse ox dropped to 85%, and he called 911.  COVID/flu/RSV negative.  CTA with patchy RLL infiltrate.  Started on Rocephin/Azithromycin.  Also with wheezing, given IV Solumedrol -> prednisone.  After admission, he developed worsening respiratory distress that was treated with continuous neb and increased O2 requirement to 12 L so was admitted to SDU.  Strep pneumo antigen positive.  He subsequently attempted to get OOB and fell, no apparent injuries.

## 2023-01-08 NOTE — Progress Notes (Signed)
Progress Note   Patient: Jeffrey [name] [name]:[id number] DOB: [birth year] DOA: 01/07/2023     0 DOS: the patient was seen and examined on 01/08/2023   Brief hospital course: 77yo with h/o neuropathy, pulmonary fibrosis, remote empyema s/p VATS (2009), and class 2 obesity presenting with 12 days of worsening cough, progressive SOB.  CXR at PCP office with RLL PNA, given Levaquin.  He took 1 dose, his pulse ox dropped to 85%, and he called 911.  COVID/flu/RSV negative.  CTA with patchy RLL infiltrate.  Started on Rocephin/Azithromycin.  Also with wheezing, given IV Solumedrol -> prednisone.  After admission, he developed worsening respiratory distress that was treated with continuous neb and increased O2 requirement to 12 L so was admitted to SDU.  Strep pneumo antigen positive.  He subsequently attempted to get OOB and fell, no apparent injuries.  Assessment and Plan: * Pneumococcal pneumonia (HCC) -Patient presenting with productive cough, decreased oxygen saturation, and infiltrate in right lower lobe on chest CT (although on exam it sounds more multifocal in nature) -This appears to be most likely community-acquired pneumonia.  -has reported h/o pulm fibrosis (not seen on CT) and empyema requiring VATS -Influenza negative. -COVID-19 negative. -Strep pneumo positive (reports he was previously vaccinated x 2) -Sputum and blood cultures are pending -Procalcitonin is >>0.5 which indicates more serious disease.  As the procalcitonin level normalizes, it will be reasonable to consider de-escalation of antibiotic coverage.  The sensitivity of procalcitonin is variable and should not be used alone to guide treatment. -CURB-65 score is 3, meaning that the patient has a 14.5% risk of death -Pneumonia Severity Index (PSI) is Class 4, 9% mortality. -Will start Azithromycin 500 mg IV daily and Rocephin due to no risk factors for MDR cause  -Additional complicating factors include: marked hypoxia, requiring  high amounts of [place] O2 -Fever control -Repeat CBC in am -Will add albuterol PRN -Will add standing Duonebs -Will add Mucinex for cough Will start IV solumedrol and changed to po prednisone. -He is admitted to SDU due to high O2 requirement -Pulmonology consult requested  Class 2 obesity due to excess calories with body mass index (BMI) of 37.0 to 37.9 in adult -Body mass index is 37.7 kg/m..  -Weight loss should be encouraged -Outpatient PCP/bariatric medicine f/u encouraged   Polyneuropathy -Follows with [facility] for his neuropathy -Continue gabapentin     Subjective: He feels very fatigued but mildly better today.  Less cough.  He is on 12L [place] O2 with sats in the upper 80s during sleep.  He is concerned about long-term consequences since he previously required VATS for empyema.  He is in agreement with pulm consult.  He reports that he didn't really fall last night, "I guess you could call it that."  No injuries.  Physical Exam: Vitals:   01/08/23 1258 01/08/23 1400 01/08/23 1416 01/08/23 1614  BP:  (!) 114/56  (!) 123/57  Pulse:  87  69  Resp:  16  (!) 24  Temp: 98 F (36.7 C)     TempSrc: Oral     SpO2:  94% 92% 93%  Weight:      Height:       General:  Appears fatigued with O2 sats in upper 80s despite 12L [place] O2 Eyes:   EOMI, normal lids, iris ENT:  grossly normal hearing, lips & tongue, mmm Neck:  no LAD, masses or thyromegaly Cardiovascular:  RRR, no m/r/g. No LE edema.  Respiratory:   Diffuse rhonchi with  expiratory wheezing.  Mildly increased respiratory effort. Abdomen:  soft, NT, ND Skin:  no rash or induration seen on limited exam Musculoskeletal:  grossly normal tone BUE/BLE, good ROM, no bony abnormality Psychiatric:  blunted mood and affect, speech fluent and appropriate, AOx3 Neurologic:  CN 2-12 grossly intact, moves all extremities in coordinated fashion   Radiological Exams on Admission: Independently reviewed - see discussion in A/P  where applicable  CT Angio Chest PE W and/or Wo Contrast  Result Date: 01/07/2023 CLINICAL DATA:  Pulmonary embolism suspected, high probability. Shortness of breath. EXAM: CT ANGIOGRAPHY CHEST WITH CONTRAST TECHNIQUE: Multidetector CT imaging of the chest was performed using the standard protocol during bolus administration of intravenous contrast. Multiplanar CT image reconstructions and MIPs were obtained to evaluate the vascular anatomy. RADIATION DOSE REDUCTION: This exam was performed according to the departmental dose-optimization program which includes automated exposure control, adjustment of the mA and/or kV according to patient size and/or use of iterative reconstruction technique. CONTRAST:  OMNIPAQUE IOHEXOL 350 MG/ML SOLN COMPARISON:  09/07/2012. FINDINGS: Cardiovascular: The heart is normal in size and there is no pericardial effusion. Multi-vessel coronary artery calcifications are noted. There is atherosclerotic calcification of the aorta without evidence of aneurysm. Pulmonary trunk is normal in caliber. No definite evidence of pulmonary embolism. Mediastinum/Nodes: Enlarged lymph nodes are noted in the mediastinum measuring up to 1 point 4 cm in the left paratracheal space. No hilar or axillary lymphadenopathy. The thyroid gland, trachea, and esophagus are within normal limits. Lungs/Pleura: Bronchial wall thickening is present bilaterally. A few scattered ground-glass opacities are present bilaterally. Patchy atelectasis or infiltrate is noted in the right lower lobe. Scattered atelectasis or scarring is present bilaterally. No effusion or pneumothorax. Upper Abdomen: There is a cyst in the posterior right lobe of the liver. Fatty infiltration of the liver is noted. A cyst is present in the left kidney. No adrenal nodule. No acute abnormality. Musculoskeletal: Cervical spinal fusion hardware is noted. No acute osseous abnormality is seen. Review of the MIP images confirms the above  findings. IMPRESSION: 1. No evidence of pulmonary embolism. 2. Bronchial wall thickening bilaterally with patchy atelectasis or infiltrate in the right lower lobe. 3. Hepatic steatosis. 4. Aortic atherosclerosis and coronary artery calcifications. Electronically Signed   By: Thornell Sartorius M.D.   On: 01/07/2023 21:13   DG Chest Port 1 View  Result Date: 01/07/2023 CLINICAL DATA:  Shortness of breath EXAM: PORTABLE CHEST 1 VIEW COMPARISON:  Chest x-ray 11/06/2008 FINDINGS: The heart size and mediastinal contours are within normal limits. Both lungs are clear. There are no acute fractures. Cervical spinal fusion plate is present. IMPRESSION: No active disease. Electronically Signed   By: Darliss Cheney M.D.   On: 01/07/2023 17:48    EKG: Independently reviewed.  NSR with rate 69; no evidence of acute ischemia   Labs on Admission: I have personally reviewed the available labs and imaging studies at the time of the admission.  Pertinent labs:    ABG on simple mask with 9L O2 WNL Glucose 177, 167 Procalcitonin 1.95 WBC 14.1 Hgb 11.1 Strep pneumo positive MRSA negative COVID/flu/RSV negative  Family Communication: None present; I spoke with his wife by telephone shortly after seeing the patient this AM  Disposition: Status is: Admit - It is my clinical opinion that admission to INPATIENT is reasonable and necessary because of the expectation that this patient will require hospital care that crosses at least 2 midnights to treat this condition based on the medical  complexity of the problems presented.  Given the aforementioned information, the predictability of an adverse outcome is felt to be significant.   Planned Discharge Destination: Home    Time spent: 50 minutes  Author: Jonah Blue, MD 01/08/2023 4:38 PM  For on call review [email or web address].

## 2023-01-09 DIAGNOSIS — J13 Pneumonia due to Streptococcus pneumoniae: Secondary | ICD-10-CM | POA: Diagnosis not present

## 2023-01-09 DIAGNOSIS — J9601 Acute respiratory failure with hypoxia: Secondary | ICD-10-CM | POA: Diagnosis not present

## 2023-01-09 LAB — BASIC METABOLIC PANEL
Anion gap: 10 (ref 5–15)
BUN: 27 mg/dL — ABNORMAL HIGH (ref 8–23)
CO2: 23 mmol/L (ref 22–32)
Calcium: 8.3 mg/dL — ABNORMAL LOW (ref 8.9–10.3)
Chloride: 103 mmol/L (ref 98–111)
Creatinine, Ser: 1.2 mg/dL (ref 0.61–1.24)
GFR, Estimated: >60 mL/min (ref >60)
Glucose, Bld: 152 mg/dL — ABNORMAL HIGH (ref 70–99)
Potassium: 4.5 mmol/L (ref 3.5–5.1)
Sodium: 136 mmol/L (ref 135–145)

## 2023-01-09 LAB — CBC WITH DIFFERENTIAL/PLATELET
Abs Immature Granulocytes: 0.15 10*3/uL — ABNORMAL HIGH (ref 0.00–0.07)
Basophils Absolute: 0 10*3/uL (ref 0.0–0.1)
Basophils Relative: 0 %
Eosinophils Absolute: 0 10*3/uL (ref 0.0–0.5)
Eosinophils Relative: 0 %
HCT: 35.9 % — ABNORMAL LOW (ref 39.0–52.0)
Hemoglobin: 11.4 g/dL — ABNORMAL LOW (ref 13.0–17.0)
Immature Granulocytes: 1 %
Lymphocytes Relative: 3 %
Lymphs Abs: 0.5 10*3/uL — ABNORMAL LOW (ref 0.7–4.0)
MCH: 31.4 pg (ref 26.0–34.0)
MCHC: 31.8 g/dL (ref 30.0–36.0)
MCV: 98.9 fL (ref 80.0–100.0)
Monocytes Absolute: 1.3 10*3/uL — ABNORMAL HIGH (ref 0.1–1.0)
Monocytes Relative: 7 %
Neutro Abs: 16.9 10*3/uL — ABNORMAL HIGH (ref 1.7–7.7)
Neutrophils Relative %: 89 %
Platelets: 217 10*3/uL (ref 150–400)
RBC: 3.63 MIL/uL — ABNORMAL LOW (ref 4.22–5.81)
RDW: 14.3 % (ref 11.5–15.5)
WBC: 18.8 10*3/uL — ABNORMAL HIGH (ref 4.0–10.5)
nRBC: 0 % (ref 0.0–0.2)

## 2023-01-09 LAB — PROCALCITONIN: Procalcitonin: 1.9 ng/mL

## 2023-01-09 MED ORDER — TRAZODONE HCL 50 MG PO TABS
25.0000 mg | ORAL_TABLET | Freq: Every evening | ORAL | Status: DC | PRN
  Administered 2023-01-09: 25 mg via ORAL
  Filled 2023-01-09: qty 1

## 2023-01-09 MED ORDER — DOCUSATE SODIUM 100 MG PO CAPS
100.0000 mg | ORAL_CAPSULE | Freq: Two times a day (BID) | ORAL | Status: DC
  Administered 2023-01-09 – 2023-01-11 (×6): 100 mg via ORAL
  Filled 2023-01-09 (×10): qty 1

## 2023-01-09 MED ORDER — POLYETHYLENE GLYCOL 3350 17 G PO PACK: 17.0000 g | PACK | Freq: Every day | ORAL | Status: DC | PRN

## 2023-01-09 MED ORDER — LORATADINE 10 MG PO TABS
10.0000 mg | ORAL_TABLET | Freq: Every day | ORAL | Status: DC
  Administered 2023-01-09 – 2023-01-12 (×4): 10 mg via ORAL
  Filled 2023-01-09 (×5): qty 1

## 2023-01-09 MED ORDER — FLUTICASONE PROPIONATE 50 MCG/ACT NA SUSP
2.0000 | Freq: Every day | NASAL | Status: DC
  Administered 2023-01-09 – 2023-01-14 (×6): 2 via NASAL
  Filled 2023-01-09: qty 16

## 2023-01-09 NOTE — Progress Notes (Signed)
PROGRESS NOTE    Jeffrey [name]  [name]:[id number] DOB: 1946-07-03 DOA: 01/07/2023 PCP: [name]., MD    Brief Narrative:   Jeffrey [name] is a 77 y.o. male with past medical history significant for neuropathy, remote empyema s/p VATS 2009, cervical fusion, obesity who presented to [facility] ED on 01/07/2023 via EMS from home with progressive shortness of breath.  He was initially seen by his PCP on same day of ED presentation and diagnosed with pneumonia with chest x-ray concerning for right lower lobe infiltrate in which he was started on Levaquin.  He reports taking his first dose.  He then noted that his oxygen saturation was low and EMS was activated and patient was transported to the ED for further evaluation.  In the ED, temperature 99.0 F, HR 125, SpO2 82% on room air.  WBC 11.2, hemoglobin 11.9, platelets 212.  Sodium 134, potassium 3.6, chloride 101, CO2 22, glucose 167, BUN 18, creatinine 1.18.  AST 30, ALT 30, total bilirubin 0.8.  BNP 21.8.  Lactic acid 1.2.  Procalcitonin 1.95.  Patient was started on azithromycin and ceftriaxone.  EDP consulted [facility] for admission for further evaluation management of acute hypoxic respiratory failure secondary to pneumonia.  5/10: Admit, started on azithromycin/ceftriaxone; transfer to SDU for worsening respiratory status 5/11: PCCM consult, strep pneumo antigen +, azithromycin and steroids discontinued; remains on ceftriaxone 5/12: Oxygen requirements improving, down to 10 L HFNC  Assessment & Plan:   Pneumococcal pneumonia; RLL Patient presenting to ED with 12-day history of progressive shortness of breath, cough.  Seen by PCP and started on Levaquin although had significant desaturation outpatient which led to ED presentation.  Patient was afebrile but with elevated WBC count of 11.2.  Noted SpO2 82% on room air on arrival.  Strep pneumo antigen positive.  COVID-19/influenza PCR negative.  BNP within normal limits.  CT angiogram  chest negative for pulmonary embolism with infiltrates right lower lobe.  -- PCCM following; appreciate assistance -- Ceftriaxone 2 g IV every 24 hours -- Flonase/Claritin -- DuoNeb 3 times daily -- Albuterol neb every 2 hours as needed wheezing/SOB -- Incentive spirometry/flutter valve -- Continue supplemental oxygen, maintain SpO2 greater than 92%, down to 10L HFNC this am -- Repeat chest x-ray in the a.m.  Neuropathy -- Gabapentin 300 mg p.o. twice daily, 600 mg p.o. nightly  Questional history of pulmonary fibrosis No evidence of ILD noted on CT angiogram chest. --Outpatient follow-up with pulmonology  Obesity Body mass index is 37.64 kg/m.  Discussed with patient needs for aggressive lifestyle changes/weight loss as this complicates all facets of care.  Outpatient follow-up with PCP.     DVT prophylaxis: SCDs Start: 01/07/23 2356    Code Status: Full Code Family Communication: No family present at bedside this morning  Disposition Plan:  Level of care: Stepdown Status is: Inpatient Remains inpatient appropriate because: IV antibiotics, high O2 requirements    Consultants:  PCCM  Procedures:  None  Antimicrobials:  Azithromycin 5/10 - 5/10 Ceftriaxone 5/10>>   Subjective: Patient seen examined bedside, resting comfortably.  Lying in bed.  Reports show breath slightly improved.  Oxygen needs down to 10 L high flow nasal cannula from 12 L yesterday.  Discussed with patient will start Flonase/Claritin for reported congestion.  Also instructed that he will need to utilize the incentive spirometer and flutter valve.  Also discussed need for increased mobilization, out of bed to chair today.  Seen by PCCM this morning.  No  other specific complaints or concerns at this time.  Denies headache, no dizziness, no chest pain, no palpitations, no abdominal pain, no fever/chills/night sweats, no nausea/vomiting/diarrhea, no focal weakness, no fatigue, no cough, no paresthesias.  No  acute events overnight per nursing staff.  Objective: Vitals:   01/09/23 0400 01/09/23 0500 01/09/23 0800 01/09/23 0827  BP: 133/65  (!) 128/46   Pulse: 67  75   Resp: (!) 25  (!) 22   Temp: 97.8 F (36.6 C)   97.8 F (36.6 C)  TempSrc: Oral   Oral  SpO2: 92%  96%   Weight:  122.4 kg    Height:        Intake/Output Summary (Last 24 hours) at 01/09/2023 1123 Last data filed at 01/09/2023 0800 Gross per 24 hour  Intake 640 ml  Output 1050 ml  Net -410 ml   Filed Weights   01/07/23 1647 01/08/23 0156 01/09/23 0500  Weight: 125.2 kg 122.6 kg 122.4 kg    Examination:  Physical Exam: GEN: NAD, alert and oriented x 3, obese HEENT: NCAT, PERRL, EOMI, sclera clear, MMM PULM: Crackles noted right base, no wheezing, normal respiratory effort without accessory muscle use, on 10 L HFNC with SpO2 96% CV: RRR w/o M/G/R GI: abd soft, NTND, NABS, no R/G/M MSK: no peripheral edema, muscle strength globally intact 5/5 bilateral upper/lower extremities NEURO: CN II-XII intact, no focal deficits, sensation to light touch intact PSYCH: normal mood/affect Integumentary: dry/intact, no rashes or wounds    Data Reviewed: I have personally reviewed following labs and imaging studies  CBC: Recent Labs  Lab 01/07/23 1700 01/08/23 0254 01/09/23 0257  WBC 11.2* 14.1* 18.8*  NEUTROABS 9.5* 13.1* 16.9*  HGB 11.9* 11.1* 11.4*  HCT 35.9* 34.6* 35.9*  MCV 95.5 96.9 98.9  PLT 212 208 217   Basic Metabolic Panel: Recent Labs  Lab 01/07/23 1700 01/08/23 0254 01/09/23 0257  NA 134* 134* 136  K 3.6 4.1 4.5  CL 101 100 103  CO2 22 23 23   GLUCOSE 167* 177* 152*  BUN 18 19 27*  CREATININE 1.18 1.19 1.20  CALCIUM 8.5* 8.1* 8.3*  MG  --  1.7  --    GFR: Estimated Creatinine Clearance: 71.9 mL/min (by C-G formula based on SCr of 1.2 mg/dL). Liver Function Tests: Recent Labs  Lab 01/07/23 1700 01/08/23 0254  AST 30 30  ALT 30 28  ALKPHOS 37* 31*  BILITOT 0.8 0.6  PROT 7.6 7.1   ALBUMIN 4.0 3.8   No results for input(s): "LIPASE", "AMYLASE" in the last 168 hours. No results for input(s): "AMMONIA" in the last 168 hours. Coagulation Profile: No results for input(s): "INR", "PROTIME" in the last 168 hours. Cardiac Enzymes: No results for input(s): "CKTOTAL", "CKMB", "CKMBINDEX", "TROPONINI" in the last 168 hours. BNP (last 3 results) No results for input(s): "PROBNP" in the last 8760 hours. HbA1C: No results for input(s): "HGBA1C" in the last 72 hours. CBG: No results for input(s): "GLUCAP" in the last 168 hours. Lipid Profile: No results for input(s): "CHOL", "HDL", "LDLCALC", "TRIG", "CHOLHDL", "LDLDIRECT" in the last 72 hours. Thyroid Function Tests: No results for input(s): "TSH", "T4TOTAL", "FREET4", "T3FREE", "THYROIDAB" in the last 72 hours. Anemia Panel: No results for input(s): "VITAMINB12", "FOLATE", "FERRITIN", "TIBC", "IRON", "RETICCTPCT" in the last 72 hours. Sepsis Labs: Recent Labs  Lab 01/08/23 0254 01/08/23 0746 01/09/23 0257  PROCALCITON 1.95 2.09 1.90  LATICACIDVEN  --  1.2  --     Recent Results (from the past  240 hour(s))  Resp panel by RT-PCR (RSV, Flu A&B, Covid) Anterior Nasal Swab     Status: None   Collection Time: 01/07/23  5:00 PM   Specimen: Anterior Nasal Swab  Result Value Ref Range Status   SARS Coronavirus 2 by RT PCR NEGATIVE NEGATIVE Final    Comment: (NOTE) SARS-CoV-2 target nucleic acids are NOT DETECTED.  The SARS-CoV-2 RNA is generally detectable in upper respiratory specimens during the acute phase of infection. The lowest concentration of SARS-CoV-2 viral copies this assay can detect is 138 copies/mL. A negative result does not preclude SARS-Cov-2 infection and should not be used as the sole basis for treatment or other patient management decisions. A negative result may occur with  improper specimen collection/handling, submission of specimen other than nasopharyngeal swab, presence of viral mutation(s)  within the areas targeted by this assay, and inadequate number of viral copies(<138 copies/mL). A negative result must be combined with clinical observations, patient history, and epidemiological information. The expected result is Negative.  Fact Sheet for Patients:  BloggerCourse.com  Fact Sheet for Healthcare Providers:  SeriousBroker.it  This test is no t yet approved or cleared by the [place] FDA and  has been authorized for detection and/or diagnosis of SARS-CoV-2 by FDA under an Emergency Use Authorization (EUA). This EUA will remain  in effect (meaning this test can be used) for the duration of the COVID-19 declaration under Section 564(b)(1) of the Act, 21 U.S.C.section 360bbb-3(b)(1), unless the authorization is terminated  or revoked sooner.       Influenza A by PCR NEGATIVE NEGATIVE Final   Influenza B by PCR NEGATIVE NEGATIVE Final    Comment: (NOTE) The Xpert Xpress SARS-CoV-2/FLU/RSV plus assay is intended as an aid in the diagnosis of influenza from Nasopharyngeal swab specimens and should not be used as a sole basis for treatment. Nasal washings and aspirates are unacceptable for Xpert Xpress SARS-CoV-2/FLU/RSV testing.  Fact Sheet for Patients: BloggerCourse.com  Fact Sheet for Healthcare Providers: SeriousBroker.it  This test is not yet approved or cleared by the [place] FDA and has been authorized for detection and/or diagnosis of SARS-CoV-2 by FDA under an Emergency Use Authorization (EUA). This EUA will remain in effect (meaning this test can be used) for the duration of the COVID-19 declaration under Section 564(b)(1) of the Act, 21 U.S.C. section 360bbb-3(b)(1), unless the authorization is terminated or revoked.     Resp Syncytial Virus by PCR NEGATIVE NEGATIVE Final    Comment: (NOTE) Fact Sheet for  Patients: BloggerCourse.com  Fact Sheet for Healthcare Providers: SeriousBroker.it  This test is not yet approved or cleared by the [place] FDA and has been authorized for detection and/or diagnosis of SARS-CoV-2 by FDA under an Emergency Use Authorization (EUA). This EUA will remain in effect (meaning this test can be used) for the duration of the COVID-19 declaration under Section 564(b)(1) of the Act, 21 U.S.C. section 360bbb-3(b)(1), unless the authorization is terminated or revoked.  Performed at [facility], [address], [place], [place] [address]   MRSA Next Gen by PCR, Nasal     Status: None   Collection Time: 01/08/23  2:08 AM   Specimen: Nasal Mucosa; Nasal Swab  Result Value Ref Range Status   MRSA by PCR Next Gen NOT DETECTED NOT DETECTED Final    Comment: (NOTE) The GeneXpert MRSA Assay (FDA approved for NASAL specimens only), is one component of a comprehensive MRSA colonization surveillance program. It is not intended to diagnose  MRSA infection nor to guide or monitor treatment for MRSA infections. Test performance is not FDA approved in patients less than 50 years old. Performed at [facility], [address] [place], [place] [address]          Radiology Studies: CT Angio Chest PE W and/or Wo Contrast  Result Date: 01/07/2023 CLINICAL DATA:  Pulmonary embolism suspected, high probability. Shortness of breath. EXAM: CT ANGIOGRAPHY CHEST WITH CONTRAST TECHNIQUE: Multidetector CT imaging of the chest was performed using the standard protocol during bolus administration of intravenous contrast. Multiplanar CT image reconstructions and MIPs were obtained to evaluate the vascular anatomy. RADIATION DOSE REDUCTION: This exam was performed according to the departmental dose-optimization program which includes automated exposure control, adjustment of the mA and/or kV  according to patient size and/or use of iterative reconstruction technique. CONTRAST:  OMNIPAQUE IOHEXOL 350 MG/ML SOLN COMPARISON:  09/07/2012. FINDINGS: Cardiovascular: The heart is normal in size and there is no pericardial effusion. Multi-vessel coronary artery calcifications are noted. There is atherosclerotic calcification of the aorta without evidence of aneurysm. Pulmonary trunk is normal in caliber. No definite evidence of pulmonary embolism. Mediastinum/Nodes: Enlarged lymph nodes are noted in the mediastinum measuring up to 1 point 4 cm in the left paratracheal space. No hilar or axillary lymphadenopathy. The thyroid gland, trachea, and esophagus are within normal limits. Lungs/Pleura: Bronchial wall thickening is present bilaterally. A few scattered ground-glass opacities are present bilaterally. Patchy atelectasis or infiltrate is noted in the right lower lobe. Scattered atelectasis or scarring is present bilaterally. No effusion or pneumothorax. Upper Abdomen: There is a cyst in the posterior right lobe of the liver. Fatty infiltration of the liver is noted. A cyst is present in the left kidney. No adrenal nodule. No acute abnormality. Musculoskeletal: Cervical spinal fusion hardware is noted. No acute osseous abnormality is seen. Review of the MIP images confirms the above findings. IMPRESSION: 1. No evidence of pulmonary embolism. 2. Bronchial wall thickening bilaterally with patchy atelectasis or infiltrate in the right lower lobe. 3. Hepatic steatosis. 4. Aortic atherosclerosis and coronary artery calcifications. Electronically Signed   By: Thornell Sartorius M.D.   On: 01/07/2023 21:13   DG Chest Port 1 View  Result Date: 01/07/2023 CLINICAL DATA:  Shortness of breath EXAM: PORTABLE CHEST 1 VIEW COMPARISON:  Chest x-ray 11/06/2008 FINDINGS: The heart size and mediastinal contours are within normal limits. Both lungs are clear. There are no acute fractures. Cervical spinal fusion plate is  present. IMPRESSION: No active disease. Electronically Signed   By: Darliss Cheney M.D.   On: 01/07/2023 17:48        Scheduled Meds:  Chlorhexidine Gluconate Cloth  6 each Topical Daily   docusate sodium  100 mg Oral BID   enoxaparin (LOVENOX) injection  60 mg Subcutaneous Q24H   fluticasone  2 spray Each Nare Daily   gabapentin  300 mg Oral BID WC   gabapentin  600 mg Oral QHS   ipratropium-albuterol  3 mL Nebulization TID   loratadine  10 mg Oral Daily   Continuous Infusions:  cefTRIAXone (ROCEPHIN)  IV Stopped (01/08/23 2248)     LOS: 1 day    Time spent: 52 minutes spent on chart review, discussion with nursing staff, consultants, updating family and interview/physical exam; more than 50% of that time was spent in counseling and/or coordination of care.    Jeffrey Philips [place], DO [facility] Available via Epic secure chat 7am-7pm After these hours, please refer to  coverage provider listed on [email or web address] 01/09/2023, 11:23 AM

## 2023-01-09 NOTE — Progress Notes (Signed)
NAME:  Jeffrey [name], MRN:  [id number], DOB:  12-14-1945, LOS: 1 ADMISSION DATE:  01/07/2023, CONSULTATION DATE:  01/09/2023  REFERRING MD:  Myrene Buddy, CHIEF COMPLAINT: Respiratory distress, on high flow oxygen  History of Present Illness:  77 year old obese never smoker was admitted via his PCP office on 5/9 where he presented with 12 days of cough.  Chest x-ray showed right lower lobe pneumonia.  Oxygen saturation was between 89 to 91%.  When he got home and saturations dropped further, he called EMS In the ED was noted to be wheezing despite continuous albuterol neb. He was placed on oxygen and by this morning is requiring 10 to 12 L high flow nasal cannula hence PCCM consulted CT angiogram chest showed scattered groundglass opacities bilateral, patchy atelectasis or infiltrate in the right lower lobe Of note, CT chest from 2014 shows minimal scarring right lower lobe attributed to previous empyema  Labs showed mild leukocytosis, normal BNP, COVID flu RSV testing was negative, urine strep antigen was positive Pertinent  Medical History  Empyema requiring VATS 07/2008 Cervical fusion    Significant Hospital Events: Including procedures, antibiotic start and stop dates in addition to other pertinent events     Interim History / Subjective:   Mild improvement in dyspnea. On 10 L high flow nasal cannula  Objective   Blood pressure (!) 128/46, pulse 75, temperature 97.8 F (36.6 C), temperature source Oral, resp. rate (!) 22, height 5\' 11"  (1.803 m), weight 122.4 kg, SpO2 96 %.        Intake/Output Summary (Last 24 hours) at 01/09/2023 1112 Last data filed at 01/09/2023 0800 Gross per 24 hour  Intake 640 ml  Output 1050 ml  Net -410 ml    Filed Weights   01/07/23 1647 01/08/23 0156 01/09/23 0500  Weight: 125.2 kg 122.6 kg 122.4 kg    Examination: General: Obese man, lying with head of bed elevated, no distress HENT: No pallor, icterus, no JVD Lungs: No accessory muscle  use, crackles right base, no rhonchi Cardiovascular: S1 and S2 regular, no murmur Abdomen: Obese, soft, nontender Extremities: No edema, no deformity Neuro: Alert, interactive, nonfocal  Labs show improved sodium, stable BUN/creatinine 27/1.2, normal electrolytes, lactate 1.2, slight high procalcitonin, increased leukocytosis   Resolved Hospital Problem list     Assessment & Plan:  Acute hypoxic respiratory failure Pneumococcal pneumonia CT angiogram does not show any evidence of pulmonary fibrosis.  Right lower lobe scarring from past related to history of empyema  -Continue oxygen by salter nasal cannula, decrease maintaining oxygen saturation 92% Amao -Continue ceftriaxone, can DC azithromycin -Chest x-ray tomorrow -Started on steroids for wheezing on admission, likely cause of leukocytosis  PCP notes mention pulmonary fibrosis and referral to pulmonary was planned-but no evidence of ILD on CT Can obtain outpatient follow-up. PCCM will sign off if oxygen requirements continue to decrease   Best Practice (right click and "Reselect all SmartList Selections" daily)   Per TR[facility] Code Status:  full code Last date of multidisciplinary goals of care discussion [per TRH]  Labs   CBC: Recent Labs  Lab 01/07/23 1700 01/08/23 0254 01/09/23 0257  WBC 11.2* 14.1* 18.8*  NEUTROABS 9.5* 13.1* 16.9*  HGB 11.9* 11.1* 11.4*  HCT 35.9* 34.6* 35.9*  MCV 95.5 96.9 98.9  PLT 212 208 217     Basic Metabolic Panel: Recent Labs  Lab 01/07/23 1700 01/08/23 0254 01/09/23 0257  NA 134* 134* 136  K 3.6 4.1 4.5  CL 101 100 103  CO2 22 23 23   GLUCOSE 167* 177* 152*  BUN 18 19 27*  CREATININE 1.18 1.19 1.20  CALCIUM 8.5* 8.1* 8.3*  MG  --  1.7  --     GFR: Estimated Creatinine Clearance: 71.9 mL/min (by C-G formula based on SCr of 1.2 mg/dL). Recent Labs  Lab 01/07/23 1700 01/08/23 0254 01/08/23 0746 01/09/23 0257  PROCALCITON  --  1.95 2.09 1.90  WBC 11.2* 14.1*  --  18.8*   LATICACIDVEN  --   --  1.2  --      Liver Function Tests: Recent Labs  Lab 01/07/23 1700 01/08/23 0254  AST 30 30  ALT 30 28  ALKPHOS 37* 31*  BILITOT 0.8 0.6  PROT 7.6 7.1  ALBUMIN 4.0 3.8    No results for input(s): "LIPASE", "AMYLASE" in the last 168 hours. No results for input(s): "AMMONIA" in the last 168 hours.  ABG    Component Value Date/Time   PHART 7.36 01/08/2023 0125   PCO2ART 45 01/08/2023 0125   PO2ART 125 (H) 01/08/2023 0125   HCO3 25.4 01/08/2023 0125   TCO2 25 08/16/2008 0405   ACIDBASEDEF 0.3 01/08/2023 0125   O2SAT 98.7 01/08/2023 0125     Coagulation Profile: No results for input(s): "INR", "PROTIME" in the last 168 hours.  Cardiac Enzymes: No results for input(s): "CKTOTAL", "CKMB", "CKMBINDEX", "TROPONINI" in the last 168 hours.  HbA1C: Hgb A1c MFr Bld  Date/Time Value Ref Range Status  08/12/2008 05:09 AM  4.6 - 6.1 % Final   5.5 (NOTE)   The ADA recommends the following therapeutic goal for glycemic   control related to Hgb A1C measurement:   Goal of Therapy:   < 7.0% Hgb A1C   Reference: American Diabetes Association: Clinical Practice   Recommendations 2008, Diabetes Care,  2008, 31:(Suppl 1).    CBG: No results for input(s): "GLUCAP" in the last 168 hours.    Cyril Mourning MD. Tonny Bollman. [facility] Pager : [phone]  If no response to pager , please call [phone] until 7 pm After 7:00 pm call Elink  [phone]   01/09/2023

## 2023-01-10 ENCOUNTER — Encounter (HOSPITAL_COMMUNITY): Payer: Self-pay | Admitting: Internal Medicine

## 2023-01-10 ENCOUNTER — Inpatient Hospital Stay (HOSPITAL_COMMUNITY): Payer: Medicare Other

## 2023-01-10 DIAGNOSIS — J9601 Acute respiratory failure with hypoxia: Secondary | ICD-10-CM | POA: Diagnosis not present

## 2023-01-10 DIAGNOSIS — J13 Pneumonia due to Streptococcus pneumoniae: Secondary | ICD-10-CM | POA: Diagnosis not present

## 2023-01-10 LAB — BASIC METABOLIC PANEL
Anion gap: 10 (ref 5–15)
BUN: 29 mg/dL — ABNORMAL HIGH (ref 8–23)
CO2: 26 mmol/L (ref 22–32)
Calcium: 8.3 mg/dL — ABNORMAL LOW (ref 8.9–10.3)
Chloride: 102 mmol/L (ref 98–111)
Creatinine, Ser: 1.14 mg/dL (ref 0.61–1.24)
GFR, Estimated: >60 mL/min (ref >60)
Glucose, Bld: 114 mg/dL — ABNORMAL HIGH (ref 70–99)
Potassium: 4.3 mmol/L (ref 3.5–5.1)
Sodium: 138 mmol/L (ref 135–145)

## 2023-01-10 LAB — CBC
HCT: 35.7 % — ABNORMAL LOW (ref 39.0–52.0)
Hemoglobin: 11.3 g/dL — ABNORMAL LOW (ref 13.0–17.0)
MCH: 31.7 pg (ref 26.0–34.0)
MCHC: 31.7 g/dL (ref 30.0–36.0)
MCV: 100.3 fL — ABNORMAL HIGH (ref 80.0–100.0)
Platelets: 247 10*3/uL (ref 150–400)
RBC: 3.56 MIL/uL — ABNORMAL LOW (ref 4.22–5.81)
RDW: 14.6 % (ref 11.5–15.5)
WBC: 9.7 10*3/uL (ref 4.0–10.5)
nRBC: 0 % (ref 0.0–0.2)

## 2023-01-10 LAB — LEGIONELLA PNEUMOPHILA SEROGP 1 UR AG: L. pneumophila Serogp 1 Ur Ag: NEGATIVE

## 2023-01-10 LAB — PROCALCITONIN: Procalcitonin: 1.12 ng/mL

## 2023-01-10 MED ORDER — REVEFENACIN 175 MCG/3ML IN SOLN
175.0000 ug | Freq: Every day | RESPIRATORY_TRACT | Status: DC
  Administered 2023-01-10 – 2023-01-14 (×5): 175 ug via RESPIRATORY_TRACT
  Filled 2023-01-10 (×5): qty 3

## 2023-01-10 MED ORDER — TRAZODONE HCL 100 MG PO TABS
100.0000 mg | ORAL_TABLET | Freq: Every evening | ORAL | Status: DC | PRN
  Administered 2023-01-10: 100 mg via ORAL
  Filled 2023-01-10: qty 1

## 2023-01-10 MED ORDER — ARFORMOTEROL TARTRATE 15 MCG/2ML IN NEBU
15.0000 ug | INHALATION_SOLUTION | Freq: Two times a day (BID) | RESPIRATORY_TRACT | Status: DC
  Administered 2023-01-10 – 2023-01-14 (×8): 15 ug via RESPIRATORY_TRACT
  Filled 2023-01-10 (×7): qty 2

## 2023-01-10 MED ORDER — BUDESONIDE 0.5 MG/2ML IN SUSP
0.5000 mg | Freq: Two times a day (BID) | RESPIRATORY_TRACT | Status: DC
  Administered 2023-01-10 – 2023-01-14 (×8): 0.5 mg via RESPIRATORY_TRACT
  Filled 2023-01-10 (×8): qty 2

## 2023-01-10 MED ORDER — MELATONIN 5 MG PO TABS
5.0000 mg | ORAL_TABLET | Freq: Every day | ORAL | Status: DC
  Administered 2023-01-10: 5 mg via ORAL
  Filled 2023-01-10: qty 1

## 2023-01-10 MED ORDER — HYDRALAZINE HCL 25 MG PO TABS: 25.0000 mg | ORAL_TABLET | Freq: Four times a day (QID) | ORAL | Status: DC | PRN

## 2023-01-10 MED ORDER — HYDROXYZINE HCL 10 MG PO TABS
10.0000 mg | ORAL_TABLET | Freq: Once | ORAL | Status: AC | PRN
  Administered 2023-01-10: 10 mg via ORAL
  Filled 2023-01-10: qty 1

## 2023-01-10 NOTE — Evaluation (Signed)
Occupational Therapy Evaluation Patient Details Name: Jeffrey [name] MRN: [id number] DOB: 12-28-1945 Today's Date: 01/10/2023   History of Present Illness Jeffrey [name] is a 77 y.o. male with past medical history significant for neuropathy, remote empyema s/p VATS 2009, cervical fusion, obesity who presented to [facility] ED on 01/07/2023 via EMS from home with progressive shortness of breath.  He was initially seen by his PCP on same day of ED presentation and diagnosed with pneumonia with chest x-ray concerning for right lower lobe infiltrate i   Clinical Impression   Patient is a 77 year old male who was admitted for above. Patient was living at home with wife independently. Wife recently had knee replacement surgery. Patients evaluation was limited with lethargy on this date. Currently, patient is noted to have poor functional activity tolerance, increased need for supplemental O2,  and decreased knowledge on AD/AE impacting participation in ADLs. Patient would continue to benefit from skilled OT services at this time while admitted and after d/c to address noted deficits in order to improve overall safety and independence in ADLs.       Recommendations for follow up therapy are one component of a multi-disciplinary discharge planning process, led by the attending physician.  Recommendations may be updated based on patient status, additional functional criteria and insurance authorization.   Assistance Recommended at Discharge Frequent or constant Supervision/Assistance  Patient can return home with the following A little help with walking and/or transfers;Assistance with cooking/housework;Direct supervision/assist for medications management;Assist for transportation;Help with stairs or ramp for entrance;Direct supervision/assist for financial management;A little help with bathing/dressing/bathroom    Functional Status Assessment  Patient has had a recent decline in their functional  status and demonstrates the ability to make significant improvements in function in a reasonable and predictable amount of time.  Equipment Recommendations  Other (comment) (total hip kit)       Precautions / Restrictions Precautions Precautions: Fall Precaution Comments: monitor HR/SPO2 Restrictions Weight Bearing Restrictions: No      Mobility Bed Mobility       General bed mobility comments: patient was up in recliner and returned to the same             Balance Overall balance assessment: Mild deficits observed, not formally tested         ADL either performed or assessed with clinical judgement   ADL Overall ADL's : Needs assistance/impaired Eating/Feeding: Modified independent;Sitting   Grooming: Modified independent;Sitting   Upper Body Bathing: Sitting;Min guard   Lower Body Bathing: Sitting/lateral leans;Sit to/from stand;Minimal assistance Lower Body Bathing Details (indicate cue type and reason): patient is unable to complete figure four but able to reach toes. noted to have significant SOB with leaning over. Upper Body Dressing : Min guard;Sitting   Lower Body Dressing: Minimal assistance;Sitting/lateral leans Lower Body Dressing Details (indicate cue type and reason): unable to complete figure four positioning. bends over to get bilateral socks on and off with increased SOB O2 was 96% on 6L/min with task. needed increased time to slow breathing. Toilet Transfer: Minimal Dentist Details (indicate cue type and reason): patient declined to participate in transfers reporting he wanted to stay in the recliner until wife arrived. patient very fatigued reporting he did not sleep well previous night. patient declining to get back in bed. Toileting- Architect and Hygiene: Min guard               Vision   Vision Assessment?: No apparent visual deficits  Pertinent Vitals/Pain Pain Assessment Pain Assessment:  No/denies pain     Hand Dominance Right   Extremity/Trunk Assessment Upper Extremity Assessment Upper Extremity Assessment: Overall WFL for tasks assessed   Lower Extremity Assessment Lower Extremity Assessment: Defer to PT evaluation RLE Sensation: history of peripheral neuropathy LLE Sensation: history of peripheral neuropathy   Cervical / Trunk Assessment Cervical / Trunk Assessment: Normal   Communication Communication Communication: No difficulties   Cognition Arousal/Alertness: Lethargic Behavior During Therapy: WFL for tasks assessed/performed Overall Cognitive Status: Within Functional Limits for tasks assessed       General Comments: very plesant                Home Living Family/patient expects to be discharged to:: Private residence Living Arrangements: Spouse/significant other Available Help at Discharge: Family;Available 24 hours/day Type of Home: House Home Access: Level entry     Home Layout: One level     Bathroom Shower/Tub: Producer, television/film/video: Standard     Home Equipment: None          Prior Functioning/Environment Prior Level of Function : Independent/Modified Independent;Driving                        OT Problem List: Decreased activity tolerance;Impaired balance (sitting and/or standing);Decreased coordination;Decreased safety awareness;Decreased knowledge of precautions      OT Treatment/Interventions: Self-care/ADL training;Energy conservation;Therapeutic exercise;DME and/or AE instruction;Therapeutic activities;Patient/family education;Balance training    OT Goals(Current goals can be found in the care plan section) Acute Rehab OT Goals Patient Stated Goal: to get better OT Goal Formulation: With patient Time For Goal Achievement: 01/24/23 Potential to Achieve Goals: Fair  OT Frequency: Min 2X/week       AM-PAC OT "6 Clicks" Daily Activity     Outcome Measure Help from another person eating  meals?: None Help from another person taking care of personal grooming?: A Little Help from another person toileting, which includes using toliet, bedpan, or urinal?: A Lot Help from another person bathing (including washing, rinsing, drying)?: A Lot Help from another person to put on and taking off regular upper body clothing?: A Little Help from another person to put on and taking off regular lower body clothing?: A Lot 6 Click Score: 16   End of Session Equipment Utilized During Treatment: Oxygen  Activity Tolerance: Patient limited by fatigue;Patient limited by lethargy Patient left: in chair;with call bell/phone within reach;with chair alarm set  OT Visit Diagnosis: Unsteadiness on feet (R26.81);Other abnormalities of gait and mobility (R26.89)                Time: 1610-9604 OT Time Calculation (min): 17 min Charges:  OT General Charges $OT Visit: 1 Visit OT Evaluation $OT Eval Low Complexity: 1 Low  Wynell Halberg OTR/L, MS Acute Rehabilitation Department Office# [phone]   Selinda Flavin 01/10/2023, 4:04 PM

## 2023-01-10 NOTE — Progress Notes (Signed)
   NAME:  Jeffrey [name], MRN:  [id number], DOB:  1946/03/02, LOS: 2 ADMISSION DATE:  01/07/2023, CONSULTATION DATE:  01/10/2023  REFERRING MD:  Myrene Buddy, CHIEF COMPLAINT: Respiratory distress, on high flow oxygen  History of Present Illness:  77 year old obese never smoker was admitted via his PCP office on 5/9 where he presented with 12 days of cough.  Chest x-ray showed right lower lobe pneumonia.  Oxygen saturation was between 89 to 91%.  When he got home and saturations dropped further, he called EMS In the ED was noted to be wheezing despite continuous albuterol neb. He was placed on oxygen and by this morning is requiring 10 to 12 L high flow nasal cannula hence PCCM consulted CT angiogram chest showed scattered groundglass opacities bilateral, patchy atelectasis or infiltrate in the right lower lobe Of note, CT chest from 2014 shows minimal scarring right lower lobe attributed to previous empyema  Labs showed mild leukocytosis, normal BNP, COVID flu RSV testing was negative, urine strep antigen was positive Pertinent  Medical History  Empyema requiring VATS 07/2008 Cervical fusion    Significant Hospital Events: Including procedures, antibiotic start and stop dates in addition to other pertinent events   5/11 admit for CAP 5/12 pneumococcal urinary antigen positive 5/13 O2 down to 5L.   Interim History / Subjective:   Feeling better Requiring less oxygen Wants to get out of bed.   Objective   Blood pressure (!) 168/73, pulse 76, temperature (!) 97.4 F (36.3 C), temperature source Oral, resp. rate (!) 22, height 5\' 11"  (1.803 m), weight 122.4 kg, SpO2 95 %.        Intake/Output Summary (Last 24 hours) at 01/10/2023 0832 Last data filed at 01/09/2023 2302 Gross per 24 hour  Intake 579.87 ml  Output 900 ml  Net -320.13 ml    Filed Weights   01/07/23 1647 01/08/23 0156 01/09/23 0500  Weight: 125.2 kg 122.6 kg 122.4 kg    Examination: General: overweight elderly man  in NAD HENT: [place]/AT, PERRL, no JVD Lungs: No accessory muscle use, r basilar crackles.  Cardiovascular: RRR, no MRG, no edema Abdomen: Soft, NT, ND Extremities: No acute deformity or ROM limitation Neuro: Alert, oriented, non-focal  Labs show improved sodium, stable BUN/creatinine 29/1.14, normal electrolytes, lactate 1.2, decreased leukocytes. H&H stable   Resolved Hospital Problem list     Assessment & Plan:  Acute hypoxic respiratory failure Pneumococcal pneumonia CT angiogram does not show any evidence of pulmonary fibrosis.  Right lower lobe scarring from past related to history of empyema  -Continue oxygen by nasal cannula for goal sat > 92% (now down to 5L from 10 L yesterday) -Continue ceftriaxone -Steroids off - PCP notes mention pulmonary fibrosis and referral to pulmonary was planned-but no evidence of ILD on CT - Can obtain outpatient follow-up.  PCCM will sign off   Best Practice (right click and "Reselect all SmartList Selections" daily)   Per TRH Code Status:  full code Last date of multidisciplinary goals of care discussion [per TRH]   Joneen Roach, AGACNP-BC [facility]  See Amion for personal pager PCCM on call pager [phone] until 7pm. Please call Elink 7p-7a. [phone]  01/10/2023 8:39 AM

## 2023-01-10 NOTE — Evaluation (Signed)
Physical Therapy Evaluation Patient Details Name: Jeffrey [name] MRN: [id number] DOB: 05/21/1946 Today's Date: 01/10/2023  History of Present Illness  Jeffrey [name] is a 77 y.o. male with past medical history significant for neuropathy, remote empyema s/p VATS 2009, cervical fusion, obesity who presented to [facility] ED on 01/07/2023 via EMS from home with progressive shortness of breath.  He was initially seen by his PCP on same day of ED presentation and diagnosed with pneumonia with chest x-ray concerning for right lower lobe infiltrate i  Clinical Impression  Pt admitted with above diagnosis.  Pt currently with functional limitations due to the deficits listed below (see PT Problem List). Pt will benefit from acute skilled PT to increase their independence and safety with mobility to allow discharge.     The patient  is eager to ambulate. Patient resting  at 93% on 5-6 LPM. Patient ambulated x 50' on 6 L with SPO2 drop to 845. Encouraged Pursed lip breaths, Instructed in IS and found flutter  valve in his belongings and placed on table. SPO2 returned  to 90 % on 6 LPM.  Patient has audible  wheeze and 3-4/4 dyspnea when ambulating.     Recommendations for follow up therapy are one component of a multi-disciplinary discharge planning process, led by the attending physician.  Recommendations may be updated based on patient status, additional functional criteria and insurance authorization.  Follow Up Recommendations       Assistance Recommended at Discharge Intermittent Supervision/Assistance  Patient can return home with the following  A little help with walking and/or transfers;A little help with bathing/dressing/bathroom;Assistance with cooking/housework    Equipment Recommendations Rolling walker (2 wheels) (may progress and not require it)  Recommendations for Other Services       Functional Status Assessment Patient has had a recent decline in their functional status and  demonstrates the ability to make significant improvements in function in a reasonable and predictable amount of time.     Precautions / Restrictions Precautions Precaution Comments: monitor HR/SPO2      Mobility  Bed Mobility   Bed Mobility: Supine to Sit     Supine to sit: Min guard     General bed mobility comments: extra effort to push to sitting    Transfers Overall transfer level: Needs assistance Equipment used: Rolling walker (2 wheels) Transfers: Sit to/from Stand Sit to Stand: Mod assist           General transfer comment: unable to power up on first attempt , second attempt successful to power up, using RW.    Ambulation/Gait Ambulation/Gait assistance: Min assist Gait Distance (Feet): 50 Feet Assistive device: Rolling walker (2 wheels) Gait Pattern/deviations: Step-through pattern, Trunk flexed Gait velocity: decr     General Gait Details: cues to stay up and  into RW to use UE's for support.  Stairs            Wheelchair Mobility    Modified Rankin (Stroke Patients Only)       Balance Overall balance assessment: Mild deficits observed, not formally tested                                           Pertinent Vitals/Pain Pain Assessment Pain Assessment: No/denies pain    Home Living Family/patient expects to be discharged to:: Private residence Living Arrangements: Spouse/significant other Available Help at Discharge:  Family;Available 24 hours/day Type of Home: House Home Access: Level entry       Home Layout: One level Home Equipment: None      Prior Function Prior Level of Function : Independent/Modified Independent;Driving                     Hand Dominance   Dominant Hand: Right    Extremity/Trunk Assessment   Upper Extremity Assessment Upper Extremity Assessment: Overall WFL for tasks assessed    Lower Extremity Assessment Lower Extremity Assessment: RLE deficits/detail;LLE  deficits/detail RLE Sensation: history of peripheral neuropathy LLE Sensation: history of peripheral neuropathy    Cervical / Trunk Assessment Cervical / Trunk Assessment: Normal  Communication   Communication: No difficulties  Cognition Arousal/Alertness: Awake/alert Behavior During Therapy: WFL for tasks assessed/performed Overall Cognitive Status: Within Functional Limits for tasks assessed                                          General Comments      Exercises Other Exercises Other Exercises: IS demonstrated   Assessment/Plan    PT Assessment Patient needs continued PT services  PT Problem List Decreased strength;Decreased activity tolerance;Decreased mobility;Decreased safety awareness;Decreased knowledge of precautions;Cardiopulmonary status limiting activity       PT Treatment Interventions DME instruction;Therapeutic activities;Gait training;Functional mobility training;Therapeutic exercise;Patient/family education    PT Goals (Current goals can be found in the Care Plan section)  Acute Rehab PT Goals Patient Stated Goal: to walk more PT Goal Formulation: With patient Time For Goal Achievement: 01/24/23 Potential to Achieve Goals: Good    Frequency Min 1X/week     Co-evaluation               AM-PAC PT "6 Clicks" Mobility  Outcome Measure Help needed turning from your back to your side while in a flat bed without using bedrails?: None Help needed moving from lying on your back to sitting on the side of a flat bed without using bedrails?: None Help needed moving to and from a bed to a chair (including a wheelchair)?: A Little Help needed standing up from a chair using your arms (e.g., wheelchair or bedside chair)?: A Little Help needed to walk in hospital room?: A Little Help needed climbing 3-5 steps with a railing? : A Lot 6 Click Score: 19    End of Session Equipment Utilized During Treatment: Gait belt;Oxygen Activity  Tolerance: Patient limited by fatigue;Treatment limited secondary to medical complications (Comment) (SOB) Patient left: in chair;with chair alarm set;with call bell/phone within reach Nurse Communication: Mobility status PT Visit Diagnosis: Unsteadiness on feet (R26.81);Difficulty in walking, not elsewhere classified (R26.2)    Time: 8469-6295 PT Time Calculation (min) (ACUTE ONLY): 24 min   Charges:  PT eval, 1 gait           Blanchard Kelch PT Acute Rehabilitation Services Office [phone] Weekend pager-[phone] PT eval, 1 gait. Rada Hay 01/10/2023, 1:37 PM

## 2023-01-10 NOTE — Progress Notes (Signed)
PROGRESS NOTE    Jeffrey [name]  UJW:[id number] DOB: [birth year] DOA: 01/07/2023 PCP: [name]., MD    Brief Narrative:   Jeffrey [name] is a 77 y.o. male with past medical history significant for neuropathy, remote empyema s/p VATS 2009, cervical fusion, obesity who presented to [facility] ED on 01/07/2023 via EMS from home with progressive shortness of breath.  He was initially seen by his PCP on same day of ED presentation and diagnosed with pneumonia with chest x-ray concerning for right lower lobe infiltrate in which he was started on Levaquin.  He reports taking his first dose.  He then noted that his oxygen saturation was low and EMS was activated and patient was transported to the ED for further evaluation.  In the ED, temperature 99.0 F, HR 125, SpO2 82% on room air.  WBC 11.2, hemoglobin 11.9, platelets 212.  Sodium 134, potassium 3.6, chloride 101, CO2 22, glucose 167, BUN 18, creatinine 1.18.  AST 30, ALT 30, total bilirubin 0.8.  BNP 21.8.  Lactic acid 1.2.  Procalcitonin 1.95.  Patient was started on azithromycin and ceftriaxone.  EDP consulted [facility] for admission for further evaluation management of acute hypoxic respiratory failure secondary to pneumonia.  5/10: Admit, started on azithromycin/ceftriaxone; transfer to SDU for worsening respiratory status 5/11: PCCM consult, strep pneumo antigen +, azithromycin and steroids discontinued; remains on ceftriaxone 5/12: Oxygen requirements improving, down to 10 L HFNC 5/13: Oxygen requirement is down to 5 L [place], BP elevated, started hydralazine PRN; transfer to tele floor   Assessment & Plan:   Pneumococcal pneumonia; RLL Patient presenting to ED with 12-day history of progressive shortness of breath, cough.  Seen by PCP and started on Levaquin although had significant desaturation outpatient which led to ED presentation.  Patient was afebrile but with elevated WBC count of 11.2.  Noted SpO2 82% on room air on arrival.   Strep pneumo antigen positive.  COVID-19/influenza PCR negative.  BNP within normal limits.  CT angiogram chest negative for pulmonary embolism with infiltrates right lower lobe.  -- PCCM following; appreciate assistance -- Ceftriaxone 2 g IV every 24 hours -- Flonase/Claritin -- DuoNeb 3 times daily -- Albuterol neb every 2 hours as needed wheezing/SOB -- Incentive spirometry/flutter valve -- Continue supplemental oxygen, maintain SpO2 greater than 92%, down to 5L [place] this am -- Tx to Med/tele  Elevated blood pressure Not on antihypertensives outpatient. -- Hydralazine 25mg  PO q6h PRN SBP >165 -- If blood pressure remains elevated, may need to consider initiation of antihypertensive therapy  Neuropathy -- Gabapentin 300 mg p.o. twice daily, 600 mg p.o. nightly  Questional history of pulmonary fibrosis No evidence of ILD noted on CT angiogram chest. --Outpatient follow-up with pulmonology  Obesity Body mass index is 37.64 kg/m.  Discussed with patient needs for aggressive lifestyle changes/weight loss as this complicates all facets of care.  Outpatient follow-up with PCP.    Weakness/debility/gait disturbance/deconditioning: -- PT/OT evaluation: Pending   DVT prophylaxis: SCDs Start: 01/07/23 2356    Code Status: Full Code Family Communication: No family present at bedside this morning  Disposition Plan:  Level of care: Telemetry Status is: Inpatient Remains inpatient appropriate because: IV antibiotics, remains on oxygen    Consultants:  PCCM  Procedures:  None  Antimicrobials:  Azithromycin 5/10 - 5/10 Ceftriaxone 5/10>>   Subjective: Patient seen examined bedside, resting comfortably.  Lying in bed.  Shortness of breath continues to slowly improve.  O2 requirement now down to  5 L nasal cannula.  Reports felt weak and fatigued when mobilizing from the bed to the bedside chair yesterday, discussed will obtain PT/OT evaluation today.  No other specific questions or  concerns at this time.  Denies headache, no dizziness, no chest pain, no palpitations, no abdominal pain, no fever/chills/night sweats, no nausea/vomiting/diarrhea, no focal weakness, no fatigue, no cough, no paresthesias.  No acute events overnight per nursing staff.  Discussed with RN, stable for transfer to med/telemetry bed today.  Objective: Vitals:   01/10/23 0500 01/10/23 0600 01/10/23 0700 01/10/23 0753  BP: (!) 150/68 (!) 173/85 (!) 168/73   Pulse: 73 95 76   Resp: (!) 21 (!) 23 (!) 22   Temp:    (!) 97.4 F (36.3 C)  TempSrc:    Oral  SpO2: 94% 96% 95%   Weight:      Height:        Intake/Output Summary (Last 24 hours) at 01/10/2023 0905 Last data filed at 01/09/2023 2302 Gross per 24 hour  Intake 339.87 ml  Output 900 ml  Net -560.13 ml   Filed Weights   01/07/23 1647 01/08/23 0156 01/09/23 0500  Weight: 125.2 kg 122.6 kg 122.4 kg    Examination:  Physical Exam: GEN: NAD, alert and oriented x 3, obese HEENT: NCAT, PERRL, EOMI, sclera clear, MMM PULM: Crackles noted right base, no wheezing, normal respiratory effort without accessory muscle use, on 5 L [place] with SpO2 92% at rest CV: RRR w/o M/G/R GI: abd soft, NTND, NABS, no R/G/M MSK: no peripheral edema, moves all extremities independently NEURO: CN II-XII intact, no focal deficits, sensation to light touch intact PSYCH: normal mood/affect Integumentary: dry/intact, no rashes or wounds    Data Reviewed: I have personally reviewed following labs and imaging studies  CBC: Recent Labs  Lab 01/07/23 1700 01/08/23 0254 01/09/23 0257 01/10/23 0256  WBC 11.2* 14.1* 18.8* 9.7  NEUTROABS 9.5* 13.1* 16.9*  --   HGB 11.9* 11.1* 11.4* 11.3*  HCT 35.9* 34.6* 35.9* 35.7*  MCV 95.5 96.9 98.9 100.3*  PLT 212 208 217 247   Basic Metabolic Panel: Recent Labs  Lab 01/07/23 1700 01/08/23 0254 01/09/23 0257 01/10/23 0256  NA 134* 134* 136 138  K 3.6 4.1 4.5 4.3  CL 101 100 103 102  CO2 22 23 23 26   GLUCOSE  167* 177* 152* 114*  BUN 18 19 27* 29*  CREATININE 1.18 1.19 1.20 1.14  CALCIUM 8.5* 8.1* 8.3* 8.3*  MG  --  1.7  --   --    GFR: Estimated Creatinine Clearance: 75.7 mL/min (by C-G formula based on SCr of 1.14 mg/dL). Liver Function Tests: Recent Labs  Lab 01/07/23 1700 01/08/23 0254  AST 30 30  ALT 30 28  ALKPHOS 37* 31*  BILITOT 0.8 0.6  PROT 7.6 7.1  ALBUMIN 4.0 3.8   No results for input(s): "LIPASE", "AMYLASE" in the last 168 hours. No results for input(s): "AMMONIA" in the last 168 hours. Coagulation Profile: No results for input(s): "INR", "PROTIME" in the last 168 hours. Cardiac Enzymes: No results for input(s): "CKTOTAL", "CKMB", "CKMBINDEX", "TROPONINI" in the last 168 hours. BNP (last 3 results) No results for input(s): "PROBNP" in the last 8760 hours. HbA1C: No results for input(s): "HGBA1C" in the last 72 hours. CBG: No results for input(s): "GLUCAP" in the last 168 hours. Lipid Profile: No results for input(s): "CHOL", "HDL", "LDLCALC", "TRIG", "CHOLHDL", "LDLDIRECT" in the last 72 hours. Thyroid Function Tests: No results for input(s): "TSH", "  T4TOTAL", "FREET4", "T3FREE", "THYROIDAB" in the last 72 hours. Anemia Panel: No results for input(s): "VITAMINB12", "FOLATE", "FERRITIN", "TIBC", "IRON", "RETICCTPCT" in the last 72 hours. Sepsis Labs: Recent Labs  Lab 01/08/23 0254 01/08/23 0746 01/09/23 0257 01/10/23 0256  PROCALCITON 1.95 2.09 1.90 1.12  LATICACIDVEN  --  1.2  --   --     Recent Results (from the past 240 hour(s))  Resp panel by RT-PCR (RSV, Flu A&B, Covid) Anterior Nasal Swab     Status: None   Collection Time: 01/07/23  5:00 PM   Specimen: Anterior Nasal Swab  Result Value Ref Range Status   SARS Coronavirus 2 by RT PCR NEGATIVE NEGATIVE Final    Comment: (NOTE) SARS-CoV-2 target nucleic acids are NOT DETECTED.  The SARS-CoV-2 RNA is generally detectable in upper respiratory specimens during the acute phase of infection. The  lowest concentration of SARS-CoV-2 viral copies this assay can detect is 138 copies/mL. A negative result does not preclude SARS-Cov-2 infection and should not be used as the sole basis for treatment or other patient management decisions. A negative result may occur with  improper specimen collection/handling, submission of specimen other than nasopharyngeal swab, presence of viral mutation(s) within the areas targeted by this assay, and inadequate number of viral copies(<138 copies/mL). A negative result must be combined with clinical observations, patient history, and epidemiological information. The expected result is Negative.  Fact Sheet for Patients:  BloggerCourse.com  Fact Sheet for Healthcare Providers:  SeriousBroker.it  This test is no t yet approved or cleared by the [place] FDA and  has been authorized for detection and/or diagnosis of SARS-CoV-2 by FDA under an Emergency Use Authorization (EUA). This EUA will remain  in effect (meaning this test can be used) for the duration of the COVID-19 declaration under Section 564(b)(1) of the Act, 21 U.S.C.section 360bbb-3(b)(1), unless the authorization is terminated  or revoked sooner.       Influenza A by PCR NEGATIVE NEGATIVE Final   Influenza B by PCR NEGATIVE NEGATIVE Final    Comment: (NOTE) The Xpert Xpress SARS-CoV-2/FLU/RSV plus assay is intended as an aid in the diagnosis of influenza from Nasopharyngeal swab specimens and should not be used as a sole basis for treatment. Nasal washings and aspirates are unacceptable for Xpert Xpress SARS-CoV-2/FLU/RSV testing.  Fact Sheet for Patients: BloggerCourse.com  Fact Sheet for Healthcare Providers: SeriousBroker.it  This test is not yet approved or cleared by the [place] FDA and has been authorized for detection and/or diagnosis of SARS-CoV-2 by FDA under  an Emergency Use Authorization (EUA). This EUA will remain in effect (meaning this test can be used) for the duration of the COVID-19 declaration under Section 564(b)(1) of the Act, 21 U.S.C. section 360bbb-3(b)(1), unless the authorization is terminated or revoked.     Resp Syncytial Virus by PCR NEGATIVE NEGATIVE Final    Comment: (NOTE) Fact Sheet for Patients: BloggerCourse.com  Fact Sheet for Healthcare Providers: SeriousBroker.it  This test is not yet approved or cleared by the [place] FDA and has been authorized for detection and/or diagnosis of SARS-CoV-2 by FDA under an Emergency Use Authorization (EUA). This EUA will remain in effect (meaning this test can be used) for the duration of the COVID-19 declaration under Section 564(b)(1) of the Act, 21 U.S.C. section 360bbb-3(b)(1), unless the authorization is terminated or revoked.  Performed at [facility], [address], [place], [place] [address]   MRSA Next Gen by PCR, Nasal  Status: None   Collection Time: 01/08/23  2:08 AM   Specimen: Nasal Mucosa; Nasal Swab  Result Value Ref Range Status   MRSA by PCR Next Gen NOT DETECTED NOT DETECTED Final    Comment: (NOTE) The GeneXpert MRSA Assay (FDA approved for NASAL specimens only), is one component of a comprehensive MRSA colonization surveillance program. It is not intended to diagnose MRSA infection nor to guide or monitor treatment for MRSA infections. Test performance is not FDA approved in patients less than 70 years old. Performed at [facility], [address], [place], [place] [address]          Radiology Studies: No results found.      Scheduled Meds:  Chlorhexidine Gluconate Cloth  6 each Topical Daily   docusate sodium  100 mg Oral BID   enoxaparin (LOVENOX) injection  60 mg Subcutaneous Q24H   fluticasone  2 spray Each Nare Daily   gabapentin   300 mg Oral BID WC   gabapentin  600 mg Oral QHS   ipratropium-albuterol  3 mL Nebulization TID   loratadine  10 mg Oral Daily   melatonin  5 mg Oral QHS   Continuous Infusions:  cefTRIAXone (ROCEPHIN)  IV Stopped (01/09/23 2252)     LOS: 2 days    Time spent: 52 minutes spent on chart review, discussion with nursing staff, consultants, updating family and interview/physical exam; more than 50% of that time was spent in counseling and/or coordination of care.    Alvira Philips [place], DO [facility] Available via Epic secure chat 7am-7pm After these hours, please refer to coverage provider listed on [email or web address] 01/10/2023, 9:05 AM

## 2023-01-11 DIAGNOSIS — J13 Pneumonia due to Streptococcus pneumoniae: Secondary | ICD-10-CM | POA: Diagnosis not present

## 2023-01-11 LAB — BASIC METABOLIC PANEL
Anion gap: 7 (ref 5–15)
BUN: 26 mg/dL — ABNORMAL HIGH (ref 8–23)
CO2: 32 mmol/L (ref 22–32)
Calcium: 8.3 mg/dL — ABNORMAL LOW (ref 8.9–10.3)
Chloride: 101 mmol/L (ref 98–111)
Creatinine, Ser: 1.03 mg/dL (ref 0.61–1.24)
GFR, Estimated: >60 mL/min (ref >60)
Glucose, Bld: 104 mg/dL — ABNORMAL HIGH (ref 70–99)
Potassium: 4.3 mmol/L (ref 3.5–5.1)
Sodium: 140 mmol/L (ref 135–145)

## 2023-01-11 LAB — PROCALCITONIN: Procalcitonin: 0.46 ng/mL

## 2023-01-11 LAB — CBC
HCT: 36 % — ABNORMAL LOW (ref 39.0–52.0)
Hemoglobin: 11.3 g/dL — ABNORMAL LOW (ref 13.0–17.0)
MCH: 31.2 pg (ref 26.0–34.0)
MCHC: 31.4 g/dL (ref 30.0–36.0)
MCV: 99.4 fL (ref 80.0–100.0)
Platelets: 222 10*3/uL (ref 150–400)
RBC: 3.62 MIL/uL — ABNORMAL LOW (ref 4.22–5.81)
RDW: 14.5 % (ref 11.5–15.5)
WBC: 6.8 10*3/uL (ref 4.0–10.5)
nRBC: 0 % (ref 0.0–0.2)

## 2023-01-11 LAB — MAGNESIUM: Magnesium: 2.2 mg/dL (ref 1.7–2.4)

## 2023-01-11 MED ORDER — TRAZODONE HCL 50 MG PO TABS: 150.0000 mg | ORAL_TABLET | Freq: Every evening | ORAL | Status: DC | PRN

## 2023-01-11 MED ORDER — MELATONIN 5 MG PO TABS
10.0000 mg | ORAL_TABLET | Freq: Every day | ORAL | Status: DC
  Administered 2023-01-11 – 2023-01-13 (×3): 10 mg via ORAL
  Filled 2023-01-11 (×3): qty 2

## 2023-01-11 MED ORDER — AYR SALINE NASAL NA GEL
1.0000 | Freq: Three times a day (TID) | NASAL | Status: DC
  Administered 2023-01-11 – 2023-01-14 (×10): 1 via NASAL
  Filled 2023-01-11: qty 14.1

## 2023-01-11 NOTE — Plan of Care (Signed)
  Problem: Activity: Goal: Ability to tolerate increased activity will improve Outcome: Progressing   Problem: Clinical Measurements: Goal: Ability to maintain a body temperature in the normal range will improve Outcome: Progressing   

## 2023-01-11 NOTE — Progress Notes (Signed)
PROGRESS NOTE    Jeffrey [name]  [name]:[id number] DOB: 10/29/1945 DOA: 01/07/2023 PCP: [name]., MD    Brief Narrative:   Jeffrey [name] is a 77 y.o. male with past medical history significant for neuropathy, remote empyema s/p VATS 2009, cervical fusion, obesity who presented to [facility] ED on 01/07/2023 via EMS from home with progressive shortness of breath.  He was initially seen by his PCP on same day of ED presentation and diagnosed with pneumonia with chest x-ray concerning for right lower lobe infiltrate in which he was started on Levaquin.  He reports taking his first dose.  He then noted that his oxygen saturation was low and EMS was activated and patient was transported to the ED for further evaluation.  In the ED, temperature 99.0 F, HR 125, SpO2 82% on room air.  WBC 11.2, hemoglobin 11.9, platelets 212.  Sodium 134, potassium 3.6, chloride 101, CO2 22, glucose 167, BUN 18, creatinine 1.18.  AST 30, ALT 30, total bilirubin 0.8.  BNP 21.8.  Lactic acid 1.2.  Procalcitonin 1.95.  Patient was started on azithromycin and ceftriaxone.  EDP consulted [facility] for admission for further evaluation management of acute hypoxic respiratory failure secondary to pneumonia.  5/10: Admit, started on azithromycin/ceftriaxone; transfer to SDU for worsening respiratory status 5/11: PCCM consult, strep pneumo antigen +, azithromycin and steroids discontinued; remains on ceftriaxone 5/12: Oxygen requirements improving, down to 10 L HFNC 5/13: Oxygen requirement is down to 5 L [place], BP elevated, started hydralazine PRN; transfer to tele floor  5/14: Remains on 6 L nasal cannula, reports nosebleed with O2, starting saline nasal gel, on IV abx, slowly improving  Assessment & Plan:   Pneumococcal pneumonia; RLL Patient presenting to ED with 12-day history of progressive shortness of breath, cough.  Seen by PCP and started on Levaquin although had significant desaturation outpatient which led  to ED presentation.  Patient was afebrile but with elevated WBC count of 11.2.  Noted SpO2 82% on room air on arrival.  Strep pneumo antigen positive.  COVID-19/influenza PCR negative.  BNP within normal limits.  CT angiogram chest negative for pulmonary embolism with infiltrates right lower lobe.  PCCM was initially following and now signed off. -- WBC 11.2>>18.8>9.7>6.8 -- PCT  2.09>>0.46 -- Ceftriaxone 2 g IV every 24 hours -- Flonase/Claritin -- DuoNeb 3 times daily -- Albuterol neb every 2 hours as needed wheezing/SOB -- Incentive spirometry/flutter valve -- Continue supplemental oxygen, maintain SpO2 greater than 92%, down to 6L [place] this am with SpO2 96% at rest -- Attempt ambulatory O2 screen tomorrow  Elevated blood pressure Not on antihypertensives outpatient. -- Hydralazine 25mg  PO q6h PRN SBP >165 -- If blood pressure remains elevated, may need to consider initiation of antihypertensive therapy  Neuropathy -- Gabapentin 300 mg p.o. twice daily, 600 mg p.o. nightly  Questional history of pulmonary fibrosis No evidence of ILD noted on CT angiogram chest. --Outpatient follow-up with pulmonology  Insomnia -- Trazodone 150 mg p.o. nightly PRN -- Melatonin 10 mg p.o. nightly  Obesity Body mass index is 37.3 kg/m.  Discussed with patient needs for aggressive lifestyle changes/weight loss as this complicates all facets of care.  Outpatient follow-up with PCP.    Weakness/debility/gait disturbance/deconditioning: -- PT/OT evaluation: Pending   DVT prophylaxis: SCDs Start: 01/07/23 2356    Code Status: Full Code Family Communication: No family present at bedside this morning  Disposition Plan:  Level of care: Telemetry Status is: Inpatient Remains inpatient appropriate  because: IV antibiotics, remains on oxygen    Consultants:  PCCM  Procedures:  None  Antimicrobials:  Azithromycin 5/10 - 5/10 Ceftriaxone 5/10>>   Subjective: Patient seen examined bedside,  resting comfortably.  Lying in bed.  Reports dyspnea continues to slowly improve.  Reports intermittent nasal bleeding with oxygen, discussed will start saline nasal gel today.  Worked with physical therapy yesterday.  Continues to utilize flutter valve/incentive spirometry.  WBC count and procalcitonin continues to trend down.  Will attempt ambulatory O2 screening tomorrow.   No other specific questions or concerns at this time.  Denies headache, no dizziness, no chest pain, no palpitations, no abdominal pain, no fever/chills/night sweats, no nausea/vomiting/diarrhea, no focal weakness, no fatigue, no cough, no paresthesias.  No acute events overnight per nursing staff.    Objective: Vitals:   01/10/23 2135 01/11/23 0500 01/11/23 0513 01/11/23 0737  BP: (!) 162/69  (!) 106/54   Pulse: 77  65   Resp: (!) 23  17   Temp: 98.6 F (37 C)  98 F (36.7 C)   TempSrc: Oral  Oral   SpO2: 94%  96% 97%  Weight:  121.3 kg    Height:        Intake/Output Summary (Last 24 hours) at 01/11/2023 1126 Last data filed at 01/11/2023 0535 Gross per 24 hour  Intake 1037 ml  Output 1305 ml  Net -268 ml   Filed Weights   01/08/23 0156 01/09/23 0500 01/11/23 0500  Weight: 122.6 kg 122.4 kg 121.3 kg    Examination:  Physical Exam: GEN: NAD, alert and oriented x 3, obese HEENT: NCAT, PERRL, EOMI, sclera clear, MMM PULM: Crackles noted right base, no wheezing, normal respiratory effort without accessory muscle use, on 6 L [place] with SpO2 96% at rest CV: RRR w/o M/G/R GI: abd soft, NTND, NABS, no R/G/M MSK: no peripheral edema, moves all extremities independently NEURO: CN II-XII intact, no focal deficits, sensation to light touch intact PSYCH: normal mood/affect Integumentary: dry/intact, no rashes or wounds    Data Reviewed: I have personally reviewed following labs and imaging studies  CBC: Recent Labs  Lab 01/07/23 1700 01/08/23 0254 01/09/23 0257 01/10/23 0256 01/11/23 0421  WBC 11.2* 14.1*  18.8* 9.7 6.8  NEUTROABS 9.5* 13.1* 16.9*  --   --   HGB 11.9* 11.1* 11.4* 11.3* 11.3*  HCT 35.9* 34.6* 35.9* 35.7* 36.0*  MCV 95.5 96.9 98.9 100.3* 99.4  PLT 212 208 217 247 222   Basic Metabolic Panel: Recent Labs  Lab 01/07/23 1700 01/08/23 0254 01/09/23 0257 01/10/23 0256 01/11/23 0421  NA 134* 134* 136 138 140  K 3.6 4.1 4.5 4.3 4.3  CL 101 100 103 102 101  CO2 22 23 23 26  32  GLUCOSE 167* 177* 152* 114* 104*  BUN 18 19 27* 29* 26*  CREATININE 1.18 1.19 1.20 1.14 1.03  CALCIUM 8.5* 8.1* 8.3* 8.3* 8.3*  MG  --  1.7  --   --  2.2   GFR: Estimated Creatinine Clearance: 83.4 mL/min (by C-G formula based on SCr of 1.03 mg/dL). Liver Function Tests: Recent Labs  Lab 01/07/23 1700 01/08/23 0254  AST 30 30  ALT 30 28  ALKPHOS 37* 31*  BILITOT 0.8 0.6  PROT 7.6 7.1  ALBUMIN 4.0 3.8   No results for input(s): "LIPASE", "AMYLASE" in the last 168 hours. No results for input(s): "AMMONIA" in the last 168 hours. Coagulation Profile: No results for input(s): "INR", "PROTIME" in the last 168 hours. Cardiac  Enzymes: No results for input(s): "CKTOTAL", "CKMB", "CKMBINDEX", "TROPONINI" in the last 168 hours. BNP (last 3 results) No results for input(s): "PROBNP" in the last 8760 hours. HbA1C: No results for input(s): "HGBA1C" in the last 72 hours. CBG: No results for input(s): "GLUCAP" in the last 168 hours. Lipid Profile: No results for input(s): "CHOL", "HDL", "LDLCALC", "TRIG", "CHOLHDL", "LDLDIRECT" in the last 72 hours. Thyroid Function Tests: No results for input(s): "TSH", "T4TOTAL", "FREET4", "T3FREE", "THYROIDAB" in the last 72 hours. Anemia Panel: No results for input(s): "VITAMINB12", "FOLATE", "FERRITIN", "TIBC", "IRON", "RETICCTPCT" in the last 72 hours. Sepsis Labs: Recent Labs  Lab 01/08/23 0746 01/09/23 0257 01/10/23 0256 01/11/23 0421  PROCALCITON 2.09 1.90 1.12 0.46  LATICACIDVEN 1.2  --   --   --     Recent Results (from the past 240 hour(s))   Resp panel by RT-PCR (RSV, Flu A&B, Covid) Anterior Nasal Swab     Status: None   Collection Time: 01/07/23  5:00 PM   Specimen: Anterior Nasal Swab  Result Value Ref Range Status   SARS Coronavirus 2 by RT PCR NEGATIVE NEGATIVE Final    Comment: (NOTE) SARS-CoV-2 target nucleic acids are NOT DETECTED.  The SARS-CoV-2 RNA is generally detectable in upper respiratory specimens during the acute phase of infection. The lowest concentration of SARS-CoV-2 viral copies this assay can detect is 138 copies/mL. A negative result does not preclude SARS-Cov-2 infection and should not be used as the sole basis for treatment or other patient management decisions. A negative result may occur with  improper specimen collection/handling, submission of specimen other than nasopharyngeal swab, presence of viral mutation(s) within the areas targeted by this assay, and inadequate number of viral copies(<138 copies/mL). A negative result must be combined with clinical observations, patient history, and epidemiological information. The expected result is Negative.  Fact Sheet for Patients:  BloggerCourse.com  Fact Sheet for Healthcare Providers:  SeriousBroker.it  This test is no t yet approved or cleared by the [place] FDA and  has been authorized for detection and/or diagnosis of SARS-CoV-2 by FDA under an Emergency Use Authorization (EUA). This EUA will remain  in effect (meaning this test can be used) for the duration of the COVID-19 declaration under Section 564(b)(1) of the Act, 21 U.S.C.section 360bbb-3(b)(1), unless the authorization is terminated  or revoked sooner.       Influenza A by PCR NEGATIVE NEGATIVE Final   Influenza B by PCR NEGATIVE NEGATIVE Final    Comment: (NOTE) The Xpert Xpress SARS-CoV-2/FLU/RSV plus assay is intended as an aid in the diagnosis of influenza from Nasopharyngeal swab specimens and should not be used  as a sole basis for treatment. Nasal washings and aspirates are unacceptable for Xpert Xpress SARS-CoV-2/FLU/RSV testing.  Fact Sheet for Patients: BloggerCourse.com  Fact Sheet for Healthcare Providers: SeriousBroker.it  This test is not yet approved or cleared by the [place] FDA and has been authorized for detection and/or diagnosis of SARS-CoV-2 by FDA under an Emergency Use Authorization (EUA). This EUA will remain in effect (meaning this test can be used) for the duration of the COVID-19 declaration under Section 564(b)(1) of the Act, 21 U.S.C. section 360bbb-3(b)(1), unless the authorization is terminated or revoked.     Resp Syncytial Virus by PCR NEGATIVE NEGATIVE Final    Comment: (NOTE) Fact Sheet for Patients: BloggerCourse.com  Fact Sheet for Healthcare Providers: SeriousBroker.it  This test is not yet approved or cleared by the [place] and has been authorized for  detection and/or diagnosis of SARS-CoV-2 by FDA under an Emergency Use Authorization (EUA). This EUA will remain in effect (meaning this test can be used) for the duration of the COVID-19 declaration under Section 564(b)(1) of the Act, 21 U.S.C. section 360bbb-3(b)(1), unless the authorization is terminated or revoked.  Performed at [facility], [address] [place], [place] [address]   MRSA Next Gen by PCR, Nasal     Status: None   Collection Time: 01/08/23  2:08 AM   Specimen: Nasal Mucosa; Nasal Swab  Result Value Ref Range Status   MRSA by PCR Next Gen NOT DETECTED NOT DETECTED Final    Comment: (NOTE) The GeneXpert MRSA Assay (FDA approved for NASAL specimens only), is one component of a comprehensive MRSA colonization surveillance program. It is not intended to diagnose MRSA infection nor to guide or monitor treatment for MRSA infections. Test performance  is not FDA approved in patients less than 75 years old. Performed at [facility], [address], [place], [place] [address]          Radiology Studies: [facility] Chest Port 1 View  Result Date: 01/10/2023 CLINICAL DATA:  Acute respiratory failure. EXAM: PORTABLE CHEST 1 VIEW COMPARISON:  01/07/2023 FINDINGS: Heart size is normal. The lungs are clear. There is no pulmonary edema. No focal consolidations. There is minimal subsegmental atelectasis at the LATERAL RIGHT lung base. Remote cervical fusion. IMPRESSION: Minimal subsegmental atelectasis at the LATERAL RIGHT lung base. Electronically Signed   By: Norva Pavlov M.D.   On: 01/10/2023 10:21        Scheduled Meds:  arformoterol  15 mcg Nebulization BID   budesonide (PULMICORT) nebulizer solution  0.5 mg Nebulization BID   docusate sodium  100 mg Oral BID   enoxaparin (LOVENOX) injection  60 mg Subcutaneous Q24H   fluticasone  2 spray Each Nare Daily   gabapentin  300 mg Oral BID WC   gabapentin  600 mg Oral QHS   loratadine  10 mg Oral Daily   melatonin  10 mg Oral QHS   revefenacin  175 mcg Nebulization Daily   saline  1 Application Each Nare TID   Continuous Infusions:  cefTRIAXone (ROCEPHIN)  IV Stopped (01/10/23 2241)     LOS: 3 days    Time spent: 52 minutes spent on chart review, discussion with nursing staff, consultants, updating family and interview/physical exam; more than 50% of that time was spent in counseling and/or coordination of care.    Alvira Philips [place], DO [facility] Available via Epic secure chat 7am-7pm After these hours, please refer to coverage provider listed on [email or web address] 01/11/2023, 11:26 AM

## 2023-01-11 NOTE — Progress Notes (Signed)
OT Cancellation Note  Patient Details Name: Jeffrey [name] MRN: [id number] DOB: 26-Jul-1946   Cancelled Treatment:    Reason Eval/Treat Not Completed: Fatigue/lethargy limiting ability to participate Patient is in bed resting. OT to continue to follow  Rosalio Loud, MS Acute Rehabilitation Department Office# [phone]  01/11/2023, 1:11 PM

## 2023-01-12 DIAGNOSIS — J13 Pneumonia due to Streptococcus pneumoniae: Secondary | ICD-10-CM | POA: Diagnosis not present

## 2023-01-12 MED ORDER — SODIUM CHLORIDE 0.9 % IV SOLN
2.0000 g | INTRAVENOUS | Status: AC
  Administered 2023-01-12 – 2023-01-13 (×2): 2 g via INTRAVENOUS
  Filled 2023-01-12 (×2): qty 20

## 2023-01-12 MED ORDER — METHYLPREDNISOLONE SODIUM SUCC 40 MG IJ SOLR
40.0000 mg | Freq: Two times a day (BID) | INTRAMUSCULAR | Status: AC
  Administered 2023-01-12 – 2023-01-13 (×4): 40 mg via INTRAVENOUS
  Filled 2023-01-12 (×4): qty 1

## 2023-01-12 NOTE — Progress Notes (Signed)
Occupational Therapy Treatment Patient Details Name: TYHIEM MADEY MRN: [id number] DOB: [birth year] Today's Date: 01/12/2023   History of present illness Jeffrey [name] is a 77 y.o. male with past medical history significant for neuropathy, remote empyema s/p VATS 2009, cervical fusion, obesity who presented to [facility] ED on 01/07/2023 via EMS from home with progressive shortness of breath.  He was initially seen by his PCP on same day of ED presentation and diagnosed with pneumonia with chest x-ray concerning for right lower lobe infiltrate i   OT comments  Patient was educated on how to maneuver around with O2 cord during functional mobility and ADL tasks. Patient was able to verbalize understanding but needed cues to apply during activity. Patient was noted to maintain O2 saturation on 2L/min with patient 90% after mobility around unit on 2L/min with increased SOB. Patient needed increased time to slow down breathing with education on in through nose. Patient was educated on ECT and compensatory strategies for LB dressing with AE for next level of care. Patient verbalized understanding reporting he did not need these at home he had his own methods. Patient's discharge plan remains appropriate at this time. OT will continue to follow acutely.     Recommendations for follow up therapy are one component of a multi-disciplinary discharge planning process, led by the attending physician.  Recommendations may be updated based on patient status, additional functional criteria and insurance authorization.    Assistance Recommended at Discharge Frequent or constant Supervision/Assistance  Patient can return home with the following  A little help with walking and/or transfers;Assistance with cooking/housework;Direct supervision/assist for medications management;Assist for transportation;Help with stairs or ramp for entrance;Direct supervision/assist for financial management;A little help with  bathing/dressing/bathroom   Equipment Recommendations  None recommended by OT       Precautions / Restrictions Precautions Precautions: Fall Precaution Comments: monitor HR/SPO2 Restrictions Weight Bearing Restrictions: No       Mobility Bed Mobility Overal bed mobility: Needs Assistance Bed Mobility: Sit to Supine       Sit to supine: Supervision                  ADL either performed or assessed with clinical judgement   ADL Overall ADL's : Needs assistance/impaired                       Lower Body Dressing Details (indicate cue type and reason): patient was educated on using long handled sponge. patient reported he would look into getting one. patient was educated on using hot water with showering and o2. patient verbalized understanding. patient repoted he did not want a shower seate since his shower was too small and he did not want to use the tub shower. Toilet Transfer: Supervision/safety;Ambulation;Rolling walker (2 wheels) Toilet Transfer Details (indicate cue type and reason): patient was educated on using RW in bathroom until [facility] could assess at home. patient verbalized understanding. patient was able to walk whole unit with 2L/min with patietn 90% at end with increased SOB. patient was noted to have RPE 4/5 at end of task..           General ADL Comments: patient was educated on recommendations for d/c. patient had various questions about particiaption in ADLs at home. patient was educated on general safety with O2 cord and ADL tasks at home with patient verbalized understanding but unable to participate. patient reported that he did not want to bring walker into bathroom. patient was  educated on esuring he brings it in with him and bringing up discussion with [facility] therapy. patient was educated on ECT and slow progression to desired tasks. patient verbalzied understanding and reported he wanted to live another ten years to see his grandson graduate from  Med school. all patients questions were answered and patient was in bed at rest at end of session. nurse made aware that patietn was able to maintain o2 on 2L/min during session with functional activity. patients nurse verbalized understanding.      Cognition Arousal/Alertness: Awake/alert Behavior During Therapy: WFL for tasks assessed/performed Overall Cognitive Status: Within Functional Limits for tasks assessed       General Comments: very plesant motivated to return home                   Pertinent Vitals/ Pain       Pain Assessment Pain Assessment: No/denies pain         Frequency  Min 2X/week        Progress Toward Goals  OT Goals(current goals can now be found in the care plan section)  Progress towards OT goals: Progressing toward goals     Plan Discharge plan remains appropriate       AM-PAC OT "6 Clicks" Daily Activity     Outcome Measure   Help from another person eating meals?: None Help from another person taking care of personal grooming?: A Little Help from another person toileting, which includes using toliet, bedpan, or urinal?: A Little Help from another person bathing (including washing, rinsing, drying)?: A Little Help from another person to put on and taking off regular upper body clothing?: A Little Help from another person to put on and taking off regular lower body clothing?: A Little 6 Click Score: 19    End of Session Equipment Utilized During Treatment: Oxygen  OT Visit Diagnosis: Unsteadiness on feet (R26.81);Other abnormalities of gait and mobility (R26.89)   Activity Tolerance Patient tolerated treatment well   Patient Left in bed;with call bell/phone within reach;with bed alarm set   Nurse Communication Mobility status        Time: 1610-9604 OT Time Calculation (min): 54 min  Charges: OT General Charges $OT Visit: 1 Visit OT Treatments $Self Care/Home Management : 53-67 mins  Rosalio Loud, MS Acute  Rehabilitation Department Office# [phone]   Selinda Flavin 01/12/2023, 1:57 PM

## 2023-01-12 NOTE — Plan of Care (Signed)
  Problem: Education: Goal: Knowledge of disease or condition will improve Outcome: Progressing Goal: Knowledge of the prescribed therapeutic regimen will improve Outcome: Progressing   Problem: Activity: Goal: Ability to tolerate increased activity will improve Outcome: Progressing   Problem: Respiratory: Goal: Levels of oxygenation will improve Outcome: Not Progressing   Problem: Activity: Goal: Ability to tolerate increased activity will improve Outcome: Not Progressing

## 2023-01-12 NOTE — TOC Initial Note (Addendum)
Transition of Care [facility]) - Initial/Assessment Note    Patient Details  Name: Jeffrey [name] MRN: [id number] Date of Birth: 1946/05/31  Transition of Care [facility]) CM/SW Contact:    Howell Rucks, RN Phone Number: 01/12/2023, 2:28 PM  Clinical Narrative: Met with pt at bedside on 01/11/23 to introduce role of TOC/NCM and review for dc needs. PT recommendation for HH PT and RW, pt agreeable, no preference.  [employer] rep- Jermaine for RW, to be delivered to pt's room prior to dc. [facility] PT/OT rep-Amy. Will continue to follow.    - 2:28pm Team chat from nurse, may need home 02, awaiting home 02 order and 02 sats.                Expected Discharge Plan: Home w Home Health Services Barriers to Discharge: Continued Medical Work up   Patient Goals and CMS Choice Patient states their goals for this hospitalization and ongoing recovery are:: Home with [facility] PT/OT CMS Medicare.gov Compare Post Acute Care list provided to:: Patient Choice offered to / list presented to : Patient      Expected Discharge Plan and Services   Discharge Planning Services: CM Consult   Living arrangements for the past 2 months: Single Family Home                 DME Arranged: Walker rolling DME Agency: [employer] Date DME Agency Contacted: 01/11/23 Time DME Agency Contacted: 1231 Representative spoke with at DME Agency: Vaughan Basta HH Arranged: PT, OT HH Agency: [facility] Date [facility] Agency Contacted: 01/11/23 Time HH Agency Contacted: 1303 Representative spoke with at [facility] Agency: Amy  Prior Living Arrangements/Services Living arrangements for the past 2 months: Single Family Home Lives with:: Spouse Patient language and need for interpreter reviewed:: Yes Do you feel safe going back to the place where you live?: Yes      Need for Family Participation in Patient Care: Yes (Comment) Care giver support system in place?: Yes (comment)      Activities of Daily Living Home Assistive  Devices/Equipment: Cane (specify quad or straight) ADL Screening (condition at time of admission) Patient's cognitive ability adequate to safely complete daily activities?: Yes Is the patient deaf or have difficulty hearing?: No Does the patient have difficulty seeing, even when wearing glasses/contacts?: No Does the patient have difficulty concentrating, remembering, or making decisions?: No Patient able to express need for assistance with ADLs?: Yes Does the patient have difficulty dressing or bathing?: No Independently performs ADLs?: Yes (appropriate for developmental age) Does the patient have difficulty walking or climbing stairs?: No Weakness of Legs: None Weakness of Arms/Hands: None  Permission Sought/Granted Permission sought to share information with : Case Manager Permission granted to share information with : Yes, Verbal Permission Granted  Share Information with NAME: Fannie Knee, RN           Emotional Assessment Appearance:: Appears stated age Attitude/Demeanor/Rapport: Gracious Affect (typically observed): Accepting Orientation: : Oriented to Self, Oriented to Place, Oriented to  Time, Oriented to Situation Alcohol / Substance Use: Not Applicable Psych Involvement: No (comment)  Admission diagnosis:  CAP (community acquired pneumonia) [J18.9] Community acquired pneumonia of right lower lobe of lung [J18.9] Patient Active Problem List   Diagnosis Date Noted   Pneumococcal pneumonia (HCC) 01/08/2023   CAP (community acquired pneumonia) 01/07/2023   Acute hypoxemic respiratory failure (HCC) 01/07/2023   Class 2 obesity due to excess calories with body mass index (BMI) of 37.0 to  37.9 in adult 01/07/2023   Neuropathy 01/07/2023   Polyneuropathy 12/21/2021   History of fusion of lumbar spine 12/21/2021   Gait disturbance 12/21/2021   Spondylolisthesis of lumbar region 12/07/2019   Pulmonary nodule 09/27/2011   Chronic cough 08/10/2011   PCP:  [name]., MD Pharmacy:   [id number] [place], [place] - [phone] [place] [address] Phone: [phone] Fax: [phone]     Social Determinants of Health (SDOH) Social History: SDOH Screenings   Food Insecurity: No Food Insecurity (01/10/2023)  Housing: Low Risk  (01/10/2023)  Transportation Needs: No Transportation Needs (01/10/2023)  Utilities: Not At Risk (01/10/2023)  Tobacco Use: Low Risk  (01/10/2023)   SDOH Interventions:     Readmission Risk Interventions    01/12/2023    2:27 PM 01/11/2023   12:29 PM  Readmission Risk Prevention Plan  Post Dischage Appt  Complete  Medication Screening  Complete  Transportation Screening Complete Complete  PCP or Specialist Appt within 5-7 Days Complete   Home Care Screening Complete

## 2023-01-12 NOTE — Care Management Important Message (Signed)
Important Message  Patient Details IM Letter given Name: Jeffrey [name] MRN: [id number] Date of Birth: 09/05/1945   Medicare Important Message Given:  Yes     Caren Macadam 01/12/2023, 1:09 PM

## 2023-01-12 NOTE — Plan of Care (Signed)
  Problem: Education: Goal: Knowledge of disease or condition will improve Outcome: Progressing Goal: Knowledge of the prescribed therapeutic regimen will improve Outcome: Progressing   Problem: Activity: Goal: Ability to tolerate increased activity will improve Outcome: Progressing   Problem: Respiratory: Goal: Ability to maintain a clear airway will improve Outcome: Progressing Goal: Levels of oxygenation will improve Outcome: Progressing   Problem: Health Behavior/Discharge Planning: Goal: Ability to manage health-related needs will improve Outcome: Progressing

## 2023-01-12 NOTE — Progress Notes (Addendum)
PROGRESS NOTE    Jeffrey [name]  YNW:[id number] DOB: 06-01-1946 DOA: 01/07/2023 PCP: [name]., MD    Brief Narrative:   Jeffrey [name] is a 77 y.o. male with past medical history significant for neuropathy, remote empyema s/p VATS 2009, cervical fusion, obesity who presented to [facility] ED on 01/07/2023 via EMS from home with progressive shortness of breath.  He was initially seen by his PCP on same day of ED presentation and diagnosed with pneumonia with chest x-ray concerning for right lower lobe infiltrate in which he was started on Levaquin.  He reports taking his first dose.  He then noted that his oxygen saturation was low and EMS was activated and patient was transported to the ED for further evaluation.  In the ED, temperature 99.0 F, HR 125, SpO2 82% on room air.  WBC 11.2, hemoglobin 11.9, platelets 212.  Sodium 134, potassium 3.6, chloride 101, CO2 22, glucose 167, BUN 18, creatinine 1.18.  AST 30, ALT 30, total bilirubin 0.8.  BNP 21.8.  Lactic acid 1.2.  Procalcitonin 1.95.  Patient was started on azithromycin and ceftriaxone.  EDP consulted TR[facility] for admission for further evaluation management of acute hypoxic respiratory failure secondary to pneumonia.  5/10: Admit, started on azithromycin/ceftriaxone; transfer to SDU for worsening respiratory status 5/11: PCCM consult, strep pneumo antigen +, azithromycin and steroids discontinued; remains on ceftriaxone 5/12: Oxygen requirements improving, down to 10 L HFNC 5/13: Oxygen requirement is down to 5 L [place], BP elevated, started hydralazine PRN; transfer to tele floor  5/14: Remains on 6 L nasal cannula, reports nosebleed with O2, starting saline nasal gel, on IV abx, slowly improving 5/15: On 6 L [place], remains on IV antibiotics day #6/7; restarting steroids Solu-Medrol 40 IV every 12, ambulatory O2 screen  Assessment & Plan:   Pneumococcal pneumonia; RLL Patient presenting to ED with 12-day history of progressive  shortness of breath, cough.  Seen by PCP and started on Levaquin although had significant desaturation outpatient which led to ED presentation.  Patient was afebrile but with elevated WBC count of 11.2.  Noted SpO2 82% on room air on arrival.  Strep pneumo antigen positive.  COVID-19/influenza PCR negative.  BNP within normal limits.  CT angiogram chest negative for pulmonary embolism with infiltrates right lower lobe.  PCCM was initially following and now signed off. -- WBC 11.2>>18.8>9.7>6.8 -- PCT  2.09>>0.46 -- Ceftriaxone 2 g IV every 24 hours day #6/7 --Solu-Medrol 40 mg IV every 12 hours -- Flonase/Claritin -- DuoNeb 3 times daily -- Albuterol neb every 2 hours as needed wheezing/SOB -- Incentive spirometry/flutter valve -- Continue supplemental oxygen, maintain SpO2 > 88%, down to 5L [place] this am with SpO2 95% at rest -- Ambulatory O2 screen today -- Repeat chest x-ray in the a.m.  Elevated blood pressure Not on antihypertensives outpatient. -- Hydralazine 25mg  PO q6h PRN SBP >165 -- If blood pressure remains elevated, may need to consider initiation of antihypertensive therapy  Neuropathy -- Gabapentin 300 mg p.o. twice daily, 600 mg p.o. nightly  Questional history of pulmonary fibrosis No evidence of ILD noted on CT angiogram chest. -- Outpatient follow-up with pulmonology  Insomnia -- Trazodone 150 mg p.o. nightly PRN -- Melatonin 10 mg p.o. nightly  Obesity Body mass index is 38.25 kg/m.  Discussed with patient needs for aggressive lifestyle changes/weight loss as this complicates all facets of care.  Outpatient follow-up with PCP.    Weakness/debility/gait disturbance/deconditioning: -- PT/OT evaluation: Recommending home health  PT/OT, rolling walker -- Continue therapy efforts while inpatient   DVT prophylaxis: SCDs Start: 01/07/23 2356    Code Status: Full Code Family Communication: No family present at bedside this morning  Disposition Plan:  Level of care:  Telemetry Status is: Inpatient Remains inpatient appropriate because: IV antibiotics, remains on oxygen    Consultants:  PCCM  Procedures:  None  Antimicrobials:  Azithromycin 5/10 - 5/10 Ceftriaxone 5/10>>   Subjective: Patient seen examined bedside, resting comfortably.  Sitting in bedside chair.  Just received breathing treatment.  Reports dyspnea continues to slowly improve.  Stated he was able to walk down the length of the hallway yesterday, did have increased shortness of breath but was able to recover within 30 seconds.  Discussed with patient we will continue to challenge his breathing today, ambulatory O2 screen ordered.  Also discussed will start steroids to assist with decreasing the inflammation within his lungs. Continues to encourage utilization of flutter valve/incentive spirometry. No other specific questions or concerns at this time.  Denies headache, no dizziness, no chest pain, no palpitations, no abdominal pain, no fever/chills/night sweats, no nausea/vomiting/diarrhea, no focal weakness, no fatigue, no cough, no paresthesias.  No acute events overnight per nursing staff.    Objective: Vitals:   01/11/23 2135 01/12/23 0447 01/12/23 0500 01/12/23 0821  BP: 119/61 (!) 144/102    Pulse: 80 71    Resp: (!) 21 (!) 24    Temp: 98.2 F (36.8 C) (!) 97.3 F (36.3 C)    TempSrc: Oral Oral    SpO2: 94% 95%  96%  Weight:   124.4 kg   Height:        Intake/Output Summary (Last 24 hours) at 01/12/2023 1013 Last data filed at 01/12/2023 0755 Gross per 24 hour  Intake 580 ml  Output 1725 ml  Net -1145 ml   Filed Weights   01/09/23 0500 01/11/23 0500 01/12/23 0500  Weight: 122.4 kg 121.3 kg 124.4 kg    Examination:  Physical Exam: GEN: NAD, alert and oriented x 3, obese HEENT: NCAT, PERRL, EOMI, sclera clear, MMM PULM: Crackles noted right base, soft late expiratory wheezing upper lung fields, normal respiratory effort without accessory muscle use, on 5 L [place] with  SpO2 96% at rest CV: RRR w/o M/G/R GI: abd soft, NTND, NABS, no R/G/M MSK: no peripheral edema, moves all extremities independently NEURO: CN II-XII intact, no focal deficits, sensation to light touch intact PSYCH: normal mood/affect Integumentary: dry/intact, no rashes or wounds    Data Reviewed: I have personally reviewed following labs and imaging studies  CBC: Recent Labs  Lab 01/07/23 1700 01/08/23 0254 01/09/23 0257 01/10/23 0256 01/11/23 0421  WBC 11.2* 14.1* 18.8* 9.7 6.8  NEUTROABS 9.5* 13.1* 16.9*  --   --   HGB 11.9* 11.1* 11.4* 11.3* 11.3*  HCT 35.9* 34.6* 35.9* 35.7* 36.0*  MCV 95.5 96.9 98.9 100.3* 99.4  PLT 212 208 217 247 222   Basic Metabolic Panel: Recent Labs  Lab 01/07/23 1700 01/08/23 0254 01/09/23 0257 01/10/23 0256 01/11/23 0421  NA 134* 134* 136 138 140  K 3.6 4.1 4.5 4.3 4.3  CL 101 100 103 102 101  CO2 22 23 23 26  32  GLUCOSE 167* 177* 152* 114* 104*  BUN 18 19 27* 29* 26*  CREATININE 1.18 1.19 1.20 1.14 1.03  CALCIUM 8.5* 8.1* 8.3* 8.3* 8.3*  MG  --  1.7  --   --  2.2   GFR: Estimated Creatinine Clearance: 84.5 mL/min (  by C-G formula based on SCr of 1.03 mg/dL). Liver Function Tests: Recent Labs  Lab 01/07/23 1700 01/08/23 0254  AST 30 30  ALT 30 28  ALKPHOS 37* 31*  BILITOT 0.8 0.6  PROT 7.6 7.1  ALBUMIN 4.0 3.8   No results for input(s): "LIPASE", "AMYLASE" in the last 168 hours. No results for input(s): "AMMONIA" in the last 168 hours. Coagulation Profile: No results for input(s): "INR", "PROTIME" in the last 168 hours. Cardiac Enzymes: No results for input(s): "CKTOTAL", "CKMB", "CKMBINDEX", "TROPONINI" in the last 168 hours. BNP (last 3 results) No results for input(s): "PROBNP" in the last 8760 hours. HbA1C: No results for input(s): "HGBA1C" in the last 72 hours. CBG: No results for input(s): "GLUCAP" in the last 168 hours. Lipid Profile: No results for input(s): "CHOL", "HDL", "LDLCALC", "TRIG", "CHOLHDL",  "LDLDIRECT" in the last 72 hours. Thyroid Function Tests: No results for input(s): "TSH", "T4TOTAL", "FREET4", "T3FREE", "THYROIDAB" in the last 72 hours. Anemia Panel: No results for input(s): "VITAMINB12", "FOLATE", "FERRITIN", "TIBC", "IRON", "RETICCTPCT" in the last 72 hours. Sepsis Labs: Recent Labs  Lab 01/08/23 0746 01/09/23 0257 01/10/23 0256 01/11/23 0421  PROCALCITON 2.09 1.90 1.12 0.46  LATICACIDVEN 1.2  --   --   --     Recent Results (from the past 240 hour(s))  Resp panel by RT-PCR (RSV, Flu A&B, Covid) Anterior Nasal Swab     Status: None   Collection Time: 01/07/23  5:00 PM   Specimen: Anterior Nasal Swab  Result Value Ref Range Status   SARS Coronavirus 2 by RT PCR NEGATIVE NEGATIVE Final    Comment: (NOTE) SARS-CoV-2 target nucleic acids are NOT DETECTED.  The SARS-CoV-2 RNA is generally detectable in upper respiratory specimens during the acute phase of infection. The lowest concentration of SARS-CoV-2 viral copies this assay can detect is 138 copies/mL. A negative result does not preclude SARS-Cov-2 infection and should not be used as the sole basis for treatment or other patient management decisions. A negative result may occur with  improper specimen collection/handling, submission of specimen other than nasopharyngeal swab, presence of viral mutation(s) within the areas targeted by this assay, and inadequate number of viral copies(<138 copies/mL). A negative result must be combined with clinical observations, patient history, and epidemiological information. The expected result is Negative.  Fact Sheet for Patients:  BloggerCourse.com  Fact Sheet for Healthcare Providers:  SeriousBroker.it  This test is no t yet approved or cleared by the [place] FDA and  has been authorized for detection and/or diagnosis of SARS-CoV-2 by FDA under an Emergency Use Authorization (EUA). This EUA will remain  in  effect (meaning this test can be used) for the duration of the COVID-19 declaration under Section 564(b)(1) of the Act, 21 U.S.C.section 360bbb-3(b)(1), unless the authorization is terminated  or revoked sooner.       Influenza A by PCR NEGATIVE NEGATIVE Final   Influenza B by PCR NEGATIVE NEGATIVE Final    Comment: (NOTE) The Xpert Xpress SARS-CoV-2/FLU/RSV plus assay is intended as an aid in the diagnosis of influenza from Nasopharyngeal swab specimens and should not be used as a sole basis for treatment. Nasal washings and aspirates are unacceptable for Xpert Xpress SARS-CoV-2/FLU/RSV testing.  Fact Sheet for Patients: BloggerCourse.com  Fact Sheet for Healthcare Providers: SeriousBroker.it  This test is not yet approved or cleared by the [place] FDA and has been authorized for detection and/or diagnosis of SARS-CoV-2 by FDA under an Emergency Use Authorization (EUA). This EUA will  remain in effect (meaning this test can be used) for the duration of the COVID-19 declaration under Section 564(b)(1) of the Act, 21 U.S.C. section 360bbb-3(b)(1), unless the authorization is terminated or revoked.     Resp Syncytial Virus by PCR NEGATIVE NEGATIVE Final    Comment: (NOTE) Fact Sheet for Patients: BloggerCourse.com  Fact Sheet for Healthcare Providers: SeriousBroker.it  This test is not yet approved or cleared by the [place] FDA and has been authorized for detection and/or diagnosis of SARS-CoV-2 by FDA under an Emergency Use Authorization (EUA). This EUA will remain in effect (meaning this test can be used) for the duration of the COVID-19 declaration under Section 564(b)(1) of the Act, 21 U.S.C. section 360bbb-3(b)(1), unless the authorization is terminated or revoked.  Performed at [facility], [address], [place], [place] [address]    MRSA Next Gen by PCR, Nasal     Status: None   Collection Time: 01/08/23  2:08 AM   Specimen: Nasal Mucosa; Nasal Swab  Result Value Ref Range Status   MRSA by PCR Next Gen NOT DETECTED NOT DETECTED Final    Comment: (NOTE) The GeneXpert MRSA Assay (FDA approved for NASAL specimens only), is one component of a comprehensive MRSA colonization surveillance program. It is not intended to diagnose MRSA infection nor to guide or monitor treatment for MRSA infections. Test performance is not FDA approved in patients less than 33 years old. Performed at [facility], [address], [place], [place] [address]          Radiology Studies: No results found.      Scheduled Meds:  arformoterol  15 mcg Nebulization BID   budesonide (PULMICORT) nebulizer solution  0.5 mg Nebulization BID   docusate sodium  100 mg Oral BID   enoxaparin (LOVENOX) injection  60 mg Subcutaneous Q24H   fluticasone  2 spray Each Nare Daily   gabapentin  300 mg Oral BID WC   gabapentin  600 mg Oral QHS   loratadine  10 mg Oral Daily   melatonin  10 mg Oral QHS   methylPREDNISolone (SOLU-MEDROL) injection  40 mg Intravenous Q12H   revefenacin  175 mcg Nebulization Daily   saline  1 Application Each Nare TID   Continuous Infusions:  cefTRIAXone (ROCEPHIN)  IV       LOS: 4 days    Time spent: 52 minutes spent on chart review, discussion with nursing staff, consultants, updating family and interview/physical exam; more than 50% of that time was spent in counseling and/or coordination of care.    Alvira Philips [place], DO [facility] Available via Epic secure chat 7am-7pm After these hours, please refer to coverage provider listed on [email or web address] 01/12/2023, 10:13 AM

## 2023-01-13 ENCOUNTER — Inpatient Hospital Stay (HOSPITAL_COMMUNITY): Payer: Medicare Other

## 2023-01-13 DIAGNOSIS — J13 Pneumonia due to Streptococcus pneumoniae: Secondary | ICD-10-CM | POA: Diagnosis not present

## 2023-01-13 MED ORDER — PREDNISONE 20 MG PO TABS
40.0000 mg | ORAL_TABLET | Freq: Every day | ORAL | Status: DC
  Administered 2023-01-14: 40 mg via ORAL
  Filled 2023-01-13: qty 2

## 2023-01-13 NOTE — Progress Notes (Signed)
Occupational Therapy Treatment Patient Details Name: Jeffrey [name] MRN: [id number] DOB: 1946-03-16 Today's Date: 01/13/2023   History of present illness Jeffrey [name] is a 77 y.o. male with past medical history significant for neuropathy, remote emphysema s/p VATS 2009, cervical fusion, obesity who presented to [facility] ED on 01/07/2023 via EMS from home with progressive shortness of breath.  He was initially seen by his PCP on same day of ED presentation and diagnosed with pneumonia with chest x-ray concerning for right lower lobe infiltrate.   OT comments  OT provided education to the pt on implementing energy conservation strategies as needed during self-care and other activities. OT further recommended the pt consider use of a shower seat for improved activity tolerance during bathing tasks. Pt asked relevant questions as warranted, with an emphasis placed on his occupational performance and participation during his anticipated return home. An educational handout was reviewed and provided to the pt. He verbalized understanding on education provided.    Recommendations for follow up therapy are one component of a multi-disciplinary discharge planning process, led by the attending physician.  Recommendations may be updated based on patient status, additional functional criteria and insurance authorization.    Assistance Recommended at Discharge  PRN  Patient can return home with the following  A little help with walking and/or transfers;Assistance with cooking/housework;Assist for transportation;Help with stairs or ramp for entrance;A little help with bathing/dressing/bathroom   Equipment Recommendations  None recommended by OT       Precautions / Restrictions Precautions Precaution Comments: monitor HR/SPO2 Restrictions Weight Bearing Restrictions: No       Mobility Bed Mobility               General bed mobility comments: Pt was received seated in the bedside  chair           ADL either performed or assessed with clinical judgement   ADL Overall ADL's : Needs assistance/impaired Eating/Feeding: Independent;Sitting   Grooming: Modified independent;Sitting          General ADL Comments: OT provided education to the pt on implementing energy conservation strategies as needed during self-care and other activities. OT further recommended the pt consider use of a shower seat for improved activity tolerance during bathing tasks. Pt asked relevant questions as warranted, with an emphasis placed on his occupational performance  and participation during his anticipated return home. An educational handout was reviewed and provided to the pt. He verbalized understanding on education provided.               Cognition Arousal/Alertness: Awake/alert Behavior During Therapy: WFL for tasks assessed/performed Overall Cognitive Status: Within Functional Limits for tasks assessed        General Comments: friendly, motivated, cooperative                   Pertinent Vitals/ Pain       Pain Assessment Pain Assessment: No/denies pain         Frequency  Min 1X/week        Progress Toward Goals  OT Goals(current goals can now be found in the care plan section)  Progress towards OT goals: Progressing toward goals  Acute Rehab OT Goals Patient Stated Goal: to get better OT Goal Formulation: With patient Time For Goal Achievement: 01/24/23 Potential to Achieve Goals: Good  Plan Discharge plan remains appropriate       AM-PAC OT "6 Clicks" Daily Activity     Outcome Measure  Help from another person eating meals?: None Help from another person taking care of personal grooming?: A Little Help from another person toileting, which includes using toliet, bedpan, or urinal?: A Little Help from another person bathing (including washing, rinsing, drying)?: A Little Help from another person to put on and taking off regular upper body  clothing?: None Help from another person to put on and taking off regular lower body clothing?: A Little 6 Click Score: 20    End of Session Equipment Utilized During Treatment: Oxygen      Activity Tolerance Patient tolerated treatment well   Patient Left in chair;with call bell/phone within reach   Nurse Communication Mobility status        Time: 1203-1229 OT Time Calculation (min): 26 min  Charges: OT General Charges $OT Visit: 1 Visit OT Treatments $Self Care/Home Management : 23-37 mins     Reuben Likes, OTR/L 01/13/2023, 12:39 PM

## 2023-01-13 NOTE — Progress Notes (Signed)
Physical Therapy Treatment Patient Details Name: Jeffrey [name] MRN: [id number] DOB: [birth year] Today's Date: 01/13/2023   History of Present Illness Jeffrey [name] is a 77 y.o. male with past medical history significant for neuropathy, remote emphysema s/p VATS 2009, cervical fusion, obesity who presented to [facility] ED on 01/07/2023 via EMS from home with progressive shortness of breath.  He was initially seen by his PCP on same day of ED presentation and diagnosed with pneumonia with chest x-ray concerning for right lower lobe infiltrate.    PT Comments    Pt AxO x 3 very pleasant.  OOB in recliner on 2 lts nasal at 93% Assisted with amb in hallway twice.  General Gait Details: FIRST amb without any AD as pt did prior VERY unsteady gait.  Then pt admitted to using the "furniture/walls" in the house to steady self.  RA decreased to 84%.  SECOND amb with RW + oxygen much improved stability.  Amb 50 feet 2 one seated rest break.  Rec/Instructed pt to use walker "until you get stronger" pt agreed.  Tolerated distance with 2/4 dyspnea.  Required 3 lts with activity to achieve sats >90%.  SATURATION QUALIFICATIONS: (This note is used to comply with regulatory documentation for home oxygen)   Patient Saturations on Room Air at Rest =87% HR 58   Patient Saturations on Room Air while Ambulating 30 feet= 84% HR 87   Patient Saturation on 2 lts at rest 90% Patient Saturations on 3 Liters of oxygen while Ambulating 50 feet= 90%   Please briefly explain why patient needs home oxygen:  pt requires supplemental oxygen to achieve therapeutic sats.  Pt requires 2 lts at rest and 3 lts with activity.  Educated pt on how to monitor using pulse ox (has one at home).  Pt plans to D/C to home with oxygen.  Has a walker and cane already.     Recommendations for follow up therapy are one component of a multi-disciplinary discharge planning process, led by the attending physician.  Recommendations may  be updated based on patient status, additional functional criteria and insurance authorization.  Follow Up Recommendations       Assistance Recommended at Discharge Intermittent Supervision/Assistance  Patient can return home with the following A little help with walking and/or transfers;A little help with bathing/dressing/bathroom;Assistance with cooking/housework   Equipment Recommendations  Rolling walker (2 wheels);Other (comment) (oxygen)    Recommendations for Other Services       Precautions / Restrictions Precautions Precautions: Fall Precaution Comments: monitor sats Restrictions Weight Bearing Restrictions: No     Mobility  Bed Mobility               General bed mobility comments: OOB in recliner    Transfers Overall transfer level: Needs assistance Equipment used: Rolling walker (2 wheels) Transfers: Sit to/from Stand Sit to Stand: Supervision, Min guard           General transfer comment: able to self rise B UE out of recliner    Ambulation/Gait Ambulation/Gait assistance: Supervision, Min guard Gait Distance (Feet): 100 Feet (50 feet x 2) Assistive device: Rolling walker (2 wheels), None Gait Pattern/deviations: Step-to pattern, Step-through pattern, Drifts right/left Gait velocity: decr     General Gait Details: FIRST amb without any AD as pt did prior VERY unsteady gait.  Then pt admitted to using the "furniture/walls" in the house to steady self.  SECOND amb with RW much improved stability.  Amb 50 feet 2 one  seated rest break.  Rec/Instructed pt to use walker "until you get stronger" pt agreed.  Tolerated distance with 2/4 dyspnea.  Required 3 lts with activity to achieve sats >90%.   Stairs             Wheelchair Mobility    Modified Rankin (Stroke Patients Only)       Balance                                            Cognition Arousal/Alertness: Awake/alert Behavior During Therapy: WFL for tasks  assessed/performed Overall Cognitive Status: Within Functional Limits for tasks assessed                                 General Comments: AxO x 3 very motivated        Exercises      General Comments        Pertinent Vitals/Pain Pain Assessment Pain Assessment: No/denies pain    Home Living                          Prior Function            PT Goals (current goals can now be found in the care plan section) Progress towards PT goals: Progressing toward goals    Frequency    Min 1X/week      PT Plan Current plan remains appropriate    Co-evaluation              AM-PAC PT "6 Clicks" Mobility   Outcome Measure  Help needed turning from your back to your side while in a flat bed without using bedrails?: A Little Help needed moving from lying on your back to sitting on the side of a flat bed without using bedrails?: A Little Help needed moving to and from a bed to a chair (including a wheelchair)?: A Little Help needed standing up from a chair using your arms (e.g., wheelchair or bedside chair)?: A Little Help needed to walk in hospital room?: A Little Help needed climbing 3-5 steps with a railing? : A Lot 6 Click Score: 17    End of Session Equipment Utilized During Treatment: Gait belt;Oxygen Activity Tolerance: Patient tolerated treatment well Patient left: in chair;with chair alarm set;with call bell/phone within reach Nurse Communication: Mobility status PT Visit Diagnosis: Unsteadiness on feet (R26.81);Difficulty in walking, not elsewhere classified (R26.2)     Time: 1340-1405 PT Time Calculation (min) (ACUTE ONLY): 25 min  Charges:  $Gait Training: 8-22 mins $Therapeutic Activity: 8-22 mins                     {Erika Hussar  PTA Acute  [employer] M-F          [phone]

## 2023-01-13 NOTE — Progress Notes (Signed)
PHYSICAL THERAPY  SATURATION QUALIFICATIONS: (This note is used to comply with regulatory documentation for home oxygen)  Patient Saturations on Room Air at Rest =87% HR 58  Patient Saturations on Room Air while Ambulating 30 feet= 84% HR 87  Patient Saturation on 2 lts at rest 90% Patient Saturations on 3 Liters of oxygen while Ambulating 50 feet= 90%  Please briefly explain why patient needs home oxygen:  pt requires supplemental oxygen to achieve therapeutic sats.  Pt requires 2 lts at rest and 3 lts with activity.  Educated pt on how to monitor using pulse ox (has one at home).  Felecia Shelling  PTA Acute  Rehabilitation Services Office M-F          [phone]

## 2023-01-13 NOTE — Progress Notes (Signed)
PROGRESS NOTE    Jeffrey Jeffrey [name]  [name]:[id number] DOB: 1945-10-18 DOA: 01/07/2023 PCP: [name]., MD    Brief Narrative:   Jeffrey Jeffrey [name] is a 77 y.o. male with past medical history significant for neuropathy, remote empyema s/p VATS 2009, cervical fusion, obesity who presented to [facility] ED on 01/07/2023 via EMS from home with progressive shortness of breath.  He was initially seen by his PCP on same day of ED presentation and diagnosed with pneumonia with chest x-ray concerning for right lower lobe infiltrate in which he was started on Levaquin.  He reports taking his first dose.  He then noted that his oxygen saturation was low and EMS was activated and patient was transported to the ED for further evaluation.  In the ED, temperature 99.0 F, HR 125, SpO2 82% on room air.  WBC 11.2, hemoglobin 11.9, platelets 212.  Sodium 134, potassium 3.6, chloride 101, CO2 22, glucose 167, BUN 18, creatinine 1.18.  AST 30, ALT 30, total bilirubin 0.8.  BNP 21.8.  Lactic acid 1.2.  Procalcitonin 1.95.  Patient was started on azithromycin and ceftriaxone.  EDP consulted [facility] for admission for further evaluation management of acute hypoxic respiratory failure secondary to pneumonia.  5/10: Admit, started on azithromycin/ceftriaxone; transfer to SDU for worsening respiratory status 5/11: PCCM consult, strep pneumo antigen +, azithromycin and steroids discontinued; remains on ceftriaxone 5/12: Oxygen requirements improving, down to 10 L HFNC 5/13: Oxygen requirement is down to 5 L [place], BP elevated, started hydralazine PRN; transfer to tele floor  5/14: Remains on 6 L nasal cannula, reports nosebleed with O2, starting saline nasal gel, on IV abx, slowly improving 5/15: On 6 L [place], remains on IV antibiotics day #6/7; restarting steroids Solu-Medrol 40 IV every 12, ambulatory O2 screen  Assessment & Plan:   Pneumococcal pneumonia; RLL Patient presenting to ED with 12-day history of progressive  shortness of breath, cough.  Seen by PCP and started on Levaquin although had significant desaturation outpatient which led to ED presentation.  Patient was afebrile but with elevated WBC count of 11.2.  Noted SpO2 82% on room air on arrival.  Strep pneumo antigen positive.  COVID-19/influenza PCR negative.  BNP within normal limits.  CT angiogram chest negative for pulmonary embolism with infiltrates right lower lobe.  PCCM was initially following and now signed off. -- WBC 11.2>>18.8>9.7>6.8 -- PCT  2.09>>0.46 -- Ceftriaxone 2 g IV every 24 hours day #7/7 -- Solu-Medrol 40 mg IV every 12 hours; transition to prednisone tomorrow -- Flonase/Claritin -- DuoNeb 3 times daily -- Albuterol neb every 2 hours as needed wheezing/SOB -- Incentive spirometry/flutter valve -- Continue supplemental oxygen, maintain SpO2 > 88%, down to 2L [place] this am with SpO2 93% at rest -- Ambulatory O2 screen today -- Repeat CBC, procalcitonin in a.m.  Elevated blood pressure Not on antihypertensives outpatient. -- Hydralazine 25mg  PO q6h PRN SBP >165 -- If blood pressure remains elevated, may need to consider initiation of antihypertensive therapy  Neuropathy -- Gabapentin 300 mg p.o. twice daily, 600 mg p.o. nightly  Questional history of pulmonary fibrosis No evidence of ILD noted on CT angiogram chest. -- Outpatient follow-up with pulmonology  Insomnia -- Trazodone 150 mg p.o. nightly PRN -- Melatonin 10 mg p.o. nightly  Obesity Body mass index is 37.1 kg/m.  Discussed with patient needs for aggressive lifestyle changes/weight loss as this complicates all facets of care.  Outpatient follow-up with PCP.    Weakness/debility/gait disturbance/deconditioning: -- PT/OT  evaluation: Recommending home health PT/OT, rolling walker -- Continue therapy efforts while inpatient   DVT prophylaxis: SCDs Start: 01/07/23 2356    Code Status: Full Code Family Communication: No family present at bedside this  morning  Disposition Plan:  Level of care: Telemetry Status is: Inpatient Remains inpatient appropriate because: IV antibiotics, remains on oxygen    Consultants:  PCCM  Procedures:  None  Antimicrobials:  Azithromycin 5/10 - 5/10 Ceftriaxone 5/10>>   Subjective: Patient seen examined bedside, resting comfortably.  Sitting in bedside chair.  RT present, receiving breathing treatment.  Oxygen now titrated down to 2 L nasal cannula.  Reports ambulated in the hallway yesterday.  Will receive final dose of antibiotics this evening.  Discussed may need home oxygen when he returns home; and will recheck ambulatory O2 screen today. Continue to encourage utilization of flutter valve/incentive spirometry. No other specific questions or concerns at this time.  Denies headache, no dizziness, no chest pain, no palpitations, no abdominal pain, no fever/chills/night sweats, no nausea/vomiting/diarrhea, no focal weakness, no fatigue, no cough, no paresthesias.  No acute events overnight per nursing staff.    Objective: Vitals:   01/12/23 2106 01/13/23 0423 01/13/23 0600 01/13/23 0842  BP: (!) 149/83 (!) 161/91    Pulse: 84 70    Resp: 19     Temp: 98.5 F (36.9 C) 97.7 F (36.5 C)    TempSrc: Oral Oral    SpO2: 91% 93%  93%  Weight:   120.7 kg   Height:        Intake/Output Summary (Last 24 hours) at 01/13/2023 1011 Last data filed at 01/13/2023 0805 Gross per 24 hour  Intake 936 ml  Output 975 ml  Net -39 ml   Filed Weights   01/11/23 0500 01/12/23 0500 01/13/23 0600  Weight: 121.3 kg 124.4 kg 120.7 kg    Examination:  Physical Exam: GEN: NAD, alert and oriented x 3, obese HEENT: NCAT, PERRL, EOMI, sclera clear, MMM PULM: Crackles noted right base, soft late expiratory wheezing upper lung fields, normal respiratory effort without accessory muscle use, on 2 L [place] with SpO2 93% at rest CV: RRR w/o M/G/R GI: abd soft, NTND, NABS, no R/G/M MSK: no peripheral edema, moves all  extremities independently NEURO: CN II-XII intact, no focal deficits, sensation to light touch intact PSYCH: normal mood/affect Integumentary: dry/intact, no rashes or wounds    Data Reviewed: I have personally reviewed following labs and imaging studies  CBC: Recent Labs  Lab 01/07/23 1700 01/08/23 0254 01/09/23 0257 01/10/23 0256 01/11/23 0421  WBC 11.2* 14.1* 18.8* 9.7 6.8  NEUTROABS 9.5* 13.1* 16.9*  --   --   HGB 11.9* 11.1* 11.4* 11.3* 11.3*  HCT 35.9* 34.6* 35.9* 35.7* 36.0*  MCV 95.5 96.9 98.9 100.3* 99.4  PLT 212 208 217 247 222   Basic Metabolic Panel: Recent Labs  Lab 01/07/23 1700 01/08/23 0254 01/09/23 0257 01/10/23 0256 01/11/23 0421  NA 134* 134* 136 138 140  K 3.6 4.1 4.5 4.3 4.3  CL 101 100 103 102 101  CO2 22 23 23 26  32  GLUCOSE 167* 177* 152* 114* 104*  BUN 18 19 27* 29* 26*  CREATININE 1.18 1.19 1.20 1.14 1.03  CALCIUM 8.5* 8.1* 8.3* 8.3* 8.3*  MG  --  1.7  --   --  2.2   GFR: Estimated Creatinine Clearance: 82 mL/min (by C-G formula based on SCr of 1.03 mg/dL). Liver Function Tests: Recent Labs  Lab 01/07/23 1700 01/08/23 0254  AST 30 30  ALT 30 28  ALKPHOS 37* 31*  BILITOT 0.8 0.6  PROT 7.6 7.1  ALBUMIN 4.0 3.8   No results for input(s): "LIPASE", "AMYLASE" in the last 168 hours. No results for input(s): "AMMONIA" in the last 168 hours. Coagulation Profile: No results for input(s): "INR", "PROTIME" in the last 168 hours. Cardiac Enzymes: No results for input(s): "CKTOTAL", "CKMB", "CKMBINDEX", "TROPONINI" in the last 168 hours. BNP (last 3 results) No results for input(s): "PROBNP" in the last 8760 hours. HbA1C: No results for input(s): "HGBA1C" in the last 72 hours. CBG: No results for input(s): "GLUCAP" in the last 168 hours. Lipid Profile: No results for input(s): "CHOL", "HDL", "LDLCALC", "TRIG", "CHOLHDL", "LDLDIRECT" in the last 72 hours. Thyroid Function Tests: No results for input(s): "TSH", "T4TOTAL", "FREET4",  "T3FREE", "THYROIDAB" in the last 72 hours. Anemia Panel: No results for input(s): "VITAMINB12", "FOLATE", "FERRITIN", "TIBC", "IRON", "RETICCTPCT" in the last 72 hours. Sepsis Labs: Recent Labs  Lab 01/08/23 0746 01/09/23 0257 01/10/23 0256 01/11/23 0421  PROCALCITON 2.09 1.90 1.12 0.46  LATICACIDVEN 1.2  --   --   --     Recent Results (from the past 240 hour(s))  Resp panel by RT-PCR (RSV, Flu A&B, Covid) Anterior Nasal Swab     Status: None   Collection Time: 01/07/23  5:00 PM   Specimen: Anterior Nasal Swab  Result Value Ref Range Status   SARS Coronavirus 2 by RT PCR NEGATIVE NEGATIVE Final    Comment: (NOTE) SARS-CoV-2 target nucleic acids are NOT DETECTED.  The SARS-CoV-2 RNA is generally detectable in upper respiratory specimens during the acute phase of infection. The lowest concentration of SARS-CoV-2 viral copies this assay can detect is 138 copies/mL. A negative result does not preclude SARS-Cov-2 infection and should not be used as the sole basis for treatment or other patient management decisions. A negative result may occur with  improper specimen collection/handling, submission of specimen other than nasopharyngeal swab, presence of viral mutation(s) within the areas targeted by this assay, and inadequate number of viral copies(<138 copies/mL). A negative result must be combined with clinical observations, patient history, and epidemiological information. The expected result is Negative.  Fact Sheet for Patients:  BloggerCourse.com  Fact Sheet for Healthcare Providers:  SeriousBroker.it  This test is no t yet approved or cleared by the [place] FDA and  has been authorized for detection and/or diagnosis of SARS-CoV-2 by FDA under an Emergency Use Authorization (EUA). This EUA will remain  in effect (meaning this test can be used) for the duration of the COVID-19 declaration under Section 564(b)(1) of  the Act, 21 U.S.C.section 360bbb-3(b)(1), unless the authorization is terminated  or revoked sooner.       Influenza A by PCR NEGATIVE NEGATIVE Final   Influenza B by PCR NEGATIVE NEGATIVE Final    Comment: (NOTE) The Xpert Xpress SARS-CoV-2/FLU/RSV plus assay is intended as an aid in the diagnosis of influenza from Nasopharyngeal swab specimens and should not be used as a sole basis for treatment. Nasal washings and aspirates are unacceptable for Xpert Xpress SARS-CoV-2/FLU/RSV testing.  Fact Sheet for Patients: BloggerCourse.com  Fact Sheet for Healthcare Providers: SeriousBroker.it  This test is not yet approved or cleared by the [place] FDA and has been authorized for detection and/or diagnosis of SARS-CoV-2 by FDA under an Emergency Use Authorization (EUA). This EUA will remain in effect (meaning this test can be used) for the duration of the COVID-19 declaration under Section 564(b)(1) of the  Act, 21 U.S.C. section 360bbb-3(b)(1), unless the authorization is terminated or revoked.     Resp Syncytial Virus by PCR NEGATIVE NEGATIVE Final    Comment: (NOTE) Fact Sheet for Patients: BloggerCourse.com  Fact Sheet for Healthcare Providers: SeriousBroker.it  This test is not yet approved or cleared by the [place] FDA and has been authorized for detection and/or diagnosis of SARS-CoV-2 by FDA under an Emergency Use Authorization (EUA). This EUA will remain in effect (meaning this test can be used) for the duration of the COVID-19 declaration under Section 564(b)(1) of the Act, 21 U.S.C. section 360bbb-3(b)(1), unless the authorization is terminated or revoked.  Performed at [facility], [address] [place], [place] [address]   MRSA Next Gen by PCR, Nasal     Status: None   Collection Time: 01/08/23  2:08 AM   Specimen: Nasal Mucosa;  Nasal Swab  Result Value Ref Range Status   MRSA by PCR Next Gen NOT DETECTED NOT DETECTED Final    Comment: (NOTE) The GeneXpert MRSA Assay (FDA approved for NASAL specimens only), is one component of a comprehensive MRSA colonization surveillance program. It is not intended to diagnose MRSA infection nor to guide or monitor treatment for MRSA infections. Test performance is not FDA approved in patients less than 40 years old. Performed at [facility], [address], [place], [place] [address]          Radiology Studies: DG CHEST PORT 1 VIEW  Result Date: 01/13/2023 CLINICAL DATA:  Shortness of breath. EXAM: PORTABLE CHEST 1 VIEW COMPARISON:  01/10/2023 FINDINGS: Low lung volumes. There is minimal subsegmental atelectasis at the lung bases, RIGHT greater than LEFT and slightly increased. No consolidations. No evidence for pulmonary edema. IMPRESSION: Slightly increased bibasilar atelectasis.  Low lung volumes. Electronically Signed   By: Norva Pavlov M.D.   On: 01/13/2023 08:25        Scheduled Meds:  arformoterol  15 mcg Nebulization BID   budesonide (PULMICORT) nebulizer solution  0.5 mg Nebulization BID   docusate sodium  100 mg Oral BID   enoxaparin (LOVENOX) injection  60 mg Subcutaneous Q24H   fluticasone  2 spray Each Nare Daily   gabapentin  300 mg Oral BID WC   gabapentin  600 mg Oral QHS   loratadine  10 mg Oral Daily   melatonin  10 mg Oral QHS   methylPREDNISolone (SOLU-MEDROL) injection  40 mg Intravenous Q12H   revefenacin  175 mcg Nebulization Daily   saline  1 Application Each Nare TID   Continuous Infusions:  cefTRIAXone (ROCEPHIN)  IV Stopped (01/12/23 2223)     LOS: 5 days    Time spent: 52 minutes spent on chart review, discussion with nursing staff, consultants, updating family and interview/physical exam; more than 50% of that time was spent in counseling and/or coordination of care.    Alvira Philips [place], DO [facility] Available via Epic secure chat 7am-7pm After these hours, please refer to coverage provider listed on [email or web address] 01/13/2023, 10:11 AM

## 2023-01-14 DIAGNOSIS — J13 Pneumonia due to Streptococcus pneumoniae: Secondary | ICD-10-CM | POA: Diagnosis not present

## 2023-01-14 LAB — CBC
HCT: 36.4 % — ABNORMAL LOW (ref 39.0–52.0)
Hemoglobin: 12.1 g/dL — ABNORMAL LOW (ref 13.0–17.0)
MCH: 32 pg (ref 26.0–34.0)
MCHC: 33.2 g/dL (ref 30.0–36.0)
MCV: 96.3 fL (ref 80.0–100.0)
Platelets: 295 10*3/uL (ref 150–400)
RBC: 3.78 MIL/uL — ABNORMAL LOW (ref 4.22–5.81)
RDW: 13.6 % (ref 11.5–15.5)
WBC: 10.9 10*3/uL — ABNORMAL HIGH (ref 4.0–10.5)
nRBC: 0 % (ref 0.0–0.2)

## 2023-01-14 LAB — MAGNESIUM: Magnesium: 2.3 mg/dL (ref 1.7–2.4)

## 2023-01-14 LAB — BASIC METABOLIC PANEL
Anion gap: 6 (ref 5–15)
BUN: 34 mg/dL — ABNORMAL HIGH (ref 8–23)
CO2: 29 mmol/L (ref 22–32)
Calcium: 8.6 mg/dL — ABNORMAL LOW (ref 8.9–10.3)
Chloride: 103 mmol/L (ref 98–111)
Creatinine, Ser: 1.09 mg/dL (ref 0.61–1.24)
GFR, Estimated: >60 mL/min (ref >60)
Glucose, Bld: 144 mg/dL — ABNORMAL HIGH (ref 70–99)
Potassium: 4.8 mmol/L (ref 3.5–5.1)
Sodium: 138 mmol/L (ref 135–145)

## 2023-01-14 LAB — PROCALCITONIN: Procalcitonin: <0.1 ng/mL

## 2023-01-14 MED ORDER — PREDNISONE 10 MG PO TABS: ORAL_TABLET | ORAL | 0 refills | Status: AC

## 2023-01-14 MED ORDER — ARFORMOTEROL TARTRATE 15 MCG/2ML IN NEBU: 15.0000 ug | INHALATION_SOLUTION | Freq: Two times a day (BID) | RESPIRATORY_TRACT | 0 refills | Status: DC

## 2023-01-14 MED ORDER — REVEFENACIN 175 MCG/3ML IN SOLN: 175.0000 ug | Freq: Every day | RESPIRATORY_TRACT | 0 refills | Status: DC

## 2023-01-14 NOTE — TOC Transition Note (Addendum)
Transition of Care [facility]) - CM/SW Discharge Note   Patient Details  Name: Jeffrey [name] MRN: [id number] Date of Birth: 03/03/1946  Transition of [facility]) CM/SW Contact:  Howell Rucks, RN Phone Number: 01/14/2023, 11:17 AM   Clinical Narrative:   DC Home today, [employer] repVaughan Basta to deliver 02 to room. No further TOC needs identified.  - 3:36PM Teams chat from nurse report pt dc with prescription or home nebulizer machine, spouse reports unable to obtain at pharmacy. [employer] rep-Jermaine to deliver to pt's home.   Final next level of care: Home w Home Health Services Barriers to Discharge: Barriers Resolved   Patient Goals and CMS Choice CMS Medicare.gov Compare Post Acute Care list provided to:: Patient Choice offered to / list presented to : Patient  Discharge Placement                         Discharge Plan and Services Additional resources added to the After Visit Summary for     Discharge Planning Services: CM Consult            DME Arranged: Dan Humphreys rolling DME Agency: [employer] Date DME Agency Contacted: 01/14/23 Time DME Agency Contacted: [phone] Representative spoke with at DME Agency: Vaughan Basta HH Arranged: PT, OT HH Agency: [facility] Date [facility] Agency Contacted: 01/11/23 Time HH Agency Contacted: [phone] Representative spoke with at [facility] Agency: Amy  Social Determinants of Health (SDOH) Interventions SDOH Screenings   Food Insecurity: No Food Insecurity (01/10/2023)  Housing: Low Risk  (01/10/2023)  Transportation Needs: No Transportation Needs (01/10/2023)  Utilities: Not At Risk (01/10/2023)  Tobacco Use: Low Risk  (01/10/2023)     Readmission Risk Interventions    01/12/2023    2:27 PM 01/11/2023   12:29 PM  Readmission Risk Prevention Plan  Post Dischage Appt  Complete  Medication Screening  Complete  Transportation Screening Complete Complete  PCP or Specialist Appt within 5-7 Days Complete   Home Care Screening Complete

## 2023-01-14 NOTE — Discharge Summary (Signed)
Physician Discharge Summary  Jeffrey [name] FAO:[id number] DOB: Oct 01, 1945 DOA: 01/07/2023  PCP: [name]., MD  Admit date: 01/07/2023 Discharge date: 01/14/2023 30 Day Unplanned Readmission Risk Score    Flowsheet Row ED to Hosp-Admission (Current) from 01/07/2023 in [facility] 4TH FLOOR PROGRESSIVE CARE AND UROLOGY  30 Day Unplanned Readmission Risk Score (%) 15.76 Filed at 01/14/2023 0801       This score is the patient's risk of an unplanned readmission within 30 days of being discharged (0 -100%). The score is based on dignosis, age, lab data, medications, orders, and past utilization.   Low:  0-14.9   Medium: 15-21.9   High: 22-29.9   Extreme: 30 and above          Admitted From: Home Disposition: Home  Recommendations for Outpatient Follow-up:  Follow up with PCP in 1-2 weeks Please obtain BMP/CBC in one week Follow-up with pulmonology in 4 weeks if needed, seek referral from PCP. Please follow up with your PCP on the following pending results: Unresulted Labs (From admission, onward)    None         Home Health: Yes Equipment/Devices: Home oxygen  Discharge Condition: Stable CODE STATUS: Full code Diet recommendation: Cardiac  Subjective: Seen and examined.  He says that he has no shortness of breath although he is still requiring 2 L of oxygen.  He was given the option to stay another night and check his oxygen saturation tomorrow and that there is a chance that he might not need to go home on oxygen but he prefers to go home today even if he has to go home with oxygen.  Brief/Interim Summary: Jeffrey [name] is a 77 y.o. male with past medical history significant for neuropathy, remote empyema s/p VATS 2009, cervical fusion, obesity who presented to [facility] ED on 01/07/2023 via EMS from home with progressive shortness of breath.  He was initially seen by his PCP on same day of ED presentation and diagnosed with pneumonia with chest x-ray  concerning for right lower lobe infiltrate in which he was started on Levaquin.  He reports taking his first dose.  He then noted that his oxygen saturation was low and EMS was activated and patient was transported to the ED for further evaluation.   In the ED, temperature 99.0 F, HR 125, SpO2 82% on room air.  WBC 11.2, hemoglobin 11.9, platelets 212.  Sodium 134, potassium 3.6, chloride 101, CO2 22, glucose 167, BUN 18, creatinine 1.18.  AST 30, ALT 30, total bilirubin 0.8.  BNP 21.8.  Lactic acid 1.2.  Procalcitonin 1.95.  Patient was started on azithromycin and ceftriaxone.  He was admitted under hospitalist service for acute hypoxic respiratory failure secondary to community-acquired pneumonia and later was found to have positive streptococcal antigen for urine.  He also received IV Solu-Medrol and pulmonary was consulted.  He was on ceftriaxone which she completed course for 7 days.  Although his oxygen saturation improved but he was still requiring 2 L oxygen with exertion.  Due to this, he was resumed back on Solu-Medrol which was transitioned to oral prednisone yesterday.  His procalcitonin has improved.  Patient desires to go home, he understands that he will have to go home if he decides to go home today.  He has already made up his mind.  I am going to send him home on tapering dose of prednisone.  Per notes from PCCM, there was some concern of pulmonary fibrosis in  the past.  They recommended that he should follow-up with pulmonology if desired.    Elevated blood pressure Not on antihypertensives outpatient. -- Hydralazine 25mg  PO q6h PRN SBP >165 Blood pressure stabilized.   Neuropathy -- Gabapentin 300 mg p.o. twice daily, 600 mg p.o. nightly   Questional history of pulmonary fibrosis No evidence of ILD noted on CT angiogram chest. -- Outpatient follow-up with pulmonology   Insomnia -- Trazodone 150 mg p.o. nightly PRN -- Melatonin 10 mg p.o. nightly   Obesity Body mass index is  37.1 kg/m.  Discussed with patient needs for aggressive lifestyle changes/weight loss as this complicates all facets of care.  Outpatient follow-up with PCP.     Weakness/debility/gait disturbance/deconditioning: -- PT/OT evaluation: Recommending home health PT/OT, rolling walker -- Continue therapy efforts while inpatient  Discharge plan was discussed with patient and/or family member and they verbalized understanding and agreed with it.  Discharge Diagnoses:  Principal Problem:   Pneumococcal pneumonia (HCC) Active Problems:   Acute hypoxemic respiratory failure (HCC)   Polyneuropathy   Class 2 obesity due to excess calories with body mass index (BMI) of 37.0 to 37.9 in adult    Discharge Instructions   Allergies as of 01/14/2023   No Known Allergies      Medication List     STOP taking these medications    docusate sodium 100 MG capsule Commonly known as: COLACE   levofloxacin 500 MG tablet Commonly known as: LEVAQUIN   methocarbamol 500 MG tablet Commonly known as: ROBAXIN       TAKE these medications    albuterol 108 (90 Base) MCG/ACT inhaler Commonly known as: VENTOLIN HFA Inhale 2 puffs into the lungs every 6 (six) hours as needed for wheezing or shortness of breath.   arformoterol 15 MCG/2ML Nebu Commonly known as: BROVANA Take 2 mLs (15 mcg total) by nebulization 2 (two) times daily.   B-complex with vitamin C tablet Take 1 tablet by mouth in the morning and at bedtime.   gabapentin 300 MG capsule Commonly known as: NEURONTIN One po qAM, one po qPM and to po qHS What changed:  how much to take how to take this when to take this additional instructions   multivitamin with minerals Tabs tablet Take 1 tablet by mouth daily.   polycarbophil 625 MG tablet Commonly known as: FIBERCON Take 625 mg by mouth in the morning and at bedtime.   predniSONE 10 MG tablet Commonly known as: DELTASONE Take 4 tablets (40 mg total) by mouth daily for 3  days, THEN 3 tablets (30 mg total) daily for 3 days, THEN 2 tablets (20 mg total) daily for 3 days, THEN 1 tablet (10 mg total) daily for 3 days. Start taking on: Jan 14, 2023   PROBIOTIC PO Take 1 tablet by mouth daily.   revefenacin 175 MCG/3ML nebulizer solution Commonly known as: YUPELRI Take 3 mLs (175 mcg total) by nebulization daily. Start taking on: Jan 15, 2023   Trelegy Ellipta 100-62.5-25 MCG/ACT Aepb Generic drug: Fluticasone-Umeclidin-Vilant Inhale 1 puff into the lungs daily.   VITAMIN D3 PO Take 1 tablet by mouth daily.               Durable Medical Equipment  (From admission, onward)           Start     Ordered   01/14/23 0949  For home use only DME oxygen  Once       Question Answer Comment  Length of Need  Lifetime   Mode or (Route) Nasal cannula   Liters per Minute 2   Frequency Continuous (stationary and portable oxygen unit needed)   Oxygen delivery system Gas      01/14/23 0949   01/11/23 1236  For home use only DME Walker  Once       Question:  Patient needs a walker to treat with the following condition  Answer:  Debility   01/11/23 1238            Follow-up Information     [facility] Follow up.   Why: Home Health Physical Therapy Home Health Occupational Therapy Contact information: [address] [place] [address] [phone]         [facility] Follow up.   [facility] [place], [place]  [phone]  Rolling Weston Brass., MD Follow up in 1 week(s).   Specialty: Internal Medicine Contact information: [address] [place] [address] [phone]                No Known Allergies  Consultations: Pulmonology   Procedures/Studies: DG CHEST PORT 1 VIEW  Result Date: 01/13/2023 CLINICAL DATA:  Shortness of breath. EXAM: PORTABLE CHEST 1 VIEW COMPARISON:  01/10/2023 FINDINGS: Low lung volumes. There is minimal subsegmental atelectasis at  the lung bases, RIGHT greater than LEFT and slightly increased. No consolidations. No evidence for pulmonary edema. IMPRESSION: Slightly increased bibasilar atelectasis.  Low lung volumes. Electronically Signed   By: Norva Pavlov M.D.   On: 01/13/2023 08:25   DG Chest Port 1 View  Result Date: 01/10/2023 CLINICAL DATA:  Acute respiratory failure. EXAM: PORTABLE CHEST 1 VIEW COMPARISON:  01/07/2023 FINDINGS: Heart size is normal. The lungs are clear. There is no pulmonary edema. No focal consolidations. There is minimal subsegmental atelectasis at the LATERAL RIGHT lung base. Remote cervical fusion. IMPRESSION: Minimal subsegmental atelectasis at the LATERAL RIGHT lung base. Electronically Signed   By: Norva Pavlov M.D.   On: 01/10/2023 10:21   CT Angio Chest PE W and/or Wo Contrast  Result Date: 01/07/2023 CLINICAL DATA:  Pulmonary embolism suspected, high probability. Shortness of breath. EXAM: CT ANGIOGRAPHY CHEST WITH CONTRAST TECHNIQUE: Multidetector CT imaging of the chest was performed using the standard protocol during bolus administration of intravenous contrast. Multiplanar CT image reconstructions and MIPs were obtained to evaluate the vascular anatomy. RADIATION DOSE REDUCTION: This exam was performed according to the departmental dose-optimization program which includes automated exposure control, adjustment of the mA and/or kV according to patient size and/or use of iterative reconstruction technique. CONTRAST:  OMNIPAQUE IOHEXOL 350 MG/ML SOLN COMPARISON:  09/07/2012. FINDINGS: Cardiovascular: The heart is normal in size and there is no pericardial effusion. Multi-vessel coronary artery calcifications are noted. There is atherosclerotic calcification of the aorta without evidence of aneurysm. Pulmonary trunk is normal in caliber. No definite evidence of pulmonary embolism. Mediastinum/Nodes: Enlarged lymph nodes are noted in the mediastinum measuring up to 1 point 4 cm in the left  paratracheal space. No hilar or axillary lymphadenopathy. The thyroid gland, trachea, and esophagus are within normal limits. Lungs/Pleura: Bronchial wall thickening is present bilaterally. A few scattered ground-glass opacities are present bilaterally. Patchy atelectasis or infiltrate is noted in the right lower lobe. Scattered atelectasis or scarring is present bilaterally. No effusion or pneumothorax. Upper Abdomen: There is a cyst in the posterior right lobe of the liver. Fatty infiltration of the  liver is noted. A cyst is present in the left kidney. No adrenal nodule. No acute abnormality. Musculoskeletal: Cervical spinal fusion hardware is noted. No acute osseous abnormality is seen. Review of the MIP images confirms the above findings. IMPRESSION: 1. No evidence of pulmonary embolism. 2. Bronchial wall thickening bilaterally with patchy atelectasis or infiltrate in the right lower lobe. 3. Hepatic steatosis. 4. Aortic atherosclerosis and coronary artery calcifications. Electronically Signed   By: Thornell Sartorius M.D.   On: 01/07/2023 21:13   DG Chest Port 1 View  Result Date: 01/07/2023 CLINICAL DATA:  Shortness of breath EXAM: PORTABLE CHEST 1 VIEW COMPARISON:  Chest x-ray 11/06/2008 FINDINGS: The heart size and mediastinal contours are within normal limits. Both lungs are clear. There are no acute fractures. Cervical spinal fusion plate is present. IMPRESSION: No active disease. Electronically Signed   By: Darliss Cheney M.D.   On: 01/07/2023 17:48     Discharge Exam: Vitals:   01/14/23 0806 01/14/23 0808  BP:    Pulse:    Resp: 17   Temp:    SpO2: 95% 95%   Vitals:   01/14/23 0455 01/14/23 0500 01/14/23 0806 01/14/23 0808  BP: 123/80     Pulse: (!) 59     Resp: 20  17   Temp: 97.7 F (36.5 C)     TempSrc: Oral     SpO2: 92%  95% 95%  Weight:  120 kg    Height:        General: Pt is alert, awake, not in acute distress Cardiovascular: RRR, S1/S2 +, no rubs, no gallops Respiratory:  Rhonchi bilaterally Abdominal: Soft, NT, ND, bowel sounds + Extremities: no edema, no cyanosis    The results of significant diagnostics from this hospitalization (including imaging, microbiology, ancillary and laboratory) are listed below for reference.     Microbiology: Recent Results (from the past 240 hour(s))  Resp panel by RT-PCR (RSV, Flu A&B, Covid) Anterior Nasal Swab     Status: None   Collection Time: 01/07/23  5:00 PM   Specimen: Anterior Nasal Swab  Result Value Ref Range Status   SARS Coronavirus 2 by RT PCR NEGATIVE NEGATIVE Final    Comment: (NOTE) SARS-CoV-2 target nucleic acids are NOT DETECTED.  The SARS-CoV-2 RNA is generally detectable in upper respiratory specimens during the acute phase of infection. The lowest concentration of SARS-CoV-2 viral copies this assay can detect is 138 copies/mL. A negative result does not preclude SARS-Cov-2 infection and should not be used as the sole basis for treatment or other patient management decisions. A negative result may occur with  improper specimen collection/handling, submission of specimen other than nasopharyngeal swab, presence of viral mutation(s) within the areas targeted by this assay, and inadequate number of viral copies(<138 copies/mL). A negative result must be combined with clinical observations, patient history, and epidemiological information. The expected result is Negative.  Fact Sheet for Patients:  BloggerCourse.com  Fact Sheet for Healthcare Providers:  SeriousBroker.it  This test is no t yet approved or cleared by the [place] FDA and  has been authorized for detection and/or diagnosis of SARS-CoV-2 by FDA under an Emergency Use Authorization (EUA). This EUA will remain  in effect (meaning this test can be used) for the duration of the COVID-19 declaration under Section 564(b)(1) of the Act, 21 U.S.C.section 360bbb-3(b)(1), unless the  authorization is terminated  or revoked sooner.       Influenza A by PCR NEGATIVE NEGATIVE Final   Influenza B  by PCR NEGATIVE NEGATIVE Final    Comment: (NOTE) The Xpert Xpress SARS-CoV-2/FLU/RSV plus assay is intended as an aid in the diagnosis of influenza from Nasopharyngeal swab specimens and should not be used as a sole basis for treatment. Nasal washings and aspirates are unacceptable for Xpert Xpress SARS-CoV-2/FLU/RSV testing.  Fact Sheet for Patients: BloggerCourse.com  Fact Sheet for Healthcare Providers: SeriousBroker.it  This test is not yet approved or cleared by the [place] FDA and has been authorized for detection and/or diagnosis of SARS-CoV-2 by FDA under an Emergency Use Authorization (EUA). This EUA will remain in effect (meaning this test can be used) for the duration of the COVID-19 declaration under Section 564(b)(1) of the Act, 21 U.S.C. section 360bbb-3(b)(1), unless the authorization is terminated or revoked.     Resp Syncytial Virus by PCR NEGATIVE NEGATIVE Final    Comment: (NOTE) Fact Sheet for Patients: BloggerCourse.com  Fact Sheet for Healthcare Providers: SeriousBroker.it  This test is not yet approved or cleared by the [place] FDA and has been authorized for detection and/or diagnosis of SARS-CoV-2 by FDA under an Emergency Use Authorization (EUA). This EUA will remain in effect (meaning this test can be used) for the duration of the COVID-19 declaration under Section 564(b)(1) of the Act, 21 U.S.C. section 360bbb-3(b)(1), unless the authorization is terminated or revoked.  Performed at [facility], [address], [place], [place] [address]   MRSA Next Gen by PCR, Nasal     Status: None   Collection Time: 01/08/23  2:08 AM   Specimen: Nasal Mucosa; Nasal Swab  Result Value Ref Range Status   MRSA by PCR  Next Gen NOT DETECTED NOT DETECTED Final    Comment: (NOTE) The GeneXpert MRSA Assay (FDA approved for NASAL specimens only), is one component of a comprehensive MRSA colonization surveillance program. It is not intended to diagnose MRSA infection nor to guide or monitor treatment for MRSA infections. Test performance is not FDA approved in patients less than 59 years old. Performed at [facility], [address], [place], [place] [address]      Labs: BNP (last 3 results) Recent Labs    01/07/23 1700  BNP 21.8   Basic Metabolic Panel: Recent Labs  Lab 01/08/23 0254 01/09/23 0257 01/10/23 0256 01/11/23 0421 01/14/23 0354  NA 134* 136 138 140 138  K 4.1 4.5 4.3 4.3 4.8  CL 100 103 102 101 103  CO2 23 23 26  32 29  GLUCOSE 177* 152* 114* 104* 144*  BUN 19 27* 29* 26* 34*  CREATININE 1.19 1.20 1.14 1.03 1.09  CALCIUM 8.1* 8.3* 8.3* 8.3* 8.6*  MG 1.7  --   --  2.2 2.3   Liver Function Tests: Recent Labs  Lab 01/07/23 1700 01/08/23 0254  AST 30 30  ALT 30 28  ALKPHOS 37* 31*  BILITOT 0.8 0.6  PROT 7.6 7.1  ALBUMIN 4.0 3.8   No results for input(s): "LIPASE", "AMYLASE" in the last 168 hours. No results for input(s): "AMMONIA" in the last 168 hours. CBC: Recent Labs  Lab 01/07/23 1700 01/08/23 0254 01/09/23 0257 01/10/23 0256 01/11/23 0421 01/14/23 0354  WBC 11.2* 14.1* 18.8* 9.7 6.8 10.9*  NEUTROABS 9.5* 13.1* 16.9*  --   --   --   HGB 11.9* 11.1* 11.4* 11.3* 11.3* 12.1*  HCT 35.9* 34.6* 35.9* 35.7* 36.0* 36.4*  MCV 95.5 96.9 98.9 100.3* 99.4 96.3  PLT 212 208 217 247 222 295   Cardiac  Enzymes: No results for input(s): "CKTOTAL", "CKMB", "CKMBINDEX", "TROPONINI" in the last 168 hours. BNP: Invalid input(s): "POCBNP" CBG: No results for input(s): "GLUCAP" in the last 168 hours. D-Dimer No results for input(s): "DDIMER" in the last 72 hours. Hgb A1c No results for input(s): "HGBA1C" in the last 72 hours. Lipid Profile No results  for input(s): "CHOL", "HDL", "LDLCALC", "TRIG", "CHOLHDL", "LDLDIRECT" in the last 72 hours. Thyroid function studies No results for input(s): "TSH", "T4TOTAL", "T3FREE", "THYROIDAB" in the last 72 hours.  Invalid input(s): "FREET3" Anemia work up No results for input(s): "VITAMINB12", "FOLATE", "FERRITIN", "TIBC", "IRON", "RETICCTPCT" in the last 72 hours. Urinalysis    Component Value Date/Time   COLORURINE YELLOW 08/12/2008 0830   APPEARANCEUR CLOUDY (A) 08/12/2008 0830   LABSPEC 1.022 08/12/2008 0830   PHURINE 5.5 08/12/2008 0830   GLUCOSEU NEGATIVE 08/12/2008 0830   HGBUR NEGATIVE 08/12/2008 0830   BILIRUBINUR NEGATIVE 08/12/2008 0830   KETONESUR NEGATIVE 08/12/2008 0830   PROTEINUR 100 (A) 08/12/2008 0830   UROBILINOGEN 1.0 08/12/2008 0830   NITRITE NEGATIVE 08/12/2008 0830   LEUKOCYTESUR NEGATIVE 08/12/2008 0830   Sepsis Labs Recent Labs  Lab 01/09/23 0257 01/10/23 0256 01/11/23 0421 01/14/23 0354  WBC 18.8* 9.7 6.8 10.9*   Microbiology Recent Results (from the past 240 hour(s))  Resp panel by RT-PCR (RSV, Flu A&B, Covid) Anterior Nasal Swab     Status: None   Collection Time: 01/07/23  5:00 PM   Specimen: Anterior Nasal Swab  Result Value Ref Range Status   SARS Coronavirus 2 by RT PCR NEGATIVE NEGATIVE Final    Comment: (NOTE) SARS-CoV-2 target nucleic acids are NOT DETECTED.  The SARS-CoV-2 RNA is generally detectable in upper respiratory specimens during the acute phase of infection. The lowest concentration of SARS-CoV-2 viral copies this assay can detect is 138 copies/mL. A negative result does not preclude SARS-Cov-2 infection and should not be used as the sole basis for treatment or other patient management decisions. A negative result may occur with  improper specimen collection/handling, submission of specimen other than nasopharyngeal swab, presence of viral mutation(s) within the areas targeted by this assay, and inadequate number of  viral copies(<138 copies/mL). A negative result must be combined with clinical observations, patient history, and epidemiological information. The expected result is Negative.  Fact Sheet for Patients:  BloggerCourse.com  Fact Sheet for Healthcare Providers:  SeriousBroker.it  This test is no t yet approved or cleared by the [place] FDA and  has been authorized for detection and/or diagnosis of SARS-CoV-2 by FDA under an Emergency Use Authorization (EUA). This EUA will remain  in effect (meaning this test can be used) for the duration of the COVID-19 declaration under Section 564(b)(1) of the Act, 21 U.S.C.section 360bbb-3(b)(1), unless the authorization is terminated  or revoked sooner.       Influenza A by PCR NEGATIVE NEGATIVE Final   Influenza B by PCR NEGATIVE NEGATIVE Final    Comment: (NOTE) The Xpert Xpress SARS-CoV-2/FLU/RSV plus assay is intended as an aid in the diagnosis of influenza from Nasopharyngeal swab specimens and should not be used as a sole basis for treatment. Nasal washings and aspirates are unacceptable for Xpert Xpress SARS-CoV-2/FLU/RSV testing.  Fact Sheet for Patients: BloggerCourse.com  Fact Sheet for Healthcare Providers: SeriousBroker.it  This test is not yet approved or cleared by the [place] FDA and has been authorized for detection and/or diagnosis of SARS-CoV-2 by FDA under an Emergency Use Authorization (EUA). This EUA will remain in effect (meaning  this test can be used) for the duration of the COVID-19 declaration under Section 564(b)(1) of the Act, 21 U.S.C. section 360bbb-3(b)(1), unless the authorization is terminated or revoked.     Resp Syncytial Virus by PCR NEGATIVE NEGATIVE Final    Comment: (NOTE) Fact Sheet for Patients: BloggerCourse.com  Fact Sheet for Healthcare  Providers: SeriousBroker.it  This test is not yet approved or cleared by the [place] FDA and has been authorized for detection and/or diagnosis of SARS-CoV-2 by FDA under an Emergency Use Authorization (EUA). This EUA will remain in effect (meaning this test can be used) for the duration of the COVID-19 declaration under Section 564(b)(1) of the Act, 21 U.S.C. section 360bbb-3(b)(1), unless the authorization is terminated or revoked.  Performed at [facility], [address] [place], [place] [address]   MRSA Next Gen by PCR, Nasal     Status: None   Collection Time: 01/08/23  2:08 AM   Specimen: Nasal Mucosa; Nasal Swab  Result Value Ref Range Status   MRSA by PCR Next Gen NOT DETECTED NOT DETECTED Final    Comment: (NOTE) The GeneXpert MRSA Assay (FDA approved for NASAL specimens only), is one component of a comprehensive MRSA colonization surveillance program. It is not intended to diagnose MRSA infection nor to guide or monitor treatment for MRSA infections. Test performance is not FDA approved in patients less than 108 years old. Performed at [facility], [address] [place], [place] [address]      Time coordinating discharge: Over 30 minutes  SIGNED:   Hughie Closs, MD  [facility] 01/14/2023, 9:56 AM *Please note that this is a verbal dictation therefore any spelling or grammatical errors are due to the "Dragon Medical One" system interpretation. If 7PM-7AM, please contact night-coverage [email or web address]

## 2023-01-14 NOTE — Progress Notes (Signed)
Mobility Specialist - Progress Note   01/14/23 1133  Oxygen Therapy  SpO2 90 %  O2 Device Nasal Cannula  O2 Flow Rate (L/min) 2 L/min  Patient Activity (if Appropriate) Ambulating  Mobility  Activity Ambulated with assistance in hallway  Level of Assistance Standby assist, set-up cues, supervision of patient - no hands on  Assistive Device Front wheel walker  Distance Ambulated (ft) 350 ft  Activity Response Tolerated well  Mobility Referral Yes  $Mobility charge 1 Mobility  Mobility Specialist Start Time (ACUTE ONLY) 1119  Mobility Specialist Stop Time (ACUTE ONLY) 1130  Mobility Specialist Time Calculation (min) (ACUTE ONLY) 11 min   Pt received in recliner and agreeable to mobility. No complaints during session. Pt to recliner after session with all needs met.   [facility]

## 2023-01-15 DIAGNOSIS — J13 Pneumonia due to Streptococcus pneumoniae: Secondary | ICD-10-CM | POA: Diagnosis not present

## 2023-01-15 DIAGNOSIS — Z9989 Dependence on other enabling machines and devices: Secondary | ICD-10-CM | POA: Diagnosis not present

## 2023-01-15 DIAGNOSIS — Z6837 Body mass index (BMI) 37.0-37.9, adult: Secondary | ICD-10-CM | POA: Diagnosis not present

## 2023-01-15 DIAGNOSIS — M1990 Unspecified osteoarthritis, unspecified site: Secondary | ICD-10-CM | POA: Diagnosis not present

## 2023-01-15 DIAGNOSIS — G629 Polyneuropathy, unspecified: Secondary | ICD-10-CM | POA: Diagnosis not present

## 2023-01-15 DIAGNOSIS — J189 Pneumonia, unspecified organism: Secondary | ICD-10-CM | POA: Diagnosis not present

## 2023-01-15 DIAGNOSIS — Z9981 Dependence on supplemental oxygen: Secondary | ICD-10-CM | POA: Diagnosis not present

## 2023-01-18 DIAGNOSIS — Z6837 Body mass index (BMI) 37.0-37.9, adult: Secondary | ICD-10-CM | POA: Diagnosis not present

## 2023-01-18 DIAGNOSIS — M1990 Unspecified osteoarthritis, unspecified site: Secondary | ICD-10-CM | POA: Diagnosis not present

## 2023-01-18 DIAGNOSIS — G629 Polyneuropathy, unspecified: Secondary | ICD-10-CM | POA: Diagnosis not present

## 2023-01-18 DIAGNOSIS — J13 Pneumonia due to Streptococcus pneumoniae: Secondary | ICD-10-CM | POA: Diagnosis not present

## 2023-01-18 DIAGNOSIS — J189 Pneumonia, unspecified organism: Secondary | ICD-10-CM | POA: Diagnosis not present

## 2023-01-19 DIAGNOSIS — G629 Polyneuropathy, unspecified: Secondary | ICD-10-CM | POA: Diagnosis not present

## 2023-01-19 DIAGNOSIS — Z6837 Body mass index (BMI) 37.0-37.9, adult: Secondary | ICD-10-CM | POA: Diagnosis not present

## 2023-01-19 DIAGNOSIS — M1990 Unspecified osteoarthritis, unspecified site: Secondary | ICD-10-CM | POA: Diagnosis not present

## 2023-01-19 DIAGNOSIS — J13 Pneumonia due to Streptococcus pneumoniae: Secondary | ICD-10-CM | POA: Diagnosis not present

## 2023-01-19 DIAGNOSIS — J189 Pneumonia, unspecified organism: Secondary | ICD-10-CM | POA: Diagnosis not present

## 2023-01-20 DIAGNOSIS — J441 Chronic obstructive pulmonary disease with (acute) exacerbation: Secondary | ICD-10-CM | POA: Diagnosis not present

## 2023-01-20 DIAGNOSIS — J9601 Acute respiratory failure with hypoxia: Secondary | ICD-10-CM | POA: Diagnosis not present

## 2023-01-20 DIAGNOSIS — R531 Weakness: Secondary | ICD-10-CM | POA: Diagnosis not present

## 2023-01-20 DIAGNOSIS — J189 Pneumonia, unspecified organism: Secondary | ICD-10-CM | POA: Diagnosis not present

## 2023-01-20 DIAGNOSIS — D649 Anemia, unspecified: Secondary | ICD-10-CM | POA: Diagnosis not present

## 2023-01-21 DIAGNOSIS — J13 Pneumonia due to Streptococcus pneumoniae: Secondary | ICD-10-CM | POA: Diagnosis not present

## 2023-01-21 DIAGNOSIS — G629 Polyneuropathy, unspecified: Secondary | ICD-10-CM | POA: Diagnosis not present

## 2023-01-21 DIAGNOSIS — Z6837 Body mass index (BMI) 37.0-37.9, adult: Secondary | ICD-10-CM | POA: Diagnosis not present

## 2023-01-21 DIAGNOSIS — J189 Pneumonia, unspecified organism: Secondary | ICD-10-CM | POA: Diagnosis not present

## 2023-01-21 DIAGNOSIS — M1990 Unspecified osteoarthritis, unspecified site: Secondary | ICD-10-CM | POA: Diagnosis not present

## 2023-01-25 DIAGNOSIS — Z6837 Body mass index (BMI) 37.0-37.9, adult: Secondary | ICD-10-CM | POA: Diagnosis not present

## 2023-01-25 DIAGNOSIS — J13 Pneumonia due to Streptococcus pneumoniae: Secondary | ICD-10-CM | POA: Diagnosis not present

## 2023-01-25 DIAGNOSIS — G629 Polyneuropathy, unspecified: Secondary | ICD-10-CM | POA: Diagnosis not present

## 2023-01-25 DIAGNOSIS — J189 Pneumonia, unspecified organism: Secondary | ICD-10-CM | POA: Diagnosis not present

## 2023-01-25 DIAGNOSIS — M1990 Unspecified osteoarthritis, unspecified site: Secondary | ICD-10-CM | POA: Diagnosis not present

## 2023-01-26 NOTE — Progress Notes (Unsigned)
OV feb 2010:  istory of Present Illness: OV 10/02/2007: 77 year old male known to me from mid-dec 2009 when he was admitted for Pneumococcal CAP complicated by empyema. He failed thoracentesis and therapeutic tube throcostomy. Ultimately underwent VATS on 08/16/2008. He now comes for followup to office. He feels well. Denies complaints. Off antibiotics. Went to [place] recently to spend time with grandkids. Back to work. Worked out on treadmill for 45 minutes. Only issue is that he feels numb in Rt lateral chest by incision and sometimes feels restricted on rt lower chest and he feels he has to take a deep breath. OTherwise, no complaints. Has seen Dr. Edwyna Shell for followup     Updated Prior Medication List: ALIGN  CAPS (MISC INTESTINAL FLORA REGULAT) once daily    OV 08/09/2011 77 year old male known to me in mid-dec 2009 when he was admitted for Pneumococcal CAP complicated by empyema.  He failed thoracentesis and therapeutic tube throcostomy. Ultimately underwent VATS on 08/16/2008. Last seen in office 10/01/08 and doing well and dc from fu. But now returns 08/09/11 (nearly 3 years later) reporting cough since pneumonia. Says wife insisting he see pulmonary doctor mainly becuase brother died from pulmonary fibrosis 5 years ago and mom also had a bit of pulmonary fibrosis. Dad died from emphysema. States that though cxr looked clear he says family need better satisfaction he does not have pulmonary fibrosis. Cough occurs daily -once every 1-2 hours. Severity is mild.  Wet cough in quality with mild mucus that is clear. No clear cut aggravating or relieving factors. Insidious since pneumonia and stable in course. No nocturnal awakenings. No associated dyspnea, wheezing (but states wife thinks so), hemoptysis, weight loss. Denies associated GERD but is on fish oil, ace inhibitor but reports periodic choking episodes (GI scope pending with Dr Dulce Sellar). Denies known asthma. Per hx: allergy test negative  2011 at  allergy center.  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 DEnies asbestos, birds, parrots, feathery pillows, mold exposure.    Past, Family, Social: no changes in past 3 years   REC Please have HRCT chest without contrast - rule out pulmonary fibrosis  Will call you with result  Please stop fish oil  Please finish GI workup with Dr Dulce Sellar  REturn to see me in 6 weeks   OV 09/27/2011 Folloowup cough with concern for ILD. CT chest 09/02/11 showed . No evidence of interstitial lung disease.   Pleural parenchymal scarring at the base of the right hemithorax. Scattered tiny nodular densities bilaterally. He has stopped fish oil but cough persists unchnaged. RSI cough socre is 11 - Level 1 clearing of throat, coughing aftery lying down. Level 2 - difficulty swallowing, annoying cough and sensation of something in throat. And level 3 - post nasal drip +. COugh is dry. Mild-moderate. He does not want Rx for this cough but feels wife is pushing him into this. Prefers least invasive Rx. Of note, in terms of dysphagia - has seen Dr Dulce Sellar and reportedly GI scope was normal and has been placed on empiric prilosec a few days ago. HE is not interested in an anti-GERD diet.   Past, Family, Social reviewed: no change since last visit        INPATIENT May 2024  77 year old obese never smoker was admitted via his PCP office on 5/9 where he presented with 12 days of cough.  Chest  x-ray showed right lower lobe pneumonia.  Oxygen saturation was between 89 to 91%.  When he got home and saturations dropped further, he called EMS In the ED was noted to be wheezing despite continuous albuterol neb. He was placed on oxygen and by this morning is requiring 10 to 12 L high flow nasal cannula hence PCCM consulted CT angiogram chest showed scattered groundglass opacities bilateral, patchy atelectasis or infiltrate in the right lower lobe Of  note, CT chest from 2014 shows minimal scarring right lower lobe attributed to previous empyema   Labs showed mild leukocyto   OV 01/27/2023 - hospital followup  Subjective:  Patient ID: Jeffrey [name], male , DOB: 1945/10/08 , age 77 y.o. , MRN: [id number] , AD[address]: [address] [place] [address] PCP [name]., MD Patient Care Team: [name]., MD as PCP - General (Internal Medicine) Jodelle Red, MD as PCP - Cardiology (Cardiology)  This Provider for this visit: Treatment Team:  Attending Provider: Kalman Shan, MD    01/27/2023 -   Chief Complaint  Patient presents with   Consult    Consult on pneumonia     HPI Jeffrey [name] 77 y.o. -presents for post hospital follow-up.  He has a family history of pulmonary fibrosis.  I met him in 2009 in the setting of pneumococcal pneumonia and VATS.  After that not followed up.  It appears she is readmitted for pneumococcal pneumonia 15 years later in May 2024.Marland Kitchen  He says he was coughing quite a bit before getting sick and the thought was because of allergies.  He is worried about recurrence but at the same time he told me Pietro Cassis I offered him] that he does not want to see immunologist [facility].  He is worried about too much testing.  He prefers more conservative line of approach.  Today states he feels well.  Is been try to wean the oxygen off by self using subjective symptoms and also his home pulse ox.  Today when we walked him he did not desaturate.  Have given him the cleared to return his home oxygen.  I did visualize the CT scan from the hospital and there was some infiltrates.  It appears he had out of proportion hypoxemia.  In any event his hypoxemia is resolved.  At this point in time he is willing to have follow-up CT scan of the chest in few to several months.  We decided that we would pursue investigative workup for potential recurrence at the time of follow-up.  He was okay with that.  Did  discuss the fact he could have had 2 different strains of the pneumococcus over the years despite adequate pneumococcal vaccination.  He has an intact spleen.  Other alternative is some kind of a complement or some kind of immune deficiency that makes him susceptible to pneumococcus.  Given general advice about preventing respiratory illness.  Currently he feels baseline.  He uses a cane because of his neuropathy and obesity.  He says that his main limitation.  In fact walking him today that was a limitation.  He can drive.  He can do groceries with some assist.   EOS  Latest Reference Range & Units 08/11/08 02:18 01/07/23 17:00 01/08/23 02:54 01/09/23 02:57  Eosinophils Absolute 0.0 - 0.5 K/uL 0.0 0.1 0.0 0.0   Simple office walk 224 (66+46 x 2) feet Pod A at [facility] x  3 laps goal with forehead probe 01/27/2023  O2 used ra   Number laps completed 2 pf 3 due to usnteady   Comments about pace Avg pace with cane   Resting Pulse Ox/HR 95% and 82/min   Final Pulse Ox/HR 96% and 110/min   Desaturated </= 88% no   Desaturated <= 3% points no   Got Tachycardic >/= 90/min yes   Symptoms at end of test Mild dyspna   Miscellaneous comments Nuerophahty in feet made him stop      PFT     Latest Ref Rng & Units 09/03/2015   11:37 AM  PFT Results  FVC-Pre L 3.56   FVC-Predicted Pre % 75   Pre FEV1/FVC % % 78   FEV1-Pre L 2.76   FEV1-Predicted Pre % 79   DLCO uncorrected ml/min/mmHg 25.44   DLCO UNC% % 75   DLVA Predicted % 98   TLC L 6.24   TLC % Predicted % 86   RV % Predicted % 102      Latest Reference Range & Units 08/11/08 02:18 08/12/08 05:09 08/13/08 05:25 08/14/08 04:53 08/15/08 03:50 08/15/08 19:33 08/16/08 04:00 08/17/08 04:00 08/18/08 04:25 08/19/08 04:00 08/20/08 04:30 08/21/08 04:30 01/07/23 17:00 01/08/23 02:54 01/09/23 02:57 01/10/23 02:56 01/11/23 04:21 01/14/23 03:54  BUN 8 - 23 mg/dL 41 (H) 35 (H) 29 (H) 24 (H) 19 17 18 16 18 18 16 15 18 19 27  (H) 29 (H) 26 (H) 34 (H)   Creatinine 0.61 - 1.24 mg/dL 6.04 (H) 5.40 (H) 9.81 1.23 1.03 0.97 1.03 0.95 1.09 0.90 0.87 0.79 1.18 1.19 1.20 1.14 1.03 1.09  (H): Data is abnormally high    Latest Reference Range & Units 08/11/08 02:18 08/12/08 05:09 08/13/08 05:25 08/14/08 04:53 08/15/08 03:50 08/15/08 19:33 08/16/08 04:00 08/17/08 04:00 08/18/08 04:25 08/19/08 04:00 08/20/08 04:30 08/21/08 04:30 08/22/08 05:15 11/27/15 14:00 12/04/19 08:54 01/07/23 17:00 01/08/23 02:54 01/09/23 02:57 01/10/23 02:56 01/11/23 04:21 01/14/23 03:54  Hemoglobin 13.0 - 17.0 g/dL 19.1 (L) 47.8 (L) 29.5 (L) 11.4 (L) 10.9 (L) 9.1 (L) 9.5 (L) 9.2 (L) 9.8 (L) 9.5 (L) 10.1 (L) 10.2 (L) 9.9 (L) 13.2 13.6 11.9 (L) 11.1 (L) 11.4 (L) 11.3 (L) 11.3 (L) 12.1 (L)  (L): Data is abnormally low   Latest Reference Range & Units 12/21/21 11:05  Total Protein 6.0 - 8.5 g/dL 6.6  Copper 69 - 621 ug/dL 308  Vitamin M57 846 - 1,245 pg/mL 738  Albumin SerPl Elph-Mcnc 2.9 - 4.4 g/dL 3.8  Albumin/Glob SerPl 0.7 - 1.7  1.4  Alpha2 Glob SerPl Elph-Mcnc 0.4 - 1.0 g/dL 0.7  Alpha 1 0.0 - 0.4 g/dL 0.2  Gamma Glob SerPl Elph-Mcnc 0.4 - 1.8 g/dL 1.1  M Protein SerPl Elph-Mcnc Not Observed g/dL Not Observed  IFE 1  Comment  Globulin, Total 2.2 - 3.9 g/dL 2.8  B-Globulin SerPl Elph-Mcnc 0.7 - 1.3 g/dL 0.9  IgG (Immunoglobin G), Serum 603 - 1,613 mg/dL 9,629  IgM (Immunoglobulin M), Srm 15 - 143 mg/dL 24  Please Note (HCV):  Comment  ENA SSA (RO) Ab 0.0 - 0.9 AI 0.3  ENA SSB (LA) Ab 0.0 - 0.9 AI <0.2  RA Latex Turbid. <14.0 IU/mL <10.0  IgA/Immunoglobulin A, Serum 61 - 437 mg/dL 528     has a past medical history of Arthritis, Difficulty sleeping, EMPYEMA (10/01/2008), Pneumonia, and RCT (rotator cuff tear).   reports that he has never smoked. He has never used smokeless tobacco.  Past Surgical History:  Procedure Laterality Date   BALLOON DILATION  09/01/2011  Procedure: BALLOON DILATION;  Surgeon: [name], MD;  Location: [facility];  Service:  Endoscopy;  Laterality: N/A;   ESOPHAGOGASTRODUODENOSCOPY  09/01/2011   Procedure: ESOPHAGOGASTRODUODENOSCOPY (EGD);  Surgeon: [name], MD;  Location: [facility];  Service: Endoscopy;  Laterality: N/A;   lumbar disc surgery-2007     LUNG SURGERY     REMOVAL OF EMPYEMA   post neck surgery  1966   post neck surgeryx2     SHOULDER OPEN ROTATOR CUFF REPAIR Left 12/03/2015   Procedure: LEFT SHOULDER MINI OPEN ROTATOR CUFF REPAIR  ;  Surgeon: Jene Every, MD;  Location: [facility];  Service: Orthopedics;  Laterality: Left;   thoracic sug. due to pneumonia  dec. 2010   TRANSFORAMINAL LUMBAR INTERBODY FUSION (TLIF) WITH PEDICLE SCREW FIXATION 1 LEVEL N/A 12/07/2019   Procedure: Lumbar Four-Five Transforaminal lumbar interbody fusion;  Surgeon: Maeola Harman, MD;  Location: [facility];  Service: Neurosurgery;  Laterality: N/A;  Lumbar Four-Five Transforaminal lumbar interbody fusion    No Known Allergies  Immunization History  Administered Date(s) Administered   Influenza Split 07/14/2009, 07/21/2010, 07/26/2011, 08/03/2012, 08/09/2013, 05/29/2014   Influenza, Quadrivalent, Recombinant, Inj, Pf 06/04/2020   Influenza,inj,Quad PF,6+ Mos 06/28/2015   Influenza-Unspecified 07/05/2018   PFIZER(Purple Top)SARS-COV-2 Vaccination 10/04/2019, 10/29/2019   Pneumococcal Conjugate-13 09/25/2013   Pneumococcal Polysaccharide-23 10/01/2007, 09/10/2008, 09/03/2016, 12/03/2022   Td (Adult),5 Lf Tetanus Toxid, Preservative Free 07/10/2008, 08/03/2012   Tdap 08/03/2012   Zoster, Live 07/10/2008, 05/31/2019    Family History  Problem Relation Age of Onset   Cancer Mother    Pulmonary fibrosis Mother    Emphysema Father    Pulmonary fibrosis Brother    Lung disease Other      Current Outpatient Medications:    arformoterol (BROVANA) 15 MCG/2ML NEBU, Take 2 mLs (15 mcg total) by nebulization 2 (two) times daily., Disp: 120 mL, Rfl: 0   B Complex-C (B-COMPLEX WITH VITAMIN C) tablet, Take 1 tablet by mouth  in the morning and at bedtime., Disp: , Rfl:    Cholecalciferol (VITAMIN D3 PO), Take 1 tablet by mouth daily., Disp: , Rfl:    gabapentin (NEURONTIN) 300 MG capsule, One po qAM, one po qPM and to po qHS (Patient taking differently: Take 300-600 mg by mouth See admin instructions. Take 300 mg by mouth in the morning & evening and 600 mg at bedtime), Disp: 360 capsule, Rfl: 4   Multiple Vitamin (MULTIVITAMIN WITH MINERALS) TABS tablet, Take 1 tablet by mouth daily. , Disp: , Rfl:    polycarbophil (FIBERCON) 625 MG tablet, Take 625 mg by mouth in the morning and at bedtime. , Disp: , Rfl:    Probiotic Product (PROBIOTIC PO), Take 1 tablet by mouth daily., Disp: , Rfl:    revefenacin (YUPELRI) 175 MCG/3ML nebulizer solution, Take 3 mLs (175 mcg total) by nebulization daily., Disp: 90 mL, Rfl: 0   TRELEGY ELLIPTA 100-62.5-25 MCG/ACT AEPB, Inhale 1 puff into the lungs daily., Disp: , Rfl:    albuterol (VENTOLIN HFA) 108 (90 Base) MCG/ACT inhaler, Inhale 2 puffs into the lungs every 6 (six) hours as needed for wheezing or shortness of breath. (Patient not taking: Reported on 01/27/2023), Disp: , Rfl:       Objective:   Vitals:   01/27/23 1058  BP: (!) 140/90  Pulse: 84  SpO2: 92%  Weight: 268 lb 12.8 oz (121.9 kg)  Height: 5\' 11"  (1.803 m)    Estimated body mass index is 37.49 kg/m as calculated from the  following:   Height as of this encounter: 5\' 11"  (1.803 m).   Weight as of this encounter: 268 lb 12.8 oz (121.9 kg).  @WEIGHTCHANGE @  [employer]   01/27/23 1058  Weight: 268 lb 12.8 oz (121.9 kg)     Physical Exam   General: No distress. Looks well O2 at rest: no Cane present: YE Sitting in wheel chair: no Frail: no Obese: YES Neuro: Alert and Oriented x 3. GCS 15. Speech normal Psych: Pleasant Resp:  Barrel Chest - no.  Wheeze - no, Crackles - RLL, No overt respiratory distress CVS: Normal heart sounds. Murmurs - non Ext: Stigmata of Connective Tissue Disease -  no HEENT: Normal upper airway. PEERL +. No post nasal drip        Assessment:       ICD-10-CM   1. History of acute respiratory failure  Z87.09     2. History of pneumococcal pneumonia  Z87.01     3. Lung crackles  R09.89          Plan:     Patient Instructions     ICD-10-CM   1. History of acute respiratory failure  Z87.09     2. History of pneumococcal pneumonia  Z87.01     3. Lung crackles  R09.89      Significantly rapidly improved from pneumonia admission May 2024. The second pneumococcal pneumonia episode in 15 years. There is family history of pulmonary fibrosis  Hypoxemia from the hospital has resolved during testing 01/27/2023  Plan - CMA to send an order to DME company to pick up your oxygen system -Return to baseline normal activities as before -Can stop using nebulizers or inhalers and monitor clinically -Stay away from crowded places and also use mask especially in human cluster situation -Do follow-up high-resolution CT scan of the chest supine and prone in 4-6 months  Follow-up - 4-51-month 15-minute visit after CT scan of the chest.    - consider autoimmune, complement and rast allergy panel workup in afternoon    SIGNATURE    Dr. Kalman Shan, M.D., F.C.C.P,  Pulmonary and Critical Care Medicine Staff Physician, [facility] Director - Interstitial Lung Disease  Program  Pulmonary Fibrosis [facility] at [facility] [place], [place], [address]  Pager: [phone], If no answer or between  15:00h - 7:00h: call [phone] Telephone: [phone]  11:45 AM 01/27/2023

## 2023-01-27 ENCOUNTER — Ambulatory Visit (INDEPENDENT_AMBULATORY_CARE_PROVIDER_SITE_OTHER): Payer: Medicare Other | Admitting: Internal Medicine

## 2023-01-27 ENCOUNTER — Encounter: Payer: Self-pay | Admitting: Internal Medicine

## 2023-01-27 VITALS — BP 140/90 | HR 84 | Ht 71.0 in | Wt 268.8 lb

## 2023-01-27 DIAGNOSIS — Z8709 Personal history of other diseases of the respiratory system: Secondary | ICD-10-CM | POA: Diagnosis not present

## 2023-01-27 DIAGNOSIS — R0989 Other specified symptoms and signs involving the circulatory and respiratory systems: Secondary | ICD-10-CM | POA: Diagnosis not present

## 2023-01-27 DIAGNOSIS — Z8701 Personal history of pneumonia (recurrent): Secondary | ICD-10-CM

## 2023-01-27 DIAGNOSIS — R0602 Shortness of breath: Secondary | ICD-10-CM | POA: Diagnosis not present

## 2023-01-27 NOTE — Patient Instructions (Addendum)
ICD-10-CM   1. History of acute respiratory failure  Z87.09     2. History of pneumococcal pneumonia  Z87.01     3. Lung crackles  R09.89      Significantly rapidly improved from pneumonia admission May 2024. The second pneumococcal pneumonia episode in 15 years. There is family history of pulmonary fibrosis  Hypoxemia from the hospital has resolved during testing 01/27/2023  Plan - CMA to send an order to DME company to pick up your oxygen system -Return to baseline normal activities as before -Can stop using nebulizers or inhalers and monitor clinically -Stay away from crowded places and also use mask especially in human cluster situation -Do follow-up high-resolution CT scan of the chest supine and prone in 4-6 months  Follow-up - 4-106-month 15-minute visit after CT scan of the chest.    - consider autoimmune, complement and rast allergy panel workup in afternoon

## 2023-01-28 DIAGNOSIS — M1990 Unspecified osteoarthritis, unspecified site: Secondary | ICD-10-CM | POA: Diagnosis not present

## 2023-01-28 DIAGNOSIS — Z6837 Body mass index (BMI) 37.0-37.9, adult: Secondary | ICD-10-CM | POA: Diagnosis not present

## 2023-01-28 DIAGNOSIS — J189 Pneumonia, unspecified organism: Secondary | ICD-10-CM | POA: Diagnosis not present

## 2023-01-28 DIAGNOSIS — J13 Pneumonia due to Streptococcus pneumoniae: Secondary | ICD-10-CM | POA: Diagnosis not present

## 2023-01-28 DIAGNOSIS — G629 Polyneuropathy, unspecified: Secondary | ICD-10-CM | POA: Diagnosis not present

## 2023-02-28 DIAGNOSIS — D649 Anemia, unspecified: Secondary | ICD-10-CM | POA: Diagnosis not present

## 2023-02-28 DIAGNOSIS — J189 Pneumonia, unspecified organism: Secondary | ICD-10-CM | POA: Diagnosis not present

## 2023-02-28 DIAGNOSIS — J441 Chronic obstructive pulmonary disease with (acute) exacerbation: Secondary | ICD-10-CM | POA: Diagnosis not present

## 2023-02-28 DIAGNOSIS — J9601 Acute respiratory failure with hypoxia: Secondary | ICD-10-CM | POA: Diagnosis not present

## 2023-02-28 DIAGNOSIS — R531 Weakness: Secondary | ICD-10-CM | POA: Diagnosis not present

## 2023-02-28 DIAGNOSIS — G629 Polyneuropathy, unspecified: Secondary | ICD-10-CM | POA: Diagnosis not present

## 2023-04-25 DIAGNOSIS — M2041 Other hammer toe(s) (acquired), right foot: Secondary | ICD-10-CM | POA: Diagnosis not present

## 2023-04-25 DIAGNOSIS — B351 Tinea unguium: Secondary | ICD-10-CM | POA: Diagnosis not present

## 2023-04-25 DIAGNOSIS — L603 Nail dystrophy: Secondary | ICD-10-CM | POA: Diagnosis not present

## 2023-04-25 DIAGNOSIS — M2042 Other hammer toe(s) (acquired), left foot: Secondary | ICD-10-CM | POA: Diagnosis not present

## 2023-04-25 DIAGNOSIS — I70203 Unspecified atherosclerosis of native arteries of extremities, bilateral legs: Secondary | ICD-10-CM | POA: Diagnosis not present

## 2023-04-27 ENCOUNTER — Encounter: Payer: Self-pay | Admitting: Neurology

## 2023-04-27 ENCOUNTER — Ambulatory Visit: Payer: Medicare Other | Admitting: Neurology

## 2023-04-27 VITALS — BP 142/70 | HR 76 | Ht 71.0 in | Wt 275.5 lb

## 2023-04-27 DIAGNOSIS — Z981 Arthrodesis status: Secondary | ICD-10-CM

## 2023-04-27 DIAGNOSIS — G629 Polyneuropathy, unspecified: Secondary | ICD-10-CM

## 2023-04-27 DIAGNOSIS — R200 Anesthesia of skin: Secondary | ICD-10-CM

## 2023-04-27 DIAGNOSIS — R269 Unspecified abnormalities of gait and mobility: Secondary | ICD-10-CM

## 2023-04-27 MED ORDER — GABAPENTIN 300 MG PO CAPS: ORAL_CAPSULE | ORAL | 11 refills | Status: DC

## 2023-04-27 NOTE — Addendum Note (Signed)
Addended by: Asa Lente on: 04/27/2023 07:47 PM   Modules accepted: Level of Service

## 2023-04-27 NOTE — Progress Notes (Addendum)
[facility]  PATIENT: Jeffrey [name] DOB: 08/13/1946  REFERRING DOCTOR OR PCP: Martha Clan, MD  SOURCE: Patient, notes from primary care, imaging and laboratory reports, MRI images personally reviewed.  _________________________________   HISTORICAL  CHIEF COMPLAINT:  Chief Complaint  Patient presents with   Follow-up    Pt in room 11. Here for Polyneuropathy follow up. Pt said burning, tingling, numbness in  feet and walks with cane.  Takes gabapentin helps with burning in feet.    HISTORY OF PRESENT ILLNESS:  Jeffrey [name] is a 77 y.o. man with neuropathy.  UPDATE 04/27/2023 He reports a little more numbness in his feet   He has pain in the bottom of the feet and altered sensation in his right leg > left leg up to mid shin.   Pain is sharp.    It fluctuates but is always present.   Pain does worse when he walks.  Due to his LB issues he does not walk far in general.  He is on gabapentin 300-300-600 and he tolerates it well.   The pain is better.  Etiology of his polyneuropathy is uncertain.  EMG had shown primarily an axonal pattern.  Blood work for treatable causes of neuropathy was negative.  He denies weakness.  No bladder issues.  He reports his brother was also found to have polyneuropathy recently and was referred to a neuromuscular specialist at [facility].  He is otherwise healthy.  Recent hemoglobin A1c was 5.3.  Polyneuropathy history and data: He has had pain and numbness in the feet since around 2005.  Symptoms are usually right > left.    Initially he was diagnosed with plantar fasciitis and then polyneuropathy.   He had lumbar surgery 12/07/2019 by Dr. Venetia Maxon (L4-L5 and L5S2 decompression, with instrumentation and posterior fusion.  Pain in back and legs improved afterwards.  He still has some pain while standing but none while sitting.      NCV/EMG 12/2021:   Electrodiagnostic evidence of a predominantly axonal motor peripheral neuropathy, involving the  bilateral common peroneal and tibial motor nerves, with secondary demyelinating features noted.    No electrodiagnostic evidence of a peroneal motor entrapment neuropathy at the level of bilateral fibular heads.   No electrodiagnostic evidence of a sural sensory peripheral neuropathy in the bilateral lower extremities.   Labs: 12/21/2021: SPEP, B12, SSA/SSB, rheumatoid factor and copper were normal or negative  Imaging: MRI of the lumbar spine 10/09/2019 shows mild facet hypertrophy at L3-L4 that does not lead to spinal stenosis or nerve root compression.  At L4-L5, there is trace anterolisthesis and right disc herniation causing moderately severe right foraminal and lateral recess stenosis with potential for right L4 and L5 nerve root compression.  There is also moderate left foraminal and lateral recess stenosis though there does not appear to be nerve root compression on that side.  At L5-S1, there is l bilateral facet hypertrophy causing mild foraminal and lateral recess stenosis but no nerve root compression.  REVIEW OF SYSTEMS: Constitutional: No fevers, chills, sweats, or change in appetite Eyes: No visual changes, double vision, eye pain Ear, nose and throat: No hearing loss, ear pain, nasal congestion, sore throat Cardiovascular: No chest pain, palpitations Respiratory:  No shortness of breath at rest or with exertion.   No wheezes GastrointestinaI: No nausea, vomiting, diarrhea, abdominal pain, fecal incontinence Genitourinary:  No dysuria, urinary retention or frequency.  No nocturia. Musculoskeletal:  No neck pain, back pain Integumentary: No rash, pruritus, skin  lesions Neurological: as above Psychiatric: No depression at this time.  No anxiety Endocrine: No palpitations, diaphoresis, change in appetite, change in weigh or increased thirst Hematologic/Lymphatic:  No anemia, purpura, petechiae. Allergic/Immunologic: No itchy/runny eyes, nasal congestion, recent allergic reactions,  rashes  ALLERGIES: No Known Allergies  HOME MEDICATIONS:  Current Outpatient Medications:    B Complex-C (B-COMPLEX WITH VITAMIN C) tablet, Take 1 tablet by mouth in the morning and at bedtime., Disp: , Rfl:    Cholecalciferol (VITAMIN D3 PO), Take 1 tablet by mouth daily., Disp: , Rfl:    Multiple Vitamin (MULTIVITAMIN WITH MINERALS) TABS tablet, Take 1 tablet by mouth daily. , Disp: , Rfl:    polycarbophil (FIBERCON) 625 MG tablet, Take 625 mg by mouth in the morning and at bedtime. , Disp: , Rfl:    Probiotic Product (PROBIOTIC PO), Take 1 tablet by mouth daily., Disp: , Rfl:    revefenacin (YUPELRI) 175 MCG/3ML nebulizer solution, Take 3 mLs (175 mcg total) by nebulization daily., Disp: 90 mL, Rfl: 0   TRELEGY ELLIPTA 100-62.5-25 MCG/ACT AEPB, Inhale 1 puff into the lungs daily., Disp: , Rfl:    albuterol (VENTOLIN HFA) 108 (90 Base) MCG/ACT inhaler, Inhale 2 puffs into the lungs every 6 (six) hours as needed for wheezing or shortness of breath. (Patient not taking: Reported on 01/27/2023), Disp: , Rfl:    arformoterol (BROVANA) 15 MCG/2ML NEBU, Take 2 mLs (15 mcg total) by nebulization 2 (two) times daily. (Patient not taking: Reported on 04/27/2023), Disp: 120 mL, Rfl: 0   gabapentin (NEURONTIN) 300 MG capsule, Take 300 mg by mouth in the morning & evening and 600 mg at bedtime, Disp: 120 capsule, Rfl: 11  PAST MEDICAL HISTORY: Past Medical History:  Diagnosis Date   Arthritis    Difficulty sleeping    EMPYEMA 10/01/2008   Pneumonia    RCT (rotator cuff tear)    LEFT    PAST SURGICAL HISTORY: Past Surgical History:  Procedure Laterality Date   BALLOON DILATION  09/01/2011   Procedure: BALLOON DILATION;  Surgeon: [name], MD;  Location: [facility];  Service: Endoscopy;  Laterality: N/A;   ESOPHAGOGASTRODUODENOSCOPY  09/01/2011   Procedure: ESOPHAGOGASTRODUODENOSCOPY (EGD);  Surgeon: [name], MD;  Location: [facility];  Service: Endoscopy;  Laterality: N/A;    lumbar disc surgery-2007     LUNG SURGERY     REMOVAL OF EMPYEMA   post neck surgery  1966   post neck surgeryx2     SHOULDER OPEN ROTATOR CUFF REPAIR Left 12/03/2015   Procedure: LEFT SHOULDER MINI OPEN ROTATOR CUFF REPAIR  ;  Surgeon: Jene Every, MD;  Location: [facility];  Service: Orthopedics;  Laterality: Left;   thoracic sug. due to pneumonia  dec. 2010   TRANSFORAMINAL LUMBAR INTERBODY FUSION (TLIF) WITH PEDICLE SCREW FIXATION 1 LEVEL N/A 12/07/2019   Procedure: Lumbar Four-Five Transforaminal lumbar interbody fusion;  Surgeon: Maeola Harman, MD;  Location[facility];  Service: Neurosurgery;  Laterality: N/A;  Lumbar Four-Five Transforaminal lumbar interbody fusion    FAMILY HISTORY: Family History  Problem Relation Age of Onset   Cancer Mother    Pulmonary fibrosis Mother    Emphysema Father    Pulmonary fibrosis Brother    Lung disease Other     SOCIAL HISTORY:  Social History   Socioeconomic History   Marital status: Married    Spouse name: nancy   Number of children: 2   Years of education: Not on file   Highest education  level: Master's degree (e.g., MA, MS, MEng, MEd, MSW, MBA)  Occupational History   Occupation: teaches in driving school  Tobacco Use   Smoking status: Never   Smokeless tobacco: Never  Vaping Use   Vaping status: Never Used  Substance and Sexual Activity   Alcohol use: Not on file   Drug use: No   Sexual activity: Yes  Other Topics Concern   Not on file  Social History Narrative   Lives at home w wife   R handed   Caffeine: 2 pepsi cans a day   Social Determinants of Health   Financial Resource Strain: Not on file  Food Insecurity: No Food Insecurity (01/10/2023)   Hunger Vital Sign    Worried About Running Out of Food in the Last Year: Never true    Ran Out of Food in the Last Year: Never true  Transportation Needs: No Transportation Needs (01/10/2023)   PRAPARE - Administrator, Civil Service (Medical): No    Lack of  Transportation (Non-Medical): No  Physical Activity: Not on file  Stress: Not on file  Social Connections: Not on file  Intimate Partner Violence: Not At Risk (01/10/2023)   Humiliation, Afraid, Rape, and Kick questionnaire    Fear of Current or Ex-Partner: No    Emotionally Abused: No    Physically Abused: No    Sexually Abused: No     PHYSICAL EXAM  Vitals:   04/27/23 1039 04/27/23 1045  BP: (!) 150/87 (!) 142/70  Pulse: 81 76  Weight: 275 lb 8 oz (125 kg)   Height: 5\' 11"  (1.803 m)     Body mass index is 38.42 kg/m.   General: The patient is well-developed and well-nourished and in no acute distress  HEENT:  Head is [place]/AT.  Sclera are anicteric.   Skin: Extremities are without rash or  edema.  Musculoskeletal:  Back is nontender  Neurologic Exam  Mental status: The patient is alert and oriented x 3 at the time of the examination. The patient has apparent normal recent and remote memory, with an apparently normal attention span and concentration ability.   Speech is normal.  Cranial nerves: Extraocular movements are full.  Facial strength and sensation was normal.  No dysarthria.. No obvious hearing deficits are noted.  Motor:  Muscle bulk is normal.   Tone is normal. Strength is  5 / 5 in all 4 extremities except 4+/5 EHL muscles bilat. .   Sensory: Sensory testing is intact to pinprick, soft touch and vibration sensation in arms but reduced vibration sensation at ankles and absent at toes.   Reduced pinprick at toes only and pinprick sensation was normal by mid-foot.   .  Coordination: Cerebellar testing reveals good finger-nose-finger and heel-to-shin bilaterally.  Gait and station: Station is normal.   Gait is mildly shuffling and he cannot do a tandem walk.  . Romberg is negative.   Reflexes: Deep tendon reflexes are symmetric and normal in arms and knees and trace at ankles.Marland Kitchen       DIAGNOSTIC DATA (LABS, IMAGING, TESTING) - I reviewed patient records,  labs, notes, testing and imaging myself where available.  Lab Results  Component Value Date   WBC 10.9 (H) 01/14/2023   HGB 12.1 (L) 01/14/2023   HCT 36.4 (L) 01/14/2023   MCV 96.3 01/14/2023   PLT 295 01/14/2023      Component Value Date/Time   NA 138 01/14/2023 0354   K 4.8 01/14/2023 0354  CL 103 01/14/2023 0354   CO2 29 01/14/2023 0354   GLUCOSE 144 (H) 01/14/2023 0354   BUN 34 (H) 01/14/2023 0354   CREATININE 1.09 01/14/2023 0354   CALCIUM 8.6 (L) 01/14/2023 0354   PROT 7.1 01/08/2023 0254   PROT 6.6 12/21/2021 1105   ALBUMIN 3.8 01/08/2023 0254   AST 30 01/08/2023 0254   ALT 28 01/08/2023 0254   ALKPHOS 31 (L) 01/08/2023 0254   BILITOT 0.6 01/08/2023 0254   GFRNONAA >60 01/14/2023 0354   GFRAA  08/21/2008 0430    >60        The eGFR has been calculated using the MDRD equation. This calculation has not been validated in all clinical   No results found for: "CHOL", "HDL", "LDLCALC", "LDLDIRECT", "TRIG", "CHOLHDL" Lab Results  Component Value Date   HGBA1C  08/12/2008    5.5 (NOTE)   The ADA recommends the following therapeutic goal for glycemic   control related to Hgb A1C measurement:   Goal of Therapy:   < 7.0% Hgb A1C   Reference: American Diabetes Association: Clinical Practice   Recommendations 2008, Diabetes Care,  2008, 31:(Suppl 1).   Lab Results  Component Value Date   VITAMINB12 738 12/21/2021   No results found for: "TSH"     ASSESSMENT AND PLAN  Polyneuropathy - Plan: Ambulatory referral to Neurology  Gait disturbance - Plan: Ambulatory referral to Neurology  Numbness  History of fusion of lumbar spine  He has a length dependent polyneuropathy.  The exam is more consistent with large fiber polyneuropathy  Continue gabapentin to 300 mg in the morning, 300 mg in the afternoon and 600 mg at night.  If he tolerates the dose, this can be increased further. Stay active and exercise as tolerated. We discussed safety especially in the dark or  when his eyes are closed (as in shower) Return in 12 months or sooner if there are new or worsening neurologic symptoms.  This visit is part of a comprehensive longitudinal care medical relationship regarding the patients primary diagnosis of polyneuropathy and related concerns.   Audryana Hockenberry A. Epimenio Foot, MD, [facility] 04/27/2023, 7:42 PM Certified in Neurology, Clinical Neurophysiology, Sleep Medicine and Neuroimaging  [facility] [address] [place], [place] [address] [phone]

## 2023-05-03 ENCOUNTER — Telehealth: Payer: Self-pay | Admitting: Neurology

## 2023-05-03 NOTE — Telephone Encounter (Signed)
Referral for neurology fax to [facility]. Phone: [phone]. Fax: [phone].

## 2023-05-05 DIAGNOSIS — Z961 Presence of intraocular lens: Secondary | ICD-10-CM | POA: Diagnosis not present

## 2023-05-05 DIAGNOSIS — H179 Unspecified corneal scar and opacity: Secondary | ICD-10-CM | POA: Diagnosis not present

## 2023-05-05 DIAGNOSIS — H04123 Dry eye syndrome of bilateral lacrimal glands: Secondary | ICD-10-CM | POA: Diagnosis not present

## 2023-05-09 DIAGNOSIS — L603 Nail dystrophy: Secondary | ICD-10-CM | POA: Diagnosis not present

## 2023-05-23 DIAGNOSIS — B351 Tinea unguium: Secondary | ICD-10-CM | POA: Diagnosis not present

## 2023-05-23 DIAGNOSIS — G629 Polyneuropathy, unspecified: Secondary | ICD-10-CM | POA: Diagnosis not present

## 2023-05-23 DIAGNOSIS — M2042 Other hammer toe(s) (acquired), left foot: Secondary | ICD-10-CM | POA: Diagnosis not present

## 2023-05-23 DIAGNOSIS — I70203 Unspecified atherosclerosis of native arteries of extremities, bilateral legs: Secondary | ICD-10-CM | POA: Diagnosis not present

## 2023-05-23 DIAGNOSIS — L603 Nail dystrophy: Secondary | ICD-10-CM | POA: Diagnosis not present

## 2023-05-23 DIAGNOSIS — M2041 Other hammer toe(s) (acquired), right foot: Secondary | ICD-10-CM | POA: Diagnosis not present

## 2023-06-28 ENCOUNTER — Ambulatory Visit
Admission: RE | Admit: 2023-06-28 | Discharge: 2023-06-28 | Disposition: A | Discharge status: Home / Self Care | Payer: Medicare Other | Source: Ambulatory Visit | Attending: Internal Medicine | Admitting: Internal Medicine

## 2023-06-28 DIAGNOSIS — Z8701 Personal history of pneumonia (recurrent): Secondary | ICD-10-CM

## 2023-06-28 DIAGNOSIS — R0602 Shortness of breath: Secondary | ICD-10-CM

## 2023-06-28 DIAGNOSIS — Z8709 Personal history of other diseases of the respiratory system: Secondary | ICD-10-CM

## 2023-07-01 DIAGNOSIS — R7302 Impaired glucose tolerance (oral): Secondary | ICD-10-CM | POA: Diagnosis not present

## 2023-07-01 DIAGNOSIS — G629 Polyneuropathy, unspecified: Secondary | ICD-10-CM | POA: Diagnosis not present

## 2023-07-01 DIAGNOSIS — Z6838 Body mass index (BMI) 38.0-38.9, adult: Secondary | ICD-10-CM | POA: Diagnosis not present

## 2023-07-01 DIAGNOSIS — R269 Unspecified abnormalities of gait and mobility: Secondary | ICD-10-CM | POA: Diagnosis not present

## 2023-07-01 DIAGNOSIS — R2689 Other abnormalities of gait and mobility: Secondary | ICD-10-CM | POA: Diagnosis not present

## 2023-07-01 DIAGNOSIS — E66812 Obesity, class 2: Secondary | ICD-10-CM | POA: Diagnosis not present

## 2023-08-03 DIAGNOSIS — Z23 Encounter for immunization: Secondary | ICD-10-CM | POA: Diagnosis not present

## 2023-08-05 ENCOUNTER — Other Ambulatory Visit: Payer: Self-pay | Admitting: Neurology

## 2023-08-08 NOTE — Telephone Encounter (Signed)
Last seen on 04/27/23 Follow up scheduled on 04/26/24

## 2023-08-10 DIAGNOSIS — R531 Weakness: Secondary | ICD-10-CM | POA: Diagnosis not present

## 2023-08-10 DIAGNOSIS — R202 Paresthesia of skin: Secondary | ICD-10-CM | POA: Diagnosis not present

## 2023-08-10 DIAGNOSIS — R269 Unspecified abnormalities of gait and mobility: Secondary | ICD-10-CM | POA: Diagnosis not present

## 2023-08-29 DIAGNOSIS — G629 Polyneuropathy, unspecified: Secondary | ICD-10-CM | POA: Diagnosis not present

## 2023-08-29 DIAGNOSIS — M2042 Other hammer toe(s) (acquired), left foot: Secondary | ICD-10-CM | POA: Diagnosis not present

## 2023-08-29 DIAGNOSIS — M2041 Other hammer toe(s) (acquired), right foot: Secondary | ICD-10-CM | POA: Diagnosis not present

## 2023-08-29 DIAGNOSIS — L603 Nail dystrophy: Secondary | ICD-10-CM | POA: Diagnosis not present

## 2023-09-14 ENCOUNTER — Telehealth: Payer: Self-pay | Admitting: Internal Medicine

## 2023-09-14 NOTE — Telephone Encounter (Signed)
 Patient is here in office to get results of CT scan. Please call with results

## 2023-09-21 NOTE — Telephone Encounter (Signed)
I called and spoke with the pt. Pt states he never received a call regarding his CT results on 06-28-23. Dr Marchelle Gearing ordered this. Please advise this for pt.

## 2023-09-22 NOTE — Telephone Encounter (Signed)
  I saw him in May 2024.  After that he was supposed to come and follow-up with me after the CT scan.  I do not know if the office did not schedule him or what the issue was.  I apologize for that but he was seen as part of hospital follow-up.  He also has a brother with pulmonary fibrosis.   Plan -Please apologize for the delay but  Please ensure that he has MyChart access so we can visualize these results given our backlog -Please ask him if an appointment was made for him or not -Let him know good news no pulmonary fibrosis no pneumonia no cancer -There are some 3 mm lung nodules are old and stable and does not require follow-up -He does have coronary artery calcification along with aortic valve calcification -> he should discuss this with his cardiology team -Please give them 15-minute first available follow-up if needed    XAM: CT CHEST WITHOUT CONTRAST   TECHNIQUE: Multidetector CT imaging of the chest was performed following the standard protocol without intravenous contrast. High resolution imaging of the lungs, as well as inspiratory and expiratory imaging, was performed.   RADIATION DOSE REDUCTION: This exam was performed according to the departmental dose-optimization program which includes automated exposure control, adjustment of the mA and/or kV according to patient size and/or use of iterative reconstruction technique.   COMPARISON:  01/07/2023.   FINDINGS: Cardiovascular: Atherosclerotic calcification of the aorta, aortic valve and coronary arteries. Heart is at the upper limits of normal in size to mildly enlarged. No pericardial effusion. Ascending aorta measures up to 3.7 cm (7/85)   Mediastinum/Nodes: No pathologically enlarged mediastinal or axillary lymph nodes. Hilar regions are difficult to definitively evaluate without IV contrast. Esophagus is grossly unremarkable.   Lungs/Pleura: Image quality is degraded by respiratory motion, especially in the lung  bases. Pleuroparenchymal scarring in the right hemithorax. Negative for subpleural reticulation, traction bronchiectasis/bronchiolectasis, ground glass, architectural distortion or honeycombing. Mild pulmonary parenchymal mosaic attenuation with air trapping. A few scattered tiny pulmonary nodules measure 3 mm or less in size, unchanged. No specific follow-up necessary. No pleural fluid. Airway is unremarkable.   Upper Abdomen: Probable small cyst in the right hepatic lobe. No specific follow-up necessary. Visualized portions of the liver, gallbladder, adrenal glands, kidneys, spleen, pancreas, stomach and bowel are otherwise grossly unremarkable. No upper abdominal adenopathy.   Musculoskeletal: Degenerative changes in the spine.   IMPRESSION: 1. No evidence of interstitial lung disease. Air trapping is indicative of small airways disease. 2. Aortic atherosclerosis (ICD10-I70.0). Coronary artery calcification.     Electronically Signed   By: Leanna Battles M.D.   On: 07/13/2023 11:13

## 2023-09-22 NOTE — Telephone Encounter (Signed)
Spoke with the pt and notified of results/recs per MR  He verbalized understanding  I scheduled him for next available 15 min slot with MR  Nothing further needed

## 2023-11-07 ENCOUNTER — Ambulatory Visit: Payer: Medicare Other | Admitting: Internal Medicine

## 2023-11-07 ENCOUNTER — Encounter: Payer: Self-pay | Admitting: Internal Medicine

## 2023-11-07 VITALS — BP 138/68 | HR 69 | Ht 71.0 in | Wt 282.0 lb

## 2023-11-07 DIAGNOSIS — Z836 Family history of other diseases of the respiratory system: Secondary | ICD-10-CM

## 2023-11-07 DIAGNOSIS — R61 Generalized hyperhidrosis: Secondary | ICD-10-CM | POA: Diagnosis not present

## 2023-11-07 DIAGNOSIS — Z8701 Personal history of pneumonia (recurrent): Secondary | ICD-10-CM

## 2023-11-07 DIAGNOSIS — I2510 Atherosclerotic heart disease of native coronary artery without angina pectoris: Secondary | ICD-10-CM | POA: Diagnosis not present

## 2023-11-07 DIAGNOSIS — Z8709 Personal history of other diseases of the respiratory system: Secondary | ICD-10-CM

## 2023-11-07 NOTE — Patient Instructions (Addendum)
 History of acute respiratory failure - Plan: Ambulatory referral to Cardiology, Pulmonary function test History of pneumococcal pneumonia - Plan: Ambulatory referral to Cardiology, Pulmonary function test Family history of pulmonary fibrosis - Plan: Ambulatory referral to Cardiology, Pulmonary function test   -No evidence of pulmonary fibrosis on the CT scan in October 2024 following a pneumonia episode in May 2024  Plan - Do pulmonary function test in 1 year - Respect no genetic referral - Continue supportive care and particular self with masking around human clusters particularly in the winter and fall -Be up-to-date with respiratory vaccines -  Coronary artery calcification seen on CAT scan -  Diaphoresis -  Plan - Refer Dr. Janne Napoleon at [facility].;  Might need a cardiac stress tst.   Follow-up - 1 year or sooner if needed
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
## 2023-11-21 DIAGNOSIS — M2042 Other hammer toe(s) (acquired), left foot: Secondary | ICD-10-CM | POA: Diagnosis not present

## 2023-11-21 DIAGNOSIS — M2041 Other hammer toe(s) (acquired), right foot: Secondary | ICD-10-CM | POA: Diagnosis not present

## 2023-11-21 DIAGNOSIS — L603 Nail dystrophy: Secondary | ICD-10-CM | POA: Diagnosis not present

## 2023-11-21 DIAGNOSIS — G629 Polyneuropathy, unspecified: Secondary | ICD-10-CM | POA: Diagnosis not present

## 2023-12-07 DIAGNOSIS — M25512 Pain in left shoulder: Secondary | ICD-10-CM | POA: Diagnosis not present

## 2023-12-07 DIAGNOSIS — M5451 Vertebrogenic low back pain: Secondary | ICD-10-CM | POA: Diagnosis not present

## 2023-12-12 DIAGNOSIS — Z125 Encounter for screening for malignant neoplasm of prostate: Secondary | ICD-10-CM | POA: Diagnosis not present

## 2023-12-12 DIAGNOSIS — R7989 Other specified abnormal findings of blood chemistry: Secondary | ICD-10-CM | POA: Diagnosis not present

## 2023-12-12 DIAGNOSIS — D649 Anemia, unspecified: Secondary | ICD-10-CM | POA: Diagnosis not present

## 2023-12-12 DIAGNOSIS — E781 Pure hyperglyceridemia: Secondary | ICD-10-CM | POA: Diagnosis not present

## 2023-12-12 DIAGNOSIS — R7301 Impaired fasting glucose: Secondary | ICD-10-CM | POA: Diagnosis not present

## 2023-12-21 DIAGNOSIS — M5416 Radiculopathy, lumbar region: Secondary | ICD-10-CM | POA: Diagnosis not present

## 2023-12-21 DIAGNOSIS — R82998 Other abnormal findings in urine: Secondary | ICD-10-CM | POA: Diagnosis not present

## 2023-12-21 DIAGNOSIS — J309 Allergic rhinitis, unspecified: Secondary | ICD-10-CM | POA: Diagnosis not present

## 2023-12-21 DIAGNOSIS — K581 Irritable bowel syndrome with constipation: Secondary | ICD-10-CM | POA: Diagnosis not present

## 2023-12-21 DIAGNOSIS — J8410 Pulmonary fibrosis, unspecified: Secondary | ICD-10-CM | POA: Diagnosis not present

## 2023-12-21 DIAGNOSIS — G629 Polyneuropathy, unspecified: Secondary | ICD-10-CM | POA: Diagnosis not present

## 2023-12-21 DIAGNOSIS — R7301 Impaired fasting glucose: Secondary | ICD-10-CM | POA: Diagnosis not present

## 2023-12-21 DIAGNOSIS — Z0000 Encounter for general adult medical examination without abnormal findings: Secondary | ICD-10-CM | POA: Diagnosis not present

## 2023-12-21 DIAGNOSIS — Z860101 Personal history of adenomatous and serrated colon polyps: Secondary | ICD-10-CM | POA: Diagnosis not present

## 2023-12-21 DIAGNOSIS — E785 Hyperlipidemia, unspecified: Secondary | ICD-10-CM | POA: Diagnosis not present

## 2023-12-21 DIAGNOSIS — Z1339 Encounter for screening examination for other mental health and behavioral disorders: Secondary | ICD-10-CM | POA: Diagnosis not present

## 2023-12-21 DIAGNOSIS — I2510 Atherosclerotic heart disease of native coronary artery without angina pectoris: Secondary | ICD-10-CM | POA: Diagnosis not present

## 2023-12-21 DIAGNOSIS — J449 Chronic obstructive pulmonary disease, unspecified: Secondary | ICD-10-CM | POA: Diagnosis not present

## 2023-12-21 DIAGNOSIS — Z1331 Encounter for screening for depression: Secondary | ICD-10-CM | POA: Diagnosis not present

## 2023-12-31 DIAGNOSIS — M5459 Other low back pain: Secondary | ICD-10-CM | POA: Diagnosis not present

## 2023-12-31 DIAGNOSIS — M545 Low back pain, unspecified: Secondary | ICD-10-CM | POA: Diagnosis not present

## 2024-01-06 DIAGNOSIS — M545 Low back pain, unspecified: Secondary | ICD-10-CM | POA: Diagnosis not present

## 2024-01-24 DIAGNOSIS — M47816 Spondylosis without myelopathy or radiculopathy, lumbar region: Secondary | ICD-10-CM | POA: Diagnosis not present

## 2024-01-26 DIAGNOSIS — J441 Chronic obstructive pulmonary disease with (acute) exacerbation: Secondary | ICD-10-CM | POA: Diagnosis not present

## 2024-01-26 DIAGNOSIS — J069 Acute upper respiratory infection, unspecified: Secondary | ICD-10-CM | POA: Diagnosis not present

## 2024-01-26 DIAGNOSIS — R058 Other specified cough: Secondary | ICD-10-CM | POA: Diagnosis not present

## 2024-02-13 DIAGNOSIS — M961 Postlaminectomy syndrome, not elsewhere classified: Secondary | ICD-10-CM | POA: Diagnosis not present

## 2024-02-13 DIAGNOSIS — M47816 Spondylosis without myelopathy or radiculopathy, lumbar region: Secondary | ICD-10-CM | POA: Diagnosis not present

## 2024-03-09 DIAGNOSIS — M545 Low back pain, unspecified: Secondary | ICD-10-CM | POA: Diagnosis not present

## 2024-03-15 DIAGNOSIS — M47816 Spondylosis without myelopathy or radiculopathy, lumbar region: Secondary | ICD-10-CM | POA: Diagnosis not present

## 2024-03-20 DIAGNOSIS — I2510 Atherosclerotic heart disease of native coronary artery without angina pectoris: Secondary | ICD-10-CM | POA: Diagnosis not present

## 2024-03-20 DIAGNOSIS — E785 Hyperlipidemia, unspecified: Secondary | ICD-10-CM | POA: Diagnosis not present

## 2024-03-20 DIAGNOSIS — R7301 Impaired fasting glucose: Secondary | ICD-10-CM | POA: Diagnosis not present

## 2024-04-04 NOTE — Progress Notes (Signed)
 Cardiology Office Note:  .   Date:  04/05/2024  ID:  Jeffrey [name], DOB [birth year], MRN [id number] PCP: Jeffrey [name]., MD  [facility] Providers Cardiologist:  Shelda Bruckner, MD {  History of Present Illness: .   Jeffrey [name] is a 78 y.o. male with PMH pulmonary disease (see below), coronary calcification. He was referred for evaluation by Dr. Loreli for coronary calcification noted on CT scan. I initially met him in 2021 for evaluation of arrhythmia (atrial bigeminy). He is re-referred as a new patient for evaluation of coronary calcification.  Pertinent history: Admitted for CAP, had empyema that failed thoracentesis and chest tube, required VATS in 2009. 2 brothers have history of pulmonary fibrosis, CT in 2012 did not suggest patient had ILD. Admitted 2024 with pneumonia, CT did not show ILD at that time. Did show coronary calcification  Today: Overall he has no current concerns about his heart. Brother has had about 6 mini strokes, currently age 71.   He was noted to have coronary calcification on his CT scan. We discussed what this is, marker for plaque, recommendations for management. Reviewed his images together today.   Limited mobility from chronic joint/nerve pain, but no chest pain or shortness of breath.  Rare sweating episodes, none in last 2-3 months. These have been occurring for years (we discussed at our initial visit in 2021).   Notes leg swelling, unsure how long it has been going on. Feet have been painful, had EMGs to evaluate this, now on gabapentin . Nonlimiting, isn't sure if it is better in the morning. Reviewed recommendations for elevation, salt avoidance. Unfortunately, he lost his taste with Covid and salty food he can taste. No PND or orthopnea.   ROS: Denies chest pain, shortness of breath at rest or with normal exertion. No PND, orthopnea, or unexpected weight gain. No syncope or palpitations. ROS otherwise negative except as noted.    Studies Reviewed: SABRA    EKG:  EKG Interpretation Date/Time:  Thursday April 05 2024 11:17:44 EDT Ventricular Rate:  74 PR Interval:  192 QRS Duration:  74 QT Interval:  366 QTC Calculation: 406 R Axis:   42  Text Interpretation: Normal sinus rhythm with sinus arrhythmia ST elevation, consider early repolarization When compared with ECG of 08-Jan-2023 07:39, No significant change was found Confirmed by Bruckner Shelda [phone]) on 04/05/2024 6:04:20 PM    Physical Exam:   VS:  BP 120/66 (BP Location: Right Arm, Patient Position: Sitting, Cuff Size: Large)   Pulse 85   Ht 5' 11 (1.803 m)   Wt 278 lb 1.6 oz (126.1 kg)   SpO2 92%   BMI 38.79 kg/m    Wt Readings from Last 3 Encounters:  04/05/24 278 lb 1.6 oz (126.1 kg)  11/07/23 282 lb (127.9 kg)  04/27/23 275 lb 8 oz (125 kg)    GEN: Well nourished, well developed in no acute distress HEENT: Normal, moist mucous membranes NECK: No JVD CARDIAC: regular rhythm, normal S1 and S2, no rubs or gallops. No murmur. VASCULAR: Radial and DP pulses 2+ bilaterally. No carotid bruits RESPIRATORY:  Clear to auscultation without rales, wheezing or rhonchi  ABDOMEN: Soft, non-tender, non-distended MUSCULOSKELETAL:  Ambulates independently SKIN: Warm and dry, bilateral firm LE edema with slight color change of skin NEUROLOGIC:  Alert and oriented x 3. No focal neuro deficits noted. PSYCHIATRIC:  Normal affect    ASSESSMENT AND PLAN: .    Coronary calcification Aortic atherosclerosis -lipids from [facility] from  03/20/24: Tchol 95, HDL 39, LDL 17, TG 194 -on rosuvastatin 10 mg daily -we discussed aspirin  today -reviewed what calcification is, how it is a marker, management, and red flag signs that need medical attention  LE edema -appears most consistent with venous insufficiency -discussed salt avoidance, leg elevation -tried compression stockings in the past -if swelling significantly worsens in the future, he will contact me and we  will order echo  CV risk counseling and prevention -recommend heart healthy/Mediterranean diet, with whole grains, fruits, vegetable, fish, lean meats, nuts, and olive oil. Limit salt. -recommend moderate walking, 3-5 times/week for 30-50 minutes each session. Aim for at least 150 minutes.week. Goal should be pace of 3 miles/hours, or walking 1.5 miles in 30 minutes -recommend avoidance of tobacco products. Avoid excess alcohol .  Dispo: we discussed options for follow up, and he prefers to follow up as needed  Signed, Shelda Bruckner, MD   Shelda Bruckner, MD, PhD, [facility] [place]  [facility]  [place]  [facility] at [facility] [address], [address] [place], [place] [address] [phone]

## 2024-04-05 ENCOUNTER — Encounter (HOSPITAL_BASED_OUTPATIENT_CLINIC_OR_DEPARTMENT_OTHER): Payer: Self-pay | Admitting: Cardiology

## 2024-04-05 ENCOUNTER — Ambulatory Visit (HOSPITAL_BASED_OUTPATIENT_CLINIC_OR_DEPARTMENT_OTHER): Admitting: Cardiology

## 2024-04-05 VITALS — BP 120/66 | HR 85 | Ht 71.0 in | Wt 278.1 lb

## 2024-04-05 DIAGNOSIS — Z7189 Other specified counseling: Secondary | ICD-10-CM

## 2024-04-05 DIAGNOSIS — I700 Atherosclerosis of aorta: Secondary | ICD-10-CM | POA: Diagnosis not present

## 2024-04-05 DIAGNOSIS — R600 Localized edema: Secondary | ICD-10-CM | POA: Diagnosis not present

## 2024-04-05 DIAGNOSIS — I2510 Atherosclerotic heart disease of native coronary artery without angina pectoris: Secondary | ICD-10-CM | POA: Diagnosis not present

## 2024-04-05 DIAGNOSIS — Z712 Person consulting for explanation of examination or test findings: Secondary | ICD-10-CM | POA: Diagnosis not present

## 2024-04-05 MED ORDER — ASPIRIN 81 MG PO TBEC: 81.0000 mg | DELAYED_RELEASE_TABLET | Freq: Every day | ORAL | Status: AC

## 2024-04-26 ENCOUNTER — Telehealth: Payer: Self-pay | Admitting: Neurology

## 2024-04-26 ENCOUNTER — Ambulatory Visit: Payer: Medicare Other | Admitting: Neurology

## 2024-04-26 ENCOUNTER — Encounter: Payer: Self-pay | Admitting: Neurology

## 2024-04-26 VITALS — BP 135/82 | HR 63 | Ht 71.0 in | Wt 275.5 lb

## 2024-04-26 DIAGNOSIS — R269 Unspecified abnormalities of gait and mobility: Secondary | ICD-10-CM | POA: Diagnosis not present

## 2024-04-26 DIAGNOSIS — R200 Anesthesia of skin: Secondary | ICD-10-CM

## 2024-04-26 DIAGNOSIS — Z981 Arthrodesis status: Secondary | ICD-10-CM

## 2024-04-26 DIAGNOSIS — R292 Abnormal reflex: Secondary | ICD-10-CM | POA: Diagnosis not present

## 2024-04-26 MED ORDER — ALPRAZOLAM 0.5 MG PO TABS: ORAL_TABLET | ORAL | 0 refills | Status: AC

## 2024-04-26 NOTE — Telephone Encounter (Signed)
 no auth required sent to GI [phone]

## 2024-04-26 NOTE — Progress Notes (Signed)
 [facility]  PATIENT: Jeffrey [name] DOB: Apr 12, 1946  REFERRING DOCTOR OR PCP: Elsie Gentry, MD  SOURCE: Patient, notes from primary care, imaging and laboratory reports, MRI images personally reviewed.  _________________________________   HISTORICAL  CHIEF COMPLAINT:  Chief Complaint  Patient presents with   Follow-up    Pt in room 10.alone. Here for polyneuropathy follow up.  Pt still having pain and numbness in feet.    HISTORY OF PRESENT ILLNESS:  Jeffrey [name] is a 78 y.o. man with neuropathy.  UPDATE 04/27/2023 Currently, he is experiencing tingling in the legs, right > left.    He also has LBP radiating to legs.  MRI lumbar in 2024 showed right   A recent EMG/NCV showed no significant polyneuorpathy and possible mild S1  He reports a little more numbness in his feet   He has pain in the bottom of the feet and altered sensation in his right leg > left leg up to mid shin.   Pain is sharp.    It fluctuates but is always present.   Pain does worse when he walks.  Due to his LB issues he does not walk far in general.  He is on gabapentin  300-300-600 and he tolerates it well.   The pain is better.  Etiology of his polyneuropathy is uncertain.  EMG had shown primarily an axonal pattern.  Blood work for treatable causes of neuropathy was negative.  In 1967 playing football - he was hit hard and paralyzed in arms and legs a few days.  He had C4C5 fusion at that time.   He was able to walk out of the hospital but since then always had he was not at baseline.  He later had C3-C4 fusion in 1990's (Dr. Unice).   He improved as a teenager well enough to play sports casually and ski but he never felt quite back to baseline.     He denies weakness.  He has urinary urgency but no incontinence.    He reports his brother was also found to have polyneuropathy recently and was referred to a neuromuscular specialist at [facility].  He is otherwise healthy.  Recent hemoglobin A1c was  5.3.  Polyneuropathy history and data: He has had pain and numbness in the feet since around 2005.  Symptoms are usually right > left.    Initially he was diagnosed with plantar fasciitis and then polyneuropathy.   He had lumbar surgery 12/07/2019 by Dr. Unice (L4-L5 and L5S2 decompression, with instrumentation and posterior fusion.  Pain in back and legs improved afterwards.  He still has some pain while standing but none while sitting.      NCV/EMG 12/2021 ([facility])   Electrodiagnostic evidence of a predominantly axonal motor peripheral neuropathy, involving the bilateral common peroneal and tibial motor nerves, with secondary demyelinating features noted.    No electrodiagnostic evidence of a peroneal motor entrapment neuropathy at the level of bilateral fibular heads.   No electrodiagnostic evidence of a sural sensory peripheral neuropathy in the bilateral lower extremities.   Labs: 12/21/2021: SPEP, B12, SSA/SSB, rheumatoid factor and copper  were normal or negative  Imaging: MRI of the lumbar spine 10/09/2019 shows mild facet hypertrophy at L3-L4 that does not lead to spinal stenosis or nerve root compression.  At L4-L5, there is trace anterolisthesis and right disc herniation causing moderately severe right foraminal and lateral recess stenosis with potential for right L4 and L5 nerve root compression.  There is also moderate left foraminal and  lateral recess stenosis though there does not appear to be nerve root compression on that side.  At L5-S1, there is l bilateral facet hypertrophy causing mild foraminal and lateral recess stenosis but no nerve root compression.  REVIEW OF SYSTEMS: Constitutional: No fevers, chills, sweats, or change in appetite Eyes: No visual changes, double vision, eye pain Ear, nose and throat: No hearing loss, ear pain, nasal congestion, sore throat Cardiovascular: No chest pain, palpitations Respiratory:  No shortness of breath at rest or with exertion.   No  wheezes GastrointestinaI: No nausea, vomiting, diarrhea, abdominal pain, fecal incontinence Genitourinary:  No dysuria, urinary retention or frequency.  No nocturia. Musculoskeletal:  No neck pain, back pain Integumentary: No rash, pruritus, skin lesions Neurological: as above Psychiatric: No depression at this time.  No anxiety Endocrine: No palpitations, diaphoresis, change in appetite, change in weigh or increased thirst Hematologic/Lymphatic:  No anemia, purpura, petechiae. Allergic/Immunologic: No itchy/runny eyes, nasal congestion, recent allergic reactions, rashes  ALLERGIES: No Known Allergies  HOME MEDICATIONS:  Current Outpatient Medications:    aspirin  EC 81 MG tablet, Take 1 tablet (81 mg total) by mouth daily. Swallow whole., Disp: , Rfl:    B Complex-C (B-COMPLEX WITH VITAMIN C) tablet, Take 1 tablet by mouth in the morning and at bedtime., Disp: , Rfl:    Cholecalciferol  (VITAMIN D3 PO), Take 1 tablet by mouth daily., Disp: , Rfl:    gabapentin  (NEURONTIN ) 300 MG capsule, TAKE 1 CAP EVERY MORNING, 1 CAP EVERY EVENING AND TAKE 2 CAPSULES BY MOUTH AT BEDTIME, Disp: 360 capsule, Rfl: 3   Multiple Vitamin (MULTIVITAMIN WITH MINERALS) TABS tablet, Take 1 tablet by mouth daily. , Disp: , Rfl:    polycarbophil (FIBERCON) 625 MG tablet, Take 625 mg by mouth in the morning and at bedtime. , Disp: , Rfl:    Probiotic Product (PROBIOTIC PO), Take 1 tablet by mouth daily., Disp: , Rfl:    rosuvastatin (CRESTOR) 10 MG tablet, Take 10 mg by mouth daily., Disp: , Rfl:   PAST MEDICAL HISTORY: Past Medical History:  Diagnosis Date   Arthritis    Difficulty sleeping    EMPYEMA 10/01/2008   Pneumonia    RCT (rotator cuff tear)    LEFT    PAST SURGICAL HISTORY: Past Surgical History:  Procedure Laterality Date   BALLOON DILATION  09/01/2011   Procedure: BALLOON DILATION;  Surgeon: Elsie CHRISTELLA Cree, MD;  Location: [facility];  Service: Endoscopy;  Laterality: N/A;    ESOPHAGOGASTRODUODENOSCOPY  09/01/2011   Procedure: ESOPHAGOGASTRODUODENOSCOPY (EGD);  Surgeon: Elsie CHRISTELLA Cree, MD;  Location: [facility];  Service: Endoscopy;  Laterality: N/A;   lumbar disc surgery-2007     LUNG SURGERY     REMOVAL OF EMPYEMA   post neck surgery  1966   post neck surgeryx2     SHOULDER OPEN ROTATOR CUFF REPAIR Left 12/03/2015   Procedure: LEFT SHOULDER MINI OPEN ROTATOR CUFF REPAIR  ;  Surgeon: Reyes Billing, MD;  Location: [facility];  Service: Orthopedics;  Laterality: Left;   thoracic sug. due to pneumonia  dec. 2010   TRANSFORAMINAL LUMBAR INTERBODY FUSION (TLIF) WITH PEDICLE SCREW FIXATION 1 LEVEL N/A 12/07/2019   Procedure: Lumbar Four-Five Transforaminal lumbar interbody fusion;  Surgeon: Unice Pac, MD;  Location[facility];  Service: Neurosurgery;  Laterality: N/A;  Lumbar Four-Five Transforaminal lumbar interbody fusion    FAMILY HISTORY: Family History  Problem Relation Age of Onset   Cancer Mother    Pulmonary fibrosis Mother  Emphysema Father    Pulmonary fibrosis Brother    Lung disease Other     SOCIAL HISTORY:  Social History   Socioeconomic History   Marital status: Married    Spouse name: nancy   Number of children: 2   Years of education: Not on file   Highest education level: Master's degree (e.g., MA, MS, MEng, MEd, MSW, MBA)  Occupational History   Occupation: teaches in driving school  Tobacco Use   Smoking status: Never   Smokeless tobacco: Never  Vaping Use   Vaping status: Never Used  Substance and Sexual Activity   Alcohol  use: Not Currently   Drug use: No   Sexual activity: Yes  Other Topics Concern   Not on file  Social History Narrative   Lives at home w wife   R handed   Caffeine: 2 pepsi cans a day   Social Drivers of Corporate investment banker Strain: Not on file  Food Insecurity: No Food Insecurity (01/10/2023)   Hunger Vital Sign    Worried About Running Out of Food in the Last Year: Never true    Ran Out of  Food in the Last Year: Never true  Transportation Needs: No Transportation Needs (01/10/2023)   PRAPARE - Administrator, Civil Service (Medical): No    Lack of Transportation (Non-Medical): No  Physical Activity: Not on file  Stress: Not on file  Social Connections: Not on file  Intimate Partner Violence: Not At Risk (01/10/2023)   Humiliation, Afraid, Rape, and Kick questionnaire    Fear of Current or Ex-Partner: No    Emotionally Abused: No    Physically Abused: No    Sexually Abused: No     PHYSICAL EXAM  Vitals:   04/26/24 1050 04/26/24 1055  BP: (!) 158/75 135/82  Pulse: 63   Weight: 275 lb 8 oz (125 kg)   Height: 5' 11 (1.803 m)     Body mass index is 38.42 kg/m.   General: The patient is well-developed and well-nourished and in no acute distress  HEENT:  Head is [place]/AT.  Sclera are anicteric.   Skin: Extremities are without rash or  edema.  Musculoskeletal:  Back is nontender  Neurologic Exam  Mental status: The patient is alert and oriented x 3 at the time of the examination. The patient has apparent normal recent and remote memory, with an apparently normal attention span and concentration ability.   Speech is normal.  Cranial nerves: Extraocular movements are full.  Facial strength and sensation was normal.  No dysarthria.. No obvious hearing deficits are noted.  Motor:  Muscle bulk is normal.   Tone is normal. Strength is  5 / 5 in all 4 extremities except 4+/5 EHL muscles bilat. .   Sensory: Sensory testing is intact to pinprick, soft touch and vibration sensation in arms but reduced vibration sensation at ankles and absent at toes.   Reduced pinprick at toes only and pinprick sensation was normal by mid-foot.   .  Coordination: Cerebellar testing reveals good finger-nose-finger and heel-to-shin bilaterally.  Gait and station: Station is normal.   Gait is shuffling and he cannot do a tandem walk.  Romberg is borderline.   Reflexes: Deep  tendon reflexes are symmetric and 3 in arms and knees (L knee > right) and ankles.  Knees spread.  No clonus     DIAGNOSTIC DATA (LABS, IMAGING, TESTING) - I reviewed patient records, labs, notes, testing and imaging myself where  available.  Lab Results  Component Value Date   WBC 10.9 (H) 01/14/2023   HGB 12.1 (L) 01/14/2023   HCT 36.4 (L) 01/14/2023   MCV 96.3 01/14/2023   PLT 295 01/14/2023      Component Value Date/Time   NA 138 01/14/2023 0354   K 4.8 01/14/2023 0354   CL 103 01/14/2023 0354   CO2 29 01/14/2023 0354   GLUCOSE 144 (H) 01/14/2023 0354   BUN 34 (H) 01/14/2023 0354   CREATININE 1.09 01/14/2023 0354   CALCIUM  8.6 (L) 01/14/2023 0354   PROT 7.1 01/08/2023 0254   PROT 6.6 12/21/2021 1105   ALBUMIN 3.8 01/08/2023 0254   AST 30 01/08/2023 0254   ALT 28 01/08/2023 0254   ALKPHOS 31 (L) 01/08/2023 0254   BILITOT 0.6 01/08/2023 0254   GFRNONAA >60 01/14/2023 0354   GFRAA  08/21/2008 0430    >60        The eGFR has been calculated using the MDRD equation. This calculation has not been validated in all clinical   No results found for: CHOL, HDL, LDLCALC, LDLDIRECT, TRIG, CHOLHDL Lab Results  Component Value Date   HGBA1C  08/12/2008    5.5 (NOTE)   The ADA recommends the following therapeutic goal for glycemic   control related to Hgb A1C measurement:   Goal of Therapy:   < 7.0% Hgb A1C   Reference: American Diabetes Association: Clinical Practice   Recommendations 2008, Diabetes Care,  2008, 31:(Suppl 1).   Lab Results  Component Value Date   VITAMINB12 738 12/21/2021   No results found for: TSH     ASSESSMENT AND PLAN  Gait disturbance - Plan: MR CERVICAL SPINE WO CONTRAST  History of fusion of cervical spine - Plan: MR CERVICAL SPINE WO CONTRAST  Numbness - Plan: MR CERVICAL SPINE WO CONTRAST  Hyperreflexia - Plan: MR CERVICAL SPINE WO CONTRAST  Although he appeared to have a  length dependent polyneuropathy, NCV at [facility] did  not how any significant polyneuropathy - therefore I am more concered that numbness n legs due to cervical myelopathy (he has a h/o paralysis and cervical spine fracture - so could be old or could e due to newer DJD).   Continue gabapentin  to 300 mg in the morning, 300 mg in the afternoon and 600 mg at night.  If he tolerates the dose, this can be increased further. Stay active and exercise as tolerated. We discussed safety especially in the dark or when his eyes are closed (as in shower) Return in 12 months or sooner if there are new or worsening neurologic symptoms.  This visit is part of a comprehensive longitudinal care medical relationship regarding the patients primary diagnosis of polyneuropathy and related concerns.  45-minute office visit with the majority of the time spent face-to-face for history and physical, discussion/counseling and decision-making.  Additional time with record review and documentation.   Sherma Vanmetre A. Vear, MD, [facility] 04/26/2024, 11:38 AM Certified in Neurology, Clinical Neurophysiology, Sleep Medicine and Neuroimaging  [facility] [address] [place], [place] [address] [phone]

## 2024-05-14 ENCOUNTER — Ambulatory Visit
Admission: RE | Admit: 2024-05-14 | Discharge: 2024-05-14 | Disposition: A | Discharge status: Home / Self Care | Source: Ambulatory Visit | Attending: Neurology | Admitting: Neurology

## 2024-05-14 ENCOUNTER — Ambulatory Visit: Payer: Self-pay | Admitting: Neurology

## 2024-05-14 DIAGNOSIS — R269 Unspecified abnormalities of gait and mobility: Secondary | ICD-10-CM

## 2024-05-14 DIAGNOSIS — R292 Abnormal reflex: Secondary | ICD-10-CM

## 2024-05-14 DIAGNOSIS — Z981 Arthrodesis status: Secondary | ICD-10-CM | POA: Diagnosis not present

## 2024-05-14 DIAGNOSIS — R200 Anesthesia of skin: Secondary | ICD-10-CM

## 2024-05-14 MED ORDER — LAMOTRIGINE 25 MG PO TABS: ORAL_TABLET | ORAL | 11 refills | Status: AC

## 2024-05-14 NOTE — Telephone Encounter (Signed)
 The MRI of the cervical spine shows myelopathic changes within the spinal cord adjacent to C4-C5.  This level has been fused (posterior).  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  The level below at C5-C6 shows retrolisthesis and other degenerative change but no spinal stenosis  I discussed with him that the myelomalacia scar in the spinal cord is pretty significant and explains the majority of his symptoms.  He continues to have dysesthesias that are only partially controlled by gabapentin  and I will add lamotrigine  to try to get better control.

## 2024-05-15 ENCOUNTER — Other Ambulatory Visit

## 2024-05-23 DIAGNOSIS — M2041 Other hammer toe(s) (acquired), right foot: Secondary | ICD-10-CM | POA: Diagnosis not present

## 2024-05-23 DIAGNOSIS — L603 Nail dystrophy: Secondary | ICD-10-CM | POA: Diagnosis not present

## 2024-05-23 DIAGNOSIS — M2042 Other hammer toe(s) (acquired), left foot: Secondary | ICD-10-CM | POA: Diagnosis not present

## 2024-05-23 DIAGNOSIS — G629 Polyneuropathy, unspecified: Secondary | ICD-10-CM | POA: Diagnosis not present

## 2024-08-29 ENCOUNTER — Other Ambulatory Visit: Payer: Self-pay | Admitting: Neurology
